# Patient Record
Sex: Female | Born: 1985 | Race: White | Hispanic: No | Marital: Married | Smoking: Never smoker
Health system: Southern US, Community
[De-identification: ages and names within clinical notes are randomized; demographics above are authoritative.]

## PROBLEM LIST (undated history)

## (undated) DIAGNOSIS — N200 Calculus of kidney: Secondary | ICD-10-CM

## (undated) DIAGNOSIS — R102 Pelvic and perineal pain: Secondary | ICD-10-CM

## (undated) DIAGNOSIS — Z87448 Personal history of other diseases of urinary system: Secondary | ICD-10-CM

## (undated) DIAGNOSIS — F419 Anxiety disorder, unspecified: Secondary | ICD-10-CM

## (undated) DIAGNOSIS — Z87440 Personal history of urinary (tract) infections: Secondary | ICD-10-CM

## (undated) DIAGNOSIS — D649 Anemia, unspecified: Secondary | ICD-10-CM

## (undated) DIAGNOSIS — G8929 Other chronic pain: Secondary | ICD-10-CM

## (undated) DIAGNOSIS — F32A Depression, unspecified: Secondary | ICD-10-CM

---
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
## 2007-01-05 ENCOUNTER — Other Ambulatory Visit: Admission: RE | Admit: 2007-01-05 | Discharge: 2007-01-05 | Payer: Self-pay | Admitting: Gynecology

## 2007-12-17 HISTORY — PX: AUGMENTATION MAMMAPLASTY: SUR837

## 2008-01-06 ENCOUNTER — Other Ambulatory Visit: Admission: RE | Admit: 2008-01-06 | Discharge: 2008-01-06 | Payer: Self-pay | Admitting: Gynecology

## 2010-03-15 ENCOUNTER — Emergency Department (HOSPITAL_COMMUNITY): Admission: EM | Admit: 2010-03-15 | Discharge: 2010-03-16 | Payer: Self-pay | Admitting: Emergency Medicine

## 2011-01-05 ENCOUNTER — Encounter: Payer: Self-pay | Admitting: Family Medicine

## 2011-05-03 NOTE — Op Note (Signed)
NAME:  Stacey Houston, Stacey Houston            ACCOUNT NO.:  [id number]   MEDICAL RECORD NO.:  [id number]          PATIENT TYPE:  AMB   LOCATION:  DAY                           FACILITY:  [facility]   PHYSICIAN:  Ky Barban, M.D.DATE OF BIRTH:  [birth year]   DATE OF PROCEDURE:  DATE OF DISCHARGE:                                 OPERATIVE REPORT   PREOPERATIVE DIAGNOSIS:  Frequent cystitis.   POSTOPERATIVE DIAGNOSIS:  Urethritis, urethral stenosis.   OPERATION PERFORMED:  Cystodilation.   SURGEON:  Ky Barban, M.D.   ANESTHESIA:   DESCRIPTION OF PROCEDURE:  The patient was placed in high lithotomy position  under general endotracheal anesthesia after the usual prep and drape. A #17  cystoscope introduced into the bladder to thoroughly inspect it.  No  evidence of any tumor or stone, foreign body or inflammation was seen.  Both  orifices located at normal site.  They were normal in location and shape.  Ureter showed some chronic inflammatory changes and calibrated to 17 [place].  Cystoscope was removed.  Urethra was dilated to 28 [place].  Patient left the  procedure room in satisfactory condition.     Moha   MIJ/MEDQ  D:  11/09/2004  T:  11/09/2004  Job:  [id number]

## 2011-05-03 NOTE — H&P (Signed)
Stacey Houston, Stacey Houston            ACCOUNT NO.:  [id number]   MEDICAL RECORD NO.:  [id number]          PATIENT TYPE:  AMB   LOCATION:  DAY                           FACILITY:  [facility]   PHYSICIAN:  Ky Barban, M.D.DATE OF BIRTH:  [birth year]   DATE OF ADMISSION:  11/09/2004  DATE OF DISCHARGE:  LH                                HISTORY & PHYSICAL   CHIEF COMPLAINT:  Recurrent cystitis.   HISTORY:  A 25 year old female was seen in the office on November 3,  complains of terminal dysuria, frequency.  No nocturia, hematuria, fever or  chills.  She had three episodes in the last three months, treated with  antibiotics.  As soon as she stops antibiotics, she has a problem again.  So  I have advised her to undergo cystoscopy, possible dilation under anesthesia  as an outpatient.   PAST MEDICAL HISTORY:  Essentially negative.   SOCIAL HISTORY:  She does not smoke or drink.   REVIEW OF SYSTEMS:  Unremarkable.   PHYSICAL EXAMINATION:  VITAL SIGNS: Blood pressure 112/78, temperature 98.  CENTRAL NERVOUS SYSTEM:  Negative.  HEAD AND NECK:  Negative.  CHEST:   PHYSICAL EXAMINATION:  VITAL SIGNS:  Blood pressure 130/90, temperature  98.4.  CENTRAL NERVOUS SYSTEM:  Negative.  HEAD AND NECK:  Negative.  CHEST:  Symmetrical.  HEART:  Regular sinus rhythm.  ABDOMEN:  Soft, flat.  Liver, spleen, and kidneys not palpable.  No CVA  tenderness.  PELVIC:  On inspection looks normal.   IMPRESSION:  Recurrent cystitis.   PLAN:  Cystoscopy, dilation under anesthesia as an outpatient.     Moha   MIJ/MEDQ  D:  11/06/2004  T:  11/06/2004  Job:  [id number]   cc:   [facility]  Fax: [phone]

## 2011-05-06 ENCOUNTER — Ambulatory Visit (HOSPITAL_COMMUNITY)
Admission: RE | Admit: 2011-05-06 | Discharge: 2011-05-06 | Disposition: A | Discharge status: Home / Self Care | Payer: PRIVATE HEALTH INSURANCE | Source: Ambulatory Visit | Attending: Internal Medicine | Admitting: Internal Medicine

## 2011-05-06 ENCOUNTER — Other Ambulatory Visit (HOSPITAL_COMMUNITY): Payer: Self-pay | Admitting: Internal Medicine

## 2011-05-06 DIAGNOSIS — R1031 Right lower quadrant pain: Secondary | ICD-10-CM

## 2011-05-06 DIAGNOSIS — N200 Calculus of kidney: Secondary | ICD-10-CM | POA: Insufficient documentation

## 2014-08-28 ENCOUNTER — Encounter (HOSPITAL_COMMUNITY): Payer: Self-pay | Admitting: Emergency Medicine

## 2014-08-28 ENCOUNTER — Emergency Department (HOSPITAL_COMMUNITY)
Admission: EM | Admit: 2014-08-28 | Discharge: 2014-08-28 | Disposition: A | Discharge status: Home / Self Care | Payer: PRIVATE HEALTH INSURANCE | Attending: Emergency Medicine | Admitting: Emergency Medicine

## 2014-08-28 DIAGNOSIS — Z880 Allergy status to penicillin: Secondary | ICD-10-CM | POA: Diagnosis not present

## 2014-08-28 DIAGNOSIS — N390 Urinary tract infection, site not specified: Secondary | ICD-10-CM | POA: Diagnosis not present

## 2014-08-28 DIAGNOSIS — R109 Unspecified abdominal pain: Secondary | ICD-10-CM | POA: Diagnosis present

## 2014-08-28 DIAGNOSIS — Z79899 Other long term (current) drug therapy: Secondary | ICD-10-CM | POA: Insufficient documentation

## 2014-08-28 LAB — URINE MICROSCOPIC-ADD ON

## 2014-08-28 LAB — URINALYSIS, ROUTINE W REFLEX MICROSCOPIC
Bilirubin Urine: NEGATIVE
Glucose, UA: NEGATIVE mg/dL
Ketones, ur: 15 mg/dL — AB
NITRITE: NEGATIVE
PROTEIN: NEGATIVE mg/dL
Specific Gravity, Urine: 1.023 (ref 1.005–1.030)
Urobilinogen, UA: 0.2 mg/dL (ref 0.0–1.0)
pH: 6 (ref 5.0–8.0)

## 2014-08-28 MED ORDER — FLUCONAZOLE 200 MG PO TABS: 200.0000 mg | ORAL_TABLET | Freq: Every day | ORAL | Status: AC

## 2014-08-28 MED ORDER — CEPHALEXIN 500 MG PO CAPS: 500.0000 mg | ORAL_CAPSULE | Freq: Two times a day (BID) | ORAL | Status: DC

## 2014-08-28 NOTE — ED Provider Notes (Signed)
CSN: [id number]     Arrival date & time 08/28/14  7425 History   First MD Initiated Contact with Patient 08/28/14 [phone]     Chief Complaint  Patient presents with  . Abdominal Pain      HPI Stacey Houston is a 28 y.o. female with PMH of persistent UTI in past presenting with severe suprapubic pain with burning after urination this morning. Patient denies hematuria or frequency. Patient has urinated since without the severe pain. Patient still has pain but it is mild. No back pain or flank pain or fevers. Patient has seen urology before and chronically has hematuria. Last time she was seen by them was 3 years ago and hasn't had UTI since. No pelvic pain or discharge. Mild headache that developed gradually. No lightheadedness or dizziness and patient ambulates normally.    History reviewed. No pertinent past medical history. History reviewed. No pertinent past surgical history. No family history on file. History  Substance Use Topics  . Smoking status: Never Smoker   . Smokeless tobacco: Not on file  . Alcohol Use: No   OB History   Grav Para Term Preterm Abortions TAB SAB Ect Mult Living                 Review of Systems  Constitutional: Negative for fever and chills.  HENT: Negative for congestion and rhinorrhea.   Eyes: Negative for visual disturbance.  Respiratory: Negative for cough and shortness of breath.   Cardiovascular: Negative for chest pain and palpitations.  Gastrointestinal: Negative for nausea, vomiting and diarrhea.  Genitourinary: Positive for dysuria. Negative for hematuria and flank pain.  Musculoskeletal: Negative for back pain and gait problem.  Skin: Negative for rash.  Neurological: Negative for weakness and headaches.      Allergies  Amoxicillin; Macrobid; and Sulfur  Home Medications   Prior to Admission medications   Medication Sig Start Date End Date Taking? Authorizing Provider  levonorgestrel-ethinyl estradiol  (SEASONALE,INTROVALE,JOLESSA) 0.15-0.03 MG tablet Take 1 tablet by mouth daily.   Yes Historical Provider, MD  cephALEXin (KEFLEX) 500 MG capsule Take 1 capsule (500 mg total) by mouth 2 (two) times daily. 08/28/14   Louann Sjogren, PA-C  fluconazole (DIFLUCAN) 200 MG tablet Take 1 tablet (200 mg total) by mouth daily. 08/28/14 09/04/14  Louann Sjogren, PA-C   BP 108/65  Pulse 89  Temp(Src) 97.9 F (36.6 C)  Resp 16  Ht 5' 4.5" (1.638 m)  Wt 128 lb (58.06 kg)  BMI 21.64 kg/m2  SpO2 98% Physical Exam  Nursing note and vitals reviewed. Constitutional: She appears well-developed and well-nourished. No distress.  HENT:  Head: Normocephalic and atraumatic.  Eyes: Conjunctivae and EOM are normal. Right eye exhibits no discharge. Left eye exhibits no discharge. No scleral icterus.  Cardiovascular: Normal rate, regular rhythm and normal heart sounds.   Pulmonary/Chest: Effort normal and breath sounds normal. No respiratory distress. She has no wheezes.  Abdominal: Soft. Bowel sounds are normal. She exhibits no distension.  Suprapubic tenderness to palpation without rebound or guarding.   Musculoskeletal: Normal range of motion. She exhibits no tenderness.  No midline, L or R sided back pain. No CVA tenderness.  Neurological: She is alert. She exhibits normal muscle tone. Coordination normal.  Patient ambulates normally.  Skin: Skin is warm and dry. She is not diaphoretic.  Psychiatric: She has a normal mood and affect. Her behavior is normal.    ED Course  Procedures (including critical care time) Labs Review  Labs Reviewed  URINALYSIS, ROUTINE W REFLEX MICROSCOPIC - Abnormal; Notable for the following:    Color, Urine AMBER (*)    APPearance CLOUDY (*)    Hgb urine dipstick MODERATE (*)    Ketones, ur 15 (*)    Leukocytes, UA MODERATE (*)    All other components within normal limits  URINE MICROSCOPIC-ADD ON - Abnormal; Notable for the following:    Squamous Epithelial / LPF FEW  (*)    Bacteria, UA MANY (*)    All other components within normal limits  POC URINE PREG, ED    Imaging Review No results found.   EKG Interpretation None     Meds given in ED:  Medications - No data to display  New Prescriptions   CEPHALEXIN (KEFLEX) 500 MG CAPSULE    Take 1 capsule (500 mg total) by mouth 2 (two) times daily.   FLUCONAZOLE (DIFLUCAN) 200 MG TABLET    Take 1 tablet (200 mg total) by mouth daily.      MDM   Final diagnoses:  UTI (lower urinary tract infection)   Pt has been diagnosed with a UTI. VSS. Pt is afebrile, no CVA tenderness, normotensive, and denies N/V. Pt to be dc home with antibiotics and fluconazole and instructions to follow up with PCP if symptoms persist.  Discussed return precautions with patient. Discussed all results and patient verbalizes understanding and agrees with plan.  Case has been discussed with Dr. Ethelda Chick who agrees with the above plan to discharge.      Louann Sjogren, PA-C 08/28/14 1058

## 2014-08-28 NOTE — Discharge Instructions (Signed)
Return to the emergency room with worsening of symptoms, new symptoms or with symptoms that are concerning, especially fevers or severe abdominal or back pain.  Please call your doctor for a followup appointment within 24-48 hours. When you talk to your doctor please let them know that you were seen in the emergency department and have them acquire all of your records so that they can discuss the findings with you and formulate a treatment plan to fully care for your new and ongoing problems. Please take all of your antibiotics until finished!   You may develop abdominal discomfort or diarrhea from the antibiotic.  You may help offset this with probiotics which you can buy or get in yogurt. Do not eat  or take the probiotics until 2 hours after your antibiotic.

## 2014-08-28 NOTE — ED Notes (Signed)
Pt. Stated, I went to pee this morning and my bladder started hurting and now I don't want to pee

## 2014-08-28 NOTE — ED Provider Notes (Signed)
Medical screening examination/treatment/procedure(s) were performed by non-physician practitioner and as supervising physician I was immediately available for consultation/collaboration.   EKG Interpretation None       Doug Sou, MD 08/28/14 1652

## 2014-11-05 ENCOUNTER — Emergency Department (HOSPITAL_COMMUNITY): Payer: No Typology Code available for payment source

## 2014-11-05 ENCOUNTER — Emergency Department (HOSPITAL_COMMUNITY)
Admission: EM | Admit: 2014-11-05 | Discharge: 2014-11-05 | Disposition: A | Discharge status: Home / Self Care | Payer: No Typology Code available for payment source | Attending: Emergency Medicine | Admitting: Emergency Medicine

## 2014-11-05 ENCOUNTER — Encounter (HOSPITAL_COMMUNITY): Payer: Self-pay | Admitting: Emergency Medicine

## 2014-11-05 DIAGNOSIS — Y998 Other external cause status: Secondary | ICD-10-CM | POA: Diagnosis not present

## 2014-11-05 DIAGNOSIS — Y92410 Unspecified street and highway as the place of occurrence of the external cause: Secondary | ICD-10-CM | POA: Insufficient documentation

## 2014-11-05 DIAGNOSIS — Z880 Allergy status to penicillin: Secondary | ICD-10-CM | POA: Insufficient documentation

## 2014-11-05 DIAGNOSIS — S3992XA Unspecified injury of lower back, initial encounter: Secondary | ICD-10-CM | POA: Diagnosis present

## 2014-11-05 DIAGNOSIS — Y9389 Activity, other specified: Secondary | ICD-10-CM | POA: Diagnosis not present

## 2014-11-05 DIAGNOSIS — S39012A Strain of muscle, fascia and tendon of lower back, initial encounter: Secondary | ICD-10-CM | POA: Diagnosis not present

## 2014-11-05 DIAGNOSIS — Z3202 Encounter for pregnancy test, result negative: Secondary | ICD-10-CM | POA: Diagnosis not present

## 2014-11-05 DIAGNOSIS — Z79899 Other long term (current) drug therapy: Secondary | ICD-10-CM | POA: Diagnosis not present

## 2014-11-05 DIAGNOSIS — Z792 Long term (current) use of antibiotics: Secondary | ICD-10-CM | POA: Diagnosis not present

## 2014-11-05 LAB — POC URINE PREG, ED: Preg Test, Ur: NEGATIVE

## 2014-11-05 MED ORDER — CYCLOBENZAPRINE HCL 5 MG PO TABS: 5.0000 mg | ORAL_TABLET | Freq: Three times a day (TID) | ORAL | Status: DC | PRN

## 2014-11-05 MED ORDER — HYDROCODONE-ACETAMINOPHEN 5-325 MG PO TABS: 1.0000 | ORAL_TABLET | ORAL | Status: DC | PRN
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
## 2015-01-10 ENCOUNTER — Encounter: Payer: Self-pay | Admitting: Family Medicine

## 2015-01-10 ENCOUNTER — Ambulatory Visit (INDEPENDENT_AMBULATORY_CARE_PROVIDER_SITE_OTHER): Payer: PRIVATE HEALTH INSURANCE | Admitting: Family Medicine

## 2015-01-10 VITALS — BP 100/60 | HR 86 | Temp 98.5°F | Ht 65.0 in | Wt 140.5 lb

## 2015-01-10 DIAGNOSIS — Z13220 Encounter for screening for lipoid disorders: Secondary | ICD-10-CM

## 2015-01-10 DIAGNOSIS — K5900 Constipation, unspecified: Secondary | ICD-10-CM

## 2015-01-10 HISTORY — DX: Constipation, unspecified: K59.00

## 2015-01-10 LAB — CBC WITH DIFFERENTIAL/PLATELET
BASOS PCT: 0.4 % (ref 0.0–3.0)
Basophils Absolute: 0 10*3/uL (ref 0.0–0.1)
EOS ABS: 0.2 10*3/uL (ref 0.0–0.7)
Eosinophils Relative: 1.9 % (ref 0.0–5.0)
HEMATOCRIT: 42.1 % (ref 36.0–46.0)
Hemoglobin: 14.2 g/dL (ref 12.0–15.0)
LYMPHS ABS: 2.6 10*3/uL (ref 0.7–4.0)
LYMPHS PCT: 23.4 % (ref 12.0–46.0)
MCHC: 33.8 g/dL (ref 30.0–36.0)
MCV: 89.8 fl (ref 78.0–100.0)
MONO ABS: 0.5 10*3/uL (ref 0.1–1.0)
Monocytes Relative: 4.9 % (ref 3.0–12.0)
Neutro Abs: 7.6 10*3/uL (ref 1.4–7.7)
Neutrophils Relative %: 69.4 % (ref 43.0–77.0)
PLATELETS: 384 10*3/uL (ref 150.0–400.0)
RBC: 4.69 Mil/uL (ref 3.87–5.11)
RDW: 13.3 % (ref 11.5–15.5)
WBC: 11 10*3/uL — AB (ref 4.0–10.5)

## 2015-01-10 LAB — COMPREHENSIVE METABOLIC PANEL
ALT: 11 U/L (ref 0–35)
AST: 13 U/L (ref 0–37)
Albumin: 4 g/dL (ref 3.5–5.2)
Alkaline Phosphatase: 44 U/L (ref 39–117)
BUN: 10 mg/dL (ref 6–23)
CHLORIDE: 105 meq/L (ref 96–112)
CO2: 25 meq/L (ref 19–32)
CREATININE: 0.7 mg/dL (ref 0.40–1.20)
Calcium: 9.1 mg/dL (ref 8.4–10.5)
GFR: 106.99 mL/min (ref >60.00)
GLUCOSE: 89 mg/dL (ref 70–99)
Potassium: 3.9 mEq/L (ref 3.5–5.1)
Sodium: 138 mEq/L (ref 135–145)
Total Bilirubin: 0.6 mg/dL (ref 0.2–1.2)
Total Protein: 6.9 g/dL (ref 6.0–8.3)

## 2015-01-10 LAB — TSH: TSH: 1.23 u[IU]/mL (ref 0.35–4.50)

## 2015-01-10 LAB — LIPID PANEL
CHOLESTEROL: 174 mg/dL (ref 0–200)
HDL: 55.6 mg/dL (ref >39.00)
LDL CALC: 98 mg/dL (ref 0–99)
NONHDL: 118.4
Total CHOL/HDL Ratio: 3
Triglycerides: 102 mg/dL (ref 0.0–149.0)
VLDL: 20.4 mg/dL (ref 0.0–40.0)

## 2015-01-10 LAB — T4, FREE: Free T4: 0.86 ng/dL (ref 0.60–1.60)

## 2015-01-10 LAB — T3, FREE: T3, Free: 3.8 pg/mL (ref 2.3–4.2)

## 2015-01-10 NOTE — Progress Notes (Signed)
Subjective:   Patient ID: Stacey Houston, female    DOB: Sep 28, 1986, 29 y.o.   MRN: [id number]  Stacey Houston is a pleasant 29 y.o. year old female who presents to clinic today with Establish Care  on 01/10/2015  HPI: Constipation- ongoing issue for several years.  Has tried probiotics and fiber without much improvement.  Used to have at least one BM per day, now can go days without having a BM and usually has to strain. No blood or mucous in stool. No abdominal pain. Not very physically active but does try to drink water all day at work.  No new rx- on OCPs (sees GYN) but has been since she was 29 yo.  She is thinking about coming off OCPS soon as she and her husband want to start a family.  Does have FH of IBS.  No known FH of thyroid dysfunction or IBD.  Current Outpatient Prescriptions on File Prior to Visit  Medication Sig Dispense Refill  . levonorgestrel-ethinyl estradiol (SEASONALE,INTROVALE,JOLESSA) 0.15-0.03 MG tablet Take 1 tablet by mouth daily.    . cyclobenzaprine (FLEXERIL) 5 MG tablet Take 1 tablet (5 mg total) by mouth 3 (three) times daily as needed for muscle spasms. (Patient not taking: Reported on 01/10/2015) 15 tablet 0  . HYDROcodone-acetaminophen (NORCO/VICODIN) 5-325 MG per tablet Take 1 tablet by mouth every 4 (four) hours as needed. (Patient not taking: Reported on 01/10/2015) 15 tablet 0   No current facility-administered medications on file prior to visit.    Allergies  Allergen Reactions  . Amoxicillin Hives  . Macrobid [employer][Nitrofurantoin Monohyd Macro] Other (See Comments)    Chest pain  . Penicillins     Hives   . Sulfur     childhood    History reviewed. No pertinent past medical history.  Past Surgical History  Procedure Laterality Date  . Augmentation mammaplasty      History reviewed. No pertinent family history.  History   Social History  . Marital Status: Married    Spouse Name: N/A    Number of Children: N/A  .  Years of Education: N/A   Occupational History  . Not on file.   Social History Main Topics  . Smoking status: Never Smoker   . Smokeless tobacco: Never Used  . Alcohol Use: No  . Drug Use: No  . Sexual Activity: Yes    Birth Control/ Protection: Pill   Other Topics Concern  . Not on file   Social History Narrative   The PMH, PSH, Social History, Family History, Medications, and allergies [facility], and have been updated if relevant.     Review of Systems  Constitutional: Positive for unexpected weight change. Negative for fever, appetite change and fatigue.  Respiratory: Negative.   Cardiovascular: Negative.   Gastrointestinal: Positive for constipation. Negative for nausea, vomiting, abdominal pain, diarrhea, blood in stool, abdominal distention, anal bleeding and rectal pain.  Endocrine: Negative for cold intolerance, heat intolerance, polydipsia, polyphagia and polyuria.  Genitourinary: Negative.   Musculoskeletal: Negative.   Skin: Negative.   Neurological: Negative.   Hematological: Negative.   Psychiatric/Behavioral: Negative.   All other systems reviewed and are negative.      Objective:    BP 100/60 mmHg  Pulse 86  Temp(Src) 98.5 F (36.9 C) (Oral)  Ht 5\' 5"  (1.651 m)  Wt 140 lb 8 oz (63.73 kg)  BMI 23.38 kg/m2  LMP 12/20/2014   Physical Exam  Constitutional: She is oriented  to person, place, and time. She appears well-developed and well-nourished. No distress.  HENT:  Head: Normocephalic and atraumatic.  Eyes: Pupils are equal, round, and reactive to light.  Neck: Normal range of motion. Neck supple. No thyromegaly present.  Cardiovascular: Normal rate and regular rhythm.   Pulmonary/Chest: Effort normal.  Abdominal: Soft. Bowel sounds are normal. She exhibits no distension and no mass. There is no tenderness. There is no rebound and no guarding.  Musculoskeletal: Normal range of motion.  Neurological: She is alert and oriented to  person, place, and time. No cranial nerve deficit.  Skin: Skin is warm and dry.  Psychiatric: She has a normal mood and affect. Her behavior is normal. Judgment and thought content normal.  Nursing note and vitals reviewed.         Assessment & Plan:   Constipation, unspecified constipation type - Plan: T4, Free, TSH, T3, Free, Comprehensive metabolic panel, CBC with Differential/Platelet  Screening, lipid - Plan: Lipid panel No Follow-up on file.

## 2015-01-10 NOTE — Progress Notes (Signed)
Pre visit review using our clinic review tool, if applicable. No additional management support is needed unless otherwise documented below in the visit note. 

## 2015-01-10 NOTE — Patient Instructions (Signed)
Great to meet you. Please start taking Miralax over the counter- 17 grams daily or every other day.  Ok to go slow. We will call you with your lab results and you can view them online.

## 2015-01-10 NOTE — Assessment & Plan Note (Signed)
New- persistent. ?IBS with constipation.  No red flag symptoms. >25 minutes spent in face to face time with patient, >50% spent in counselling or coordination of care Discussed different types of constipation with pt.  Advised that she STOP fiber supplement as it can worsen constipation in certain circumstances, particularly without adequate hydration. Check labs today. Start miralax- 17 g daily or every other day- start slow. Call or return to clinic prn if these symptoms worsen or fail to improve as anticipated. The patient indicates understanding of these issues and agrees with the plan.  Orders Placed This Encounter  Procedures  . T4, Free  . TSH  . T3, Free  . Comprehensive metabolic panel  . CBC with Differential/Platelet  . Lipid panel

## 2015-01-11 ENCOUNTER — Encounter: Payer: Self-pay | Admitting: *Deleted
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
## 2016-02-27 ENCOUNTER — Telehealth: Payer: Self-pay

## 2016-02-27 NOTE — Telephone Encounter (Signed)
Pt left v/m requesting rx for sea sick patch; pt is going on cruise. Left v/m requesting cb.

## 2016-03-16 HISTORY — PX: WISDOM TOOTH EXTRACTION: SHX21

## 2016-04-04 NOTE — Telephone Encounter (Signed)
I spoke with pt and she did not need the patches; pt has already been on cruise and had no problem. Advised pt had requested cb to see how many days would be at sea to determine quantity sent to pharmacy. Pt voiced understanding but said that was OK.

## 2017-08-01 ENCOUNTER — Encounter (HOSPITAL_BASED_OUTPATIENT_CLINIC_OR_DEPARTMENT_OTHER): Payer: Self-pay | Admitting: *Deleted

## 2017-08-01 NOTE — Progress Notes (Signed)
NPO AFTER MN.  ARRIVE AT 0600.  NEEDS URINE PREG.  GETTING LAB WORK DONE PRIOR TO DOS.  ( CBC, CMET)

## 2017-08-07 DIAGNOSIS — R102 Pelvic and perineal pain: Secondary | ICD-10-CM | POA: Diagnosis not present

## 2017-08-07 DIAGNOSIS — N941 Unspecified dyspareunia: Secondary | ICD-10-CM | POA: Diagnosis not present

## 2017-08-07 DIAGNOSIS — N946 Dysmenorrhea, unspecified: Secondary | ICD-10-CM | POA: Diagnosis present

## 2017-08-07 DIAGNOSIS — N803 Endometriosis of pelvic peritoneum: Secondary | ICD-10-CM | POA: Diagnosis not present

## 2017-08-07 DIAGNOSIS — G8929 Other chronic pain: Secondary | ICD-10-CM | POA: Diagnosis not present

## 2017-08-07 LAB — COMPREHENSIVE METABOLIC PANEL
ALK PHOS: 52 U/L (ref 38–126)
ALT: 16 U/L (ref 14–54)
AST: 17 U/L (ref 15–41)
Albumin: 4.7 g/dL (ref 3.5–5.0)
Anion gap: 7 (ref 5–15)
BUN: 9 mg/dL (ref 6–20)
CALCIUM: 9.2 mg/dL (ref 8.9–10.3)
CO2: 25 mmol/L (ref 22–32)
CREATININE: 0.63 mg/dL (ref 0.44–1.00)
Chloride: 106 mmol/L (ref 101–111)
Glucose, Bld: 86 mg/dL (ref 65–99)
Potassium: 3.3 mmol/L — ABNORMAL LOW (ref 3.5–5.1)
Sodium: 138 mmol/L (ref 135–145)
TOTAL PROTEIN: 7.3 g/dL (ref 6.5–8.1)
Total Bilirubin: 0.9 mg/dL (ref 0.3–1.2)

## 2017-08-07 LAB — CBC
HCT: 38.4 % (ref 36.0–46.0)
Hemoglobin: 13.3 g/dL (ref 12.0–15.0)
MCH: 30.1 pg (ref 26.0–34.0)
MCHC: 34.6 g/dL (ref 30.0–36.0)
MCV: 86.9 fL (ref 78.0–100.0)
PLATELETS: 318 10*3/uL (ref 150–400)
RBC: 4.42 MIL/uL (ref 3.87–5.11)
RDW: 12.1 % (ref 11.5–15.5)
WBC: 9.6 10*3/uL (ref 4.0–10.5)

## 2017-08-10 NOTE — H&P (Signed)
Stacey Houston is an 31 y.o. female with severe dysmenorrhea and dyspareunia which has worsened since coming off OCPs; attempting to conceive since October.  The patient was recently diagnosed with IC by urology and has noticed improvement with diet and medication.  She desires definitive diagnosis.  Pertinent Gynecological History: Menses: flow is moderate Bleeding: regular Contraception: none DES exposure: unknown Blood transfusions: none Sexually transmitted diseases: no past history Previous GYN Procedures: n/a  Last mammogram: none Date: n/a Last pap: normal Date: 02/2017 OB History: G0   Menstrual History: Menarche age: n/a Patient's last menstrual period was 07/07/2017 (approximate).    Past Medical History:  Diagnosis Date  . Chronic pelvic pain in female   . H/O chronic cystitis   . History of recurrent UTIs   . History of urethral stricture   . Nephrolithiasis    bilateral non-obstructive per urologist note 06-16-2017  -- dr Marlou Porch    Past Surgical History:  Procedure Laterality Date  . AUGMENTATION MAMMAPLASTY Bilateral 2009  . CYSTO/  URETHRAL DILATION  11/09/2004  . WISDOM TOOTH EXTRACTION  03/2016    History reviewed. No pertinent family history.  Social History:  reports that she has never smoked. She has never used smokeless tobacco. She reports that she does not drink alcohol or use drugs.  Allergies:  Allergies  Allergen Reactions  . Amoxicillin Hives  . Penicillins Hives  . Sulfa Antibiotics Hives    No prescriptions prior to admission.    ROS  Height 5' 4.5" (1.638 m), weight 128 lb (58.1 kg), last menstrual period 07/07/2017. Physical Exam  Constitutional: She is oriented to person, place, and time. She appears well-developed and well-nourished.  GI: Soft. There is no rebound and no guarding.  Neurological: She is alert and oriented to person, place, and time.  Skin: Skin is warm and dry.  Psychiatric: She has a normal mood and  affect. Her behavior is normal.    No results found for this or any previous visit (from the past 24 hour(s)).  No results found.  Assessment/Plan: Chronic pelvic pain Dx L/S, possible removal of endometriosis, chromopertubation Patient has been counseled re: risk of bleeding, infection, scarring, and damage to surrounding structures.  All questions were answered and the patient wishes to proceed.  Stacey Houston 08/10/2017, 9:12 PM

## 2017-08-12 ENCOUNTER — Ambulatory Visit (HOSPITAL_BASED_OUTPATIENT_CLINIC_OR_DEPARTMENT_OTHER): Payer: 59 | Admitting: Certified Registered"

## 2017-08-12 ENCOUNTER — Encounter (HOSPITAL_BASED_OUTPATIENT_CLINIC_OR_DEPARTMENT_OTHER)
Admission: RE | Disposition: A | Discharge status: Home / Self Care | Payer: Self-pay | Source: Ambulatory Visit | Attending: Obstetrics & Gynecology

## 2017-08-12 ENCOUNTER — Ambulatory Visit (HOSPITAL_BASED_OUTPATIENT_CLINIC_OR_DEPARTMENT_OTHER)
Admission: RE | Admit: 2017-08-12 | Discharge: 2017-08-12 | Disposition: A | Discharge status: Home / Self Care | Payer: 59 | Source: Ambulatory Visit | Attending: Obstetrics & Gynecology | Admitting: Obstetrics & Gynecology

## 2017-08-12 ENCOUNTER — Encounter (HOSPITAL_BASED_OUTPATIENT_CLINIC_OR_DEPARTMENT_OTHER): Payer: Self-pay | Admitting: *Deleted

## 2017-08-12 DIAGNOSIS — G8929 Other chronic pain: Secondary | ICD-10-CM | POA: Insufficient documentation

## 2017-08-12 DIAGNOSIS — R102 Pelvic and perineal pain: Secondary | ICD-10-CM | POA: Insufficient documentation

## 2017-08-12 DIAGNOSIS — N941 Unspecified dyspareunia: Secondary | ICD-10-CM | POA: Insufficient documentation

## 2017-08-12 DIAGNOSIS — N946 Dysmenorrhea, unspecified: Secondary | ICD-10-CM | POA: Insufficient documentation

## 2017-08-12 DIAGNOSIS — N803 Endometriosis of pelvic peritoneum: Secondary | ICD-10-CM | POA: Insufficient documentation

## 2017-08-12 HISTORY — DX: Personal history of urinary (tract) infections: Z87.440

## 2017-08-12 HISTORY — DX: Personal history of other diseases of urinary system: Z87.448

## 2017-08-12 HISTORY — DX: Calculus of kidney: N20.0

## 2017-08-12 HISTORY — PX: LAPAROSCOPY: SHX197

## 2017-08-12 HISTORY — DX: Other chronic pain: G89.29

## 2017-08-12 HISTORY — DX: Pelvic and perineal pain: R10.2

## 2017-08-12 HISTORY — PX: CHROMOPERTUBATION: SHX6288

## 2017-08-12 LAB — POCT PREGNANCY, URINE: Preg Test, Ur: NEGATIVE

## 2017-08-12 SURGERY — LAPAROSCOPY, DIAGNOSTIC
Anesthesia: General

## 2017-08-12 MED ORDER — IBUPROFEN 600 MG PO TABS: 600.0000 mg | ORAL_TABLET | Freq: Four times a day (QID) | ORAL | 0 refills | Status: DC | PRN

## 2017-08-12 MED ORDER — BUPIVACAINE HCL (PF) 0.25 % IJ SOLN
INTRAMUSCULAR | Status: DC | PRN
  Administered 2017-08-12: 10 mL

## 2017-08-12 MED ORDER — DEXAMETHASONE SODIUM PHOSPHATE 4 MG/ML IJ SOLN
INTRAMUSCULAR | Status: DC | PRN
  Administered 2017-08-12: 10 mg via INTRAVENOUS

## 2017-08-12 MED ORDER — SUGAMMADEX SODIUM 200 MG/2ML IV SOLN
INTRAVENOUS | Status: DC | PRN
  Administered 2017-08-12: 200 mg via INTRAVENOUS

## 2017-08-12 MED ORDER — ROCURONIUM BROMIDE 50 MG/5ML IV SOSY
PREFILLED_SYRINGE | INTRAVENOUS | Status: AC
  Filled 2017-08-12: qty 5

## 2017-08-12 MED ORDER — PROPOFOL 10 MG/ML IV BOLUS
INTRAVENOUS | Status: AC
  Filled 2017-08-12: qty 20

## 2017-08-12 MED ORDER — SUGAMMADEX SODIUM 200 MG/2ML IV SOLN
INTRAVENOUS | Status: AC
  Filled 2017-08-12: qty 2

## 2017-08-12 MED ORDER — OXYCODONE-ACETAMINOPHEN 5-325 MG PO TABS: 1.0000 | ORAL_TABLET | ORAL | 0 refills | Status: DC | PRN

## 2017-08-12 MED ORDER — LACTATED RINGERS IV SOLN
INTRAVENOUS | Status: DC
Start: 2017-08-12 — End: 2017-08-12
  Filled 2017-08-12: qty 1000

## 2017-08-12 MED ORDER — ONDANSETRON HCL 4 MG/2ML IJ SOLN
INTRAMUSCULAR | Status: AC
  Filled 2017-08-12: qty 2

## 2017-08-12 MED ORDER — KETOROLAC TROMETHAMINE 30 MG/ML IJ SOLN
INTRAMUSCULAR | Status: DC | PRN
  Administered 2017-08-12: 30 mg via INTRAVENOUS

## 2017-08-12 MED ORDER — ROCURONIUM BROMIDE 50 MG/5ML IV SOSY
PREFILLED_SYRINGE | INTRAVENOUS | Status: DC | PRN
  Administered 2017-08-12: 40 mg via INTRAVENOUS

## 2017-08-12 MED ORDER — LIDOCAINE 2% (20 MG/ML) 5 ML SYRINGE
INTRAMUSCULAR | Status: DC | PRN
  Administered 2017-08-12: 60 mg via INTRAVENOUS

## 2017-08-12 MED ORDER — OXYCODONE HCL 5 MG PO TABS
5.0000 mg | ORAL_TABLET | Freq: Once | ORAL | Status: DC | PRN
  Filled 2017-08-12: qty 1

## 2017-08-12 MED ORDER — PROPOFOL 10 MG/ML IV BOLUS
INTRAVENOUS | Status: DC | PRN
  Administered 2017-08-12: 160 mg via INTRAVENOUS

## 2017-08-12 MED ORDER — FENTANYL CITRATE (PF) 100 MCG/2ML IJ SOLN
25.0000 ug | INTRAMUSCULAR | Status: DC | PRN
  Administered 2017-08-12: 25 ug via INTRAVENOUS
  Filled 2017-08-12: qty 1

## 2017-08-12 MED ORDER — MIDAZOLAM HCL 2 MG/2ML IJ SOLN
INTRAMUSCULAR | Status: AC
  Filled 2017-08-12: qty 2

## 2017-08-12 MED ORDER — KETOROLAC TROMETHAMINE 30 MG/ML IJ SOLN
INTRAMUSCULAR | Status: AC
  Filled 2017-08-12: qty 1

## 2017-08-12 MED ORDER — LIDOCAINE 2% (20 MG/ML) 5 ML SYRINGE
INTRAMUSCULAR | Status: AC
  Filled 2017-08-12: qty 5

## 2017-08-12 MED ORDER — PROMETHAZINE HCL 25 MG/ML IJ SOLN
INTRAMUSCULAR | Status: AC
Start: 2017-08-12 — End: 2017-08-12
  Filled 2017-08-12: qty 1

## 2017-08-12 MED ORDER — KETOROLAC TROMETHAMINE 30 MG/ML IJ SOLN
30.0000 mg | Freq: Once | INTRAMUSCULAR | Status: DC | PRN
  Filled 2017-08-12: qty 1

## 2017-08-12 MED ORDER — FENTANYL CITRATE (PF) 100 MCG/2ML IJ SOLN
INTRAMUSCULAR | Status: AC
  Filled 2017-08-12: qty 2

## 2017-08-12 MED ORDER — PROMETHAZINE HCL 25 MG/ML IJ SOLN
6.2500 mg | INTRAMUSCULAR | Status: AC | PRN
  Administered 2017-08-12: 6.25 mg via INTRAVENOUS
  Administered 2017-08-12: 12.5 mg via INTRAVENOUS
  Filled 2017-08-12: qty 1

## 2017-08-12 MED ORDER — FENTANYL CITRATE (PF) 100 MCG/2ML IJ SOLN
INTRAMUSCULAR | Status: DC | PRN
  Administered 2017-08-12 (×2): 25 ug via INTRAVENOUS
  Administered 2017-08-12: 50 ug via INTRAVENOUS

## 2017-08-12 MED ORDER — DEXAMETHASONE SODIUM PHOSPHATE 10 MG/ML IJ SOLN
INTRAMUSCULAR | Status: AC
  Filled 2017-08-12: qty 1

## 2017-08-12 MED ORDER — FENTANYL CITRATE (PF) 100 MCG/2ML IJ SOLN
INTRAMUSCULAR | Status: AC
Start: 2017-08-12 — End: 2017-08-12
  Filled 2017-08-12: qty 2

## 2017-08-12 MED ORDER — LACTATED RINGERS IV SOLN
INTRAVENOUS | Status: DC
  Administered 2017-08-12: 500 mL via INTRAVENOUS
  Administered 2017-08-12 (×2): via INTRAVENOUS
  Filled 2017-08-12 (×2): qty 1000

## 2017-08-12 MED ORDER — MIDAZOLAM HCL 5 MG/5ML IJ SOLN
INTRAMUSCULAR | Status: DC | PRN
  Administered 2017-08-12: 2 mg via INTRAVENOUS

## 2017-08-12 MED ORDER — OXYCODONE HCL 5 MG/5ML PO SOLN
5.0000 mg | Freq: Once | ORAL | Status: DC | PRN
  Filled 2017-08-12: qty 5

## 2017-08-12 MED ORDER — ONDANSETRON HCL 4 MG/2ML IJ SOLN
INTRAMUSCULAR | Status: DC | PRN
  Administered 2017-08-12: 4 mg via INTRAVENOUS

## 2017-08-12 SURGICAL SUPPLY — 54 items
APPLICATOR COTTON TIP 6IN STRL (MISCELLANEOUS) IMPLANT
BARRIER ADHS 3X4 INTERCEED (GAUZE/BANDAGES/DRESSINGS) IMPLANT
BENZOIN TINCTURE PRP APPL 2/3 (GAUZE/BANDAGES/DRESSINGS) IMPLANT
CANISTER SUCT 3000ML PPV (MISCELLANEOUS) IMPLANT
CANISTER SUCTION 1200CC (MISCELLANEOUS) IMPLANT
CATH ROBINSON RED A/P 14FR (CATHETERS) ×3 IMPLANT
CLOSURE WOUND 1/2 X4 (GAUZE/BANDAGES/DRESSINGS)
CLOSURE WOUND 1/4X4 (GAUZE/BANDAGES/DRESSINGS)
COVER MAYO STAND STRL (DRAPES) ×3 IMPLANT
DERMABOND ADVANCED (GAUZE/BANDAGES/DRESSINGS)
DERMABOND ADVANCED .7 DNX12 (GAUZE/BANDAGES/DRESSINGS) IMPLANT
DRSG OPSITE POSTOP 3X4 (GAUZE/BANDAGES/DRESSINGS) ×6 IMPLANT
DURAPREP 26ML APPLICATOR (WOUND CARE) ×3 IMPLANT
ELECT REM PT RETURN 9FT ADLT (ELECTROSURGICAL) ×3
ELECTRODE REM PT RTRN 9FT ADLT (ELECTROSURGICAL) ×1 IMPLANT
GLOVE BIO SURGEON STRL SZ 6 (GLOVE) ×3 IMPLANT
GLOVE BIOGEL PI IND STRL 6 (GLOVE) ×2 IMPLANT
GLOVE BIOGEL PI INDICATOR 6 (GLOVE) ×4
GOWN STRL REUS W/ TWL LRG LVL3 (GOWN DISPOSABLE) ×3 IMPLANT
GOWN STRL REUS W/TWL LRG LVL3 (GOWN DISPOSABLE) ×6
IV LACTATED RINGER IRRG 3000ML (IV SOLUTION) ×2
IV LR IRRIG 3000ML ARTHROMATIC (IV SOLUTION) ×1 IMPLANT
KIT RM TURNOVER CYSTO AR (KITS) ×3 IMPLANT
LIGASURE LAP L-HOOKWIRE 5 44CM (INSTRUMENTS) IMPLANT
MANIFOLD NEPTUNE II (INSTRUMENTS) IMPLANT
NEEDLE INSUFFLATION 120MM (ENDOMECHANICALS) ×3 IMPLANT
NS IRRIG 1000ML POUR BTL (IV SOLUTION) ×3 IMPLANT
NS IRRIG 500ML POUR BTL (IV SOLUTION) ×3 IMPLANT
PACK LAPAROSCOPY BASIN (CUSTOM PROCEDURE TRAY) ×3 IMPLANT
PACK TRENDGUARD 450 HYBRID PRO (MISCELLANEOUS) ×1 IMPLANT
PACK TRENDGUARD 600 HYBRD PROC (MISCELLANEOUS) IMPLANT
PAD OB MATERNITY 4.3X12.25 (PERSONAL CARE ITEMS) ×3 IMPLANT
PENCIL BUTTON HOLSTER BLD 10FT (ELECTRODE) IMPLANT
POUCH SPECIMEN RETRIEVAL 10MM (ENDOMECHANICALS) IMPLANT
SCISSORS LAP 5X35 DISP (ENDOMECHANICALS) ×3 IMPLANT
SEALER TISSUE G2 CVD JAW 45CM (ENDOMECHANICALS) IMPLANT
SET IRRIG TUBING LAPAROSCOPIC (IRRIGATION / IRRIGATOR) IMPLANT
STRIP CLOSURE SKIN 1/2X4 (GAUZE/BANDAGES/DRESSINGS) IMPLANT
STRIP CLOSURE SKIN 1/4X4 (GAUZE/BANDAGES/DRESSINGS) IMPLANT
SUT MNCRL AB 3-0 PS2 18 (SUTURE) ×3 IMPLANT
SUT VICRYL 0 UR6 27IN ABS (SUTURE) ×3 IMPLANT
SYR 20CC LL (SYRINGE) IMPLANT
SYRINGE 10CC LL (SYRINGE) ×3 IMPLANT
TOWEL OR 17X24 6PK STRL BLUE (TOWEL DISPOSABLE) ×6 IMPLANT
TRENDGUARD 450 HYBRID PRO PACK (MISCELLANEOUS) ×3
TRENDGUARD 600 HYBRID PROC PK (MISCELLANEOUS)
TROCAR XCEL NON-BLD 11X100MML (ENDOMECHANICALS) ×3 IMPLANT
TROCAR XCEL NON-BLD 5MMX100MML (ENDOMECHANICALS) ×3 IMPLANT
TUBE CONNECTING 12'X1/4 (SUCTIONS) ×1
TUBE CONNECTING 12X1/4 (SUCTIONS) ×2 IMPLANT
TUBING EXTENTION W/L.L. (IV SETS) ×3 IMPLANT
TUBING INSUF HEATED (TUBING) ×3 IMPLANT
WARMER LAPAROSCOPE (MISCELLANEOUS) ×3 IMPLANT
WATER STERILE IRR 500ML POUR (IV SOLUTION) IMPLANT

## 2017-08-12 NOTE — Discharge Instructions (Signed)
Post Anesthesia Home Care Instructions  Activity: Get plenty of rest for the remainder of the day. A responsible individual must stay with you for 24 hours following the procedure.  For the next 24 hours, DO NOT: -Drive a car -Advertising copywriter -Drink alcoholic beverages -Take any medication unless instructed by your physician -Make any legal decisions or sign important papers.  Meals: Start with liquid foods such as gelatin or soup. Progress to regular foods as tolerated. Avoid greasy, spicy, heavy foods. If nausea and/or vomiting occur, drink only clear liquids until the nausea and/or vomiting subsides. Call your physician if vomiting continues.  Special Instructions/Symptoms: Your throat may feel dry or sore from the anesthesia or the breathing tube placed in your throat during surgery. If this causes discomfort, gargle with warm salt water. The discomfort should disappear within 24 hours.  If you had a scopolamine patch placed behind your ear for the management of post- operative nausea and/or vomiting:  1. The medication in the patch is effective for 72 hours, after which it should be removed.  Wrap patch in a tissue and discard in the trash. Wash hands thoroughly with soap and water. 2. You may remove the patch earlier than 72 hours if you experience unpleasant side effects which may include dry mouth, dizziness or visual disturbances. 3. Avoid touching the patch. Wash your hands with soap and water after contact with the patch.      Call MD for T>100.4, severe abdominal pain, intractable nausea and/or vomiting, or respiratory distress. Call office to schedule postop appointment in 2 weeks.  No driving while taking narcotics.  Pelvic rest x 4 weeks.     DISCHARGE INSTRUCTIONS: Laparoscopy  The following instructions have been prepared to help you care for yourself upon your return home today.  Wound care:  Do not get the incision wet for the first 24 hours. The incision  should be kept clean and dry.  The Band-Aids or dressings may be removed the day after surgery.  Should the incision become sore, red, and swollen after the first week, check with your doctor.  Personal hygiene:  Shower the day after your procedure.  Activity and limitations:  Do NOT drive or operate any equipment today.  Do NOT lift anything more than 15 pounds for 2-3 weeks after surgery.  Do NOT rest in bed all day.  Walking is encouraged. Walk each day, starting slowly with 5-minute walks 3 or 4 times a day. Slowly increase the length of your walks.  Walk up and down stairs slowly.  Do NOT do strenuous activities, such as golfing, playing tennis, bowling, running, biking, weight lifting, gardening, mowing, or vacuuming for 2-4 weeks. Ask your doctor when it is okay to start.  Diet: Eat a light meal as desired this evening. You may resume your usual diet tomorrow.  Return to work: This is dependent on the type of work you do. For the most part you can return to a desk job within a week of surgery. If you are more active at work, please discuss this with your doctor.  What to expect after your surgery: You may have a slight burning sensation when you urinate on the first day. You may have a very small amount of blood in the urine. Expect to have a small amount of vaginal discharge/light bleeding for 1-2 weeks. It is not unusual to have abdominal soreness and bruising for up to 2 weeks. You may be tired and need more rest for about  1 week. You may experience shoulder pain for 24-72 hours. Lying flat in bed may relieve it.  Call your doctor for any of the following:  Develop a fever of 100.4 or greater  Inability to urinate 6 hours after discharge from hospital  Severe pain not relieved by pain medications  Persistent of heavy bleeding at incision site  Redness or swelling around incision site after a week  Increasing nausea or vomiting  Patient  Signature________________________________________ Nurse Signature_________________________________________
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
## 2017-08-12 NOTE — Anesthesia Procedure Notes (Signed)
Procedure Name: Intubation Date/Time: 08/12/2017 7:33 AM Performed by: Denna Haggard D Pre-anesthesia Checklist: Patient identified, Emergency Drugs available, Suction available and Patient being monitored Patient Re-evaluated:Patient Re-evaluated prior to induction Oxygen Delivery Method: Circle system utilized Preoxygenation: Pre-oxygenation with 100% oxygen Induction Type: IV induction Ventilation: Mask ventilation without difficulty Laryngoscope Size: Mac and 3 Grade View: Grade I Tube type: Oral Tube size: 7.0 mm Number of attempts: 1 Airway Equipment and Method: Stylet and Oral airway Placement Confirmation: ETT inserted through vocal cords under direct vision,  positive ETCO2 and breath sounds checked- equal and bilateral Secured at: 21 cm Tube secured with: Tape Dental Injury: Teeth and Oropharynx as per pre-operative assessment

## 2017-08-12 NOTE — Transfer of Care (Signed)
Immediate Anesthesia Transfer of Care Note  Patient: Stacey Houston  Procedure(s) Performed: Procedure(s) (LRB): LAPAROSCOPY DIAGNOSTIC REMOVAL OF ENDOMETRIOSI (N/A) CHROMOPERTUBATION (N/A)  Patient Location: PACU  Anesthesia Type: General  Level of Consciousness: awake, oriented, sedated and patient cooperative  Airway & Oxygen Therapy: Patient Spontanous Breathing and Patient connected to face mask oxygen  Post-op Assessment: Report given to PACU RN and Post -op Vital signs reviewed and stable  Post vital signs: Reviewed and stable  Complications: No apparent anesthesia complications  Last Vitals:  Vitals:   08/12/17 0604 08/12/17 0839  BP: 115/68 97/61  Pulse: 73 81  Resp: 14 20  Temp: 36.9 C 36.8 C  SpO2: 100% 100%    Last Pain:  Vitals:   08/12/17 0604  TempSrc: Oral      Patients Stated Pain Goal: 7 (08/12/17 7939)

## 2017-08-12 NOTE — Anesthesia Postprocedure Evaluation (Signed)
Anesthesia Post Note  Patient: Stacey Houston  Procedure(s) Performed: Procedure(s) (LRB): LAPAROSCOPY DIAGNOSTIC Fulgeration OF ENDOMETRIOSI (N/A) CHROMOPERTUBATION (N/A)     Patient location during evaluation: PACU Anesthesia Type: General Level of consciousness: awake and alert Pain management: pain level controlled Vital Signs Assessment: post-procedure vital signs reviewed and stable Respiratory status: spontaneous breathing, nonlabored ventilation, respiratory function stable and patient connected to nasal cannula oxygen Cardiovascular status: blood pressure returned to baseline and stable Postop Assessment: no signs of nausea or vomiting Anesthetic complications: no    Last Vitals:  Vitals:   08/12/17 0900 08/12/17 0915  BP: (!) 100/46 99/61  Pulse: (!) 59 62  Resp: 13 13  Temp:    SpO2: 100% 100%    Last Pain:  Vitals:   08/12/17 0915  TempSrc:   PainSc: 4                  Yoseph Haile S

## 2017-08-12 NOTE — Progress Notes (Signed)
No change to H&P. Chromopertubation was added to consent per discussion.  Patient, witness and myself initialed.   Mitchel Honour, DO

## 2017-08-12 NOTE — Anesthesia Preprocedure Evaluation (Signed)
Anesthesia Evaluation  Patient identified by MRN, date of birth, ID band Patient awake    Reviewed: Allergy & Precautions, NPO status , Patient's Chart, lab work & pertinent test results  Airway Mallampati: II  TM Distance: >3 FB Neck ROM: Full    Dental no notable dental hx.    Pulmonary neg pulmonary ROS,    Pulmonary exam normal breath sounds clear to auscultation       Cardiovascular negative cardio ROS Normal cardiovascular exam Rhythm:Regular Rate:Normal     Neuro/Psych negative neurological ROS  negative psych ROS   GI/Hepatic negative GI ROS, Neg liver ROS,   Endo/Other  negative endocrine ROS  Renal/GU negative Renal ROS  negative genitourinary   Musculoskeletal negative musculoskeletal ROS (+)   Abdominal   Peds negative pediatric ROS (+)  Hematology negative hematology ROS (+)   Anesthesia Other Findings   Reproductive/Obstetrics negative OB ROS                             Anesthesia Physical Anesthesia Plan  ASA: I  Anesthesia Plan: General   Post-op Pain Management:    Induction: Intravenous  PONV Risk Score and Plan: 2 and Ondansetron and Dexamethasone  Airway Management Planned: Oral ETT  Additional Equipment:   Intra-op Plan:   Post-operative Plan: Extubation in OR  Informed Consent: I have reviewed the patients History and Physical, chart, labs and discussed the procedure including the risks, benefits and alternatives for the proposed anesthesia with the patient or authorized representative who has indicated his/her understanding and acceptance.     Dental advisory given  Plan Discussed with: CRNA and Surgeon  Anesthesia Plan Comments:         Anesthesia Quick Evaluation  

## 2017-08-13 ENCOUNTER — Encounter (HOSPITAL_BASED_OUTPATIENT_CLINIC_OR_DEPARTMENT_OTHER): Payer: Self-pay | Admitting: Obstetrics & Gynecology

## 2018-04-07 LAB — OB RESULTS CONSOLE GC/CHLAMYDIA
CHLAMYDIA, DNA PROBE: NEGATIVE
GC PROBE AMP, GENITAL: NEGATIVE

## 2018-04-07 LAB — OB RESULTS CONSOLE RPR: RPR: NONREACTIVE

## 2018-04-07 LAB — OB RESULTS CONSOLE HEPATITIS B SURFACE ANTIGEN: Hepatitis B Surface Ag: NEGATIVE

## 2018-04-07 LAB — OB RESULTS CONSOLE RUBELLA ANTIBODY, IGM: RUBELLA: NON-IMMUNE/NOT IMMUNE

## 2018-04-07 LAB — OB RESULTS CONSOLE HIV ANTIBODY (ROUTINE TESTING): HIV: NONREACTIVE

## 2018-10-13 LAB — OB RESULTS CONSOLE GBS: GBS: NEGATIVE

## 2018-11-07 ENCOUNTER — Inpatient Hospital Stay (EMERGENCY_DEPARTMENT_HOSPITAL)
Admission: AD | Admit: 2018-11-07 | Discharge: 2018-11-07 | Disposition: A | Discharge status: Home / Self Care | Payer: 59 | Source: Ambulatory Visit | Attending: Obstetrics and Gynecology | Admitting: Obstetrics and Gynecology

## 2018-11-07 ENCOUNTER — Encounter (HOSPITAL_COMMUNITY): Payer: Self-pay

## 2018-11-07 DIAGNOSIS — R102 Pelvic and perineal pain: Secondary | ICD-10-CM

## 2018-11-07 DIAGNOSIS — Z882 Allergy status to sulfonamides status: Secondary | ICD-10-CM | POA: Insufficient documentation

## 2018-11-07 DIAGNOSIS — O26893 Other specified pregnancy related conditions, third trimester: Secondary | ICD-10-CM | POA: Insufficient documentation

## 2018-11-07 DIAGNOSIS — N898 Other specified noninflammatory disorders of vagina: Secondary | ICD-10-CM | POA: Diagnosis not present

## 2018-11-07 DIAGNOSIS — Z880 Allergy status to penicillin: Secondary | ICD-10-CM

## 2018-11-07 DIAGNOSIS — G8929 Other chronic pain: Secondary | ICD-10-CM

## 2018-11-07 DIAGNOSIS — Z0371 Encounter for suspected problem with amniotic cavity and membrane ruled out: Secondary | ICD-10-CM | POA: Insufficient documentation

## 2018-11-07 DIAGNOSIS — Z3A39 39 weeks gestation of pregnancy: Secondary | ICD-10-CM | POA: Insufficient documentation

## 2018-11-07 DIAGNOSIS — O26899 Other specified pregnancy related conditions, unspecified trimester: Principal | ICD-10-CM

## 2018-11-07 LAB — AMNISURE RUPTURE OF MEMBRANE (ROM) NOT AT ARMC: Amnisure ROM: NEGATIVE

## 2018-11-07 LAB — POCT FERN TEST: POCT FERN TEST: NEGATIVE

## 2018-11-07 NOTE — Discharge Instructions (Signed)
·   Your Amnisure test was negative, and your baby looked great on the monitor.  If you notice more fluid leaking, please return to the MAU or clinic for evaluation.

## 2018-11-07 NOTE — MAU Note (Signed)
Nilda SimmerKristin P App is a 32 y.o. at 3555w1d here in MAU reporting: ?LOF. Clear.  Onset of complaint: during the night on Thursday. Intermittent occurances Pain score: denies FHT:+FM

## 2018-11-07 NOTE — MAU Provider Note (Signed)
History    CSN: [id number] Arrival date and time: 11/07/18 1336  Chief Complaint  Patient presents with  . Rupture of Membranes   HPI 32yo G1P0 at [redacted]w[redacted]d who presents with concerns for leaking fluid for past 3 days. States worse when gets up, notices a trickle of fluid. Never felt large gush of fluids. No itching, burning, irritation. No urinary symptoms, though gets up 6 times a night to go to bathroom. States fluid is clear and different from mucous-like discharge she had earlier in pregnancy.   Reports good fetal movement. Denies VB, denies contractions. States last SVE in clinic was dilated about 1 cm.   OB History    Gravida  1   Para      Term      Preterm      AB      Living        SAB      TAB      Ectopic      Multiple      Live Births              Past Medical History:  Diagnosis Date  . Chronic pelvic pain in female   . H/O chronic cystitis   . History of recurrent UTIs   . History of urethral stricture   . Nephrolithiasis    bilateral non-obstructive per urologist note 06-16-2017  -- dr Marlou Porch    Past Surgical History:  Procedure Laterality Date  . AUGMENTATION MAMMAPLASTY Bilateral 2009  . CHROMOPERTUBATION N/A 08/12/2017   Procedure: CHROMOPERTUBATION;  Surgeon: Mitchel Honour, DO;  Location: [facility];  Service: Gynecology;  Laterality: N/A;  . CYSTO/  URETHRAL DILATION  11/09/2004  . LAPAROSCOPY N/A 08/12/2017   Procedure: LAPAROSCOPY DIAGNOSTIC Fulgeration OF ENDOMETRIOSI;  Surgeon: Mitchel Honour, DO;  Location: [facility];  Service: Gynecology;  Laterality: N/A;  . WISDOM TOOTH EXTRACTION  03/2016    History reviewed. No pertinent family history.  Social History   Tobacco Use  . Smoking status: Never Smoker  . Smokeless tobacco: Never Used  Substance Use Topics  . Alcohol use: No    Alcohol/week: 0.0 standard drinks  . Drug use: No    Allergies:  Allergies  Allergen Reactions  .  Amoxicillin Hives  . Penicillins Hives  . Sulfa Antibiotics Hives    No medications prior to admission.    Review of Systems  Constitutional: Negative for activity change.  Genitourinary: Positive for vaginal discharge. Negative for dysuria, vaginal bleeding and vaginal pain.  Psychiatric/Behavioral: Positive for sleep disturbance.   Physical Exam   Blood pressure 113/63, pulse 92, temperature 98.3 F (36.8 C), temperature source Oral, resp. rate 18, weight 82.8 kg, SpO2 100 %.  Physical Exam  Nursing note and vitals reviewed. Constitutional: She is oriented to person, place, and time. She appears well-developed and well-nourished. No distress.  HENT:  Head: Normocephalic and atraumatic.  Eyes: Conjunctivae and EOM are normal. No scleral icterus.  Cardiovascular: Normal rate.  Respiratory: No respiratory distress.  GI:  Gravid, non-tender  Genitourinary:  Genitourinary Comments: Normal appearing vaginal mucous, moderate amount of clear/white, thin discharge without any increase with valsalva, no pooling  cervix appears slightly dilated  Neurological: She is alert and oriented to person, place, and time.  Psychiatric: She has a normal mood and affect. Her behavior is normal.    MAU Course  Procedures  MDM -- Reactive NST: 135/mod/+a/-d; uterine irritability  -- amnisure negative, stable for discharge  home   Assessment and Plan  32yo G1P0 at 8568w1d who presented with concern for leakage of fluids for last 3 days. Fern negative x 2, no pooling in vagina and no fluid noted with valsalva. Amnisure negative. Reassuring fetal status with reactive fetal tracing and + fetal movement. Discussed likely related to normal pregnancy discharge and/or leakage of urine. Counseled on return precautions, encouraged to follow-up with clinic in 2 days as scheduled.   Tamera StandsLaurel S Holley Wirt, DO  11/07/2018, 9:07 PM

## 2018-11-10 ENCOUNTER — Other Ambulatory Visit: Payer: Self-pay

## 2018-11-10 ENCOUNTER — Inpatient Hospital Stay (HOSPITAL_COMMUNITY)
Admission: AD | Admit: 2018-11-10 | Discharge: 2018-11-12 | DRG: 807 | Disposition: A | Discharge status: Home / Self Care | Payer: 59 | Attending: Obstetrics and Gynecology | Admitting: Obstetrics and Gynecology

## 2018-11-10 ENCOUNTER — Encounter (HOSPITAL_COMMUNITY): Payer: Self-pay | Admitting: *Deleted

## 2018-11-10 DIAGNOSIS — Z3A39 39 weeks gestation of pregnancy: Secondary | ICD-10-CM

## 2018-11-10 DIAGNOSIS — Z3483 Encounter for supervision of other normal pregnancy, third trimester: Secondary | ICD-10-CM | POA: Diagnosis present

## 2018-11-10 HISTORY — DX: Anemia, unspecified: D64.9

## 2018-11-10 LAB — CBC
HCT: 39.8 % (ref 36.0–46.0)
HEMOGLOBIN: 12.9 g/dL (ref 12.0–15.0)
MCH: 30.1 pg (ref 26.0–34.0)
MCHC: 32.4 g/dL (ref 30.0–36.0)
MCV: 92.8 fL (ref 80.0–100.0)
Platelets: 301 10*3/uL (ref 150–400)
RBC: 4.29 MIL/uL (ref 3.87–5.11)
RDW: 13.8 % (ref 11.5–15.5)
WBC: 21.6 10*3/uL — ABNORMAL HIGH (ref 4.0–10.5)
nRBC: 0 % (ref 0.0–0.2)

## 2018-11-10 LAB — TYPE AND SCREEN
ABO/RH(D): O POS
Antibody Screen: NEGATIVE

## 2018-11-10 LAB — ABO/RH: ABO/RH(D): O POS

## 2018-11-10 MED ORDER — ACETAMINOPHEN 325 MG PO TABS: 650.0000 mg | ORAL_TABLET | ORAL | Status: DC | PRN

## 2018-11-10 MED ORDER — LIDOCAINE HCL (PF) 1 % IJ SOLN
30.0000 mL | INTRAMUSCULAR | Status: DC | PRN
  Administered 2018-11-11: 30 mL via SUBCUTANEOUS
  Filled 2018-11-10: qty 30

## 2018-11-10 MED ORDER — ONDANSETRON HCL 4 MG/2ML IJ SOLN: 4.0000 mg | Freq: Four times a day (QID) | INTRAMUSCULAR | Status: DC | PRN

## 2018-11-10 MED ORDER — BUTORPHANOL TARTRATE 1 MG/ML IJ SOLN
INTRAMUSCULAR | Status: AC
  Administered 2018-11-10: 1 mg via INTRAVENOUS
  Filled 2018-11-10: qty 1

## 2018-11-10 MED ORDER — BUTORPHANOL TARTRATE 1 MG/ML IJ SOLN
1.0000 mg | INTRAMUSCULAR | Status: DC | PRN
  Administered 2018-11-10 – 2018-11-11 (×3): 1 mg via INTRAVENOUS
  Filled 2018-11-10 (×2): qty 1

## 2018-11-10 MED ORDER — LACTATED RINGERS IV SOLN
500.0000 mL | INTRAVENOUS | Status: DC | PRN
  Administered 2018-11-11: 500 mL via INTRAVENOUS

## 2018-11-10 MED ORDER — OXYTOCIN 40 UNITS IN LACTATED RINGERS INFUSION - SIMPLE MED
2.5000 [IU]/h | INTRAVENOUS | Status: DC
  Filled 2018-11-10: qty 1000

## 2018-11-10 MED ORDER — OXYCODONE-ACETAMINOPHEN 5-325 MG PO TABS: 2.0000 | ORAL_TABLET | ORAL | Status: DC | PRN

## 2018-11-10 MED ORDER — SOD CITRATE-CITRIC ACID 500-334 MG/5ML PO SOLN
30.0000 mL | ORAL | Status: DC | PRN
  Administered 2018-11-11: 30 mL via ORAL
  Filled 2018-11-10: qty 15

## 2018-11-10 MED ORDER — OXYCODONE-ACETAMINOPHEN 5-325 MG PO TABS: 1.0000 | ORAL_TABLET | ORAL | Status: DC | PRN

## 2018-11-10 MED ORDER — OXYTOCIN BOLUS FROM INFUSION
500.0000 mL | Freq: Once | INTRAVENOUS | Status: AC
  Administered 2018-11-11: 500 mL via INTRAVENOUS

## 2018-11-10 MED ORDER — FLEET ENEMA 7-19 GM/118ML RE ENEM: 1.0000 | ENEMA | RECTAL | Status: DC | PRN

## 2018-11-10 MED ORDER — LACTATED RINGERS IV SOLN
INTRAVENOUS | Status: DC
  Administered 2018-11-10 – 2018-11-11 (×3): via INTRAVENOUS

## 2018-11-10 NOTE — MAU Note (Signed)
Pt c/o u/c's throughout the day but became strong about 1820 with bloody show. Feeling fetal movement today . Denies any vag leaking.

## 2018-11-10 NOTE — H&P (Signed)
Stacey Houston is a 32 y.o. female presenting for SOL.  Pregnancy uncomplicated.  OB History    Gravida  1   Para      Term      Preterm      AB      Living        SAB      TAB      Ectopic      Multiple      Live Births             Past Medical History:  Diagnosis Date  . Anemia   . Chronic pelvic pain in female   . H/O chronic cystitis   . History of recurrent UTIs   . History of urethral stricture   . Nephrolithiasis    bilateral non-obstructive per urologist note 06-16-2017  -- dr Marlou Porchherrick   Past Surgical History:  Procedure Laterality Date  . AUGMENTATION MAMMAPLASTY Bilateral 2009  . CHROMOPERTUBATION N/A 08/12/2017   Procedure: CHROMOPERTUBATION;  Surgeon: Mitchel HonourMorris, Megan, DO;  [facility];  Service: Gynecology;  Laterality: N/A;  . CYSTO/  URETHRAL DILATION  11/09/2004  . LAPAROSCOPY N/A 08/12/2017   Procedure: LAPAROSCOPY DIAGNOSTIC Fulgeration OF ENDOMETRIOSI;  Surgeon: Mitchel HonourMorris, Megan, DO;  Location: [facility];  Service: Gynecology;  Laterality: N/A;  . WISDOM TOOTH EXTRACTION  03/2016   Family History: family history includes Cancer in her paternal grandfather and paternal grandmother; Diabetes in her maternal grandfather, maternal grandmother, paternal grandfather, and paternal grandmother. Social History:  reports that she has never smoked. She has never used smokeless tobacco. She reports that she does not drink alcohol or use drugs.     Maternal Diabetes: No Genetic Screening: Normal Maternal Ultrasounds/Referrals: Normal Fetal Ultrasounds or other Referrals:  None Maternal Substance Abuse:  No Significant Maternal Medications:  None Significant Maternal Lab Results:  None Other Comments:  None  ROS History Dilation: 8 Effacement (%): 100 Station: 0 Exam by:: Renaldo FiddlerAdkins, MD Blood pressure 129/73, pulse 98, temperature 97.8 F (36.6 C), temperature source Oral, resp. rate 20, height 5\' 4"  (1.626  m), weight 82.6 kg, SpO2 98 %. Exam Physical Exam  + FHT  Toco Q3 Gen - NAD Abd - gravid, NT  EFW 7.5# Ext - NT Cvx 8/100/0 AROM - blood tinged fluid Prenatal labs: ABO, Rh: --/--/O POS, O POS Performed at [facility], [address] [place], [place] [address]  [phone](11/26 2020) Antibody: NEG (11/26 2020) Rubella: Nonimmune (04/23 0000) RPR: Nonreactive (04/23 0000)  HBsAg: Negative (04/23 0000)  HIV: Non-reactive (04/23 0000)  GBS: Negative (10/29 0000)   Assessment/Plan: Labor Pain currently controlled with IV meds and nitrous Epidural prn Exp mngt   Stacey Houston 11/10/2018, 10:44 PM

## 2018-11-11 ENCOUNTER — Inpatient Hospital Stay (HOSPITAL_COMMUNITY): Payer: 59 | Admitting: Anesthesiology

## 2018-11-11 ENCOUNTER — Encounter (HOSPITAL_COMMUNITY): Payer: Self-pay

## 2018-11-11 ENCOUNTER — Encounter (HOSPITAL_COMMUNITY)
Admission: AD | Disposition: A | Discharge status: Home / Self Care | Payer: Self-pay | Source: Home / Self Care | Attending: Obstetrics and Gynecology

## 2018-11-11 HISTORY — PX: DILATION AND EVACUATION: SHX1459

## 2018-11-11 LAB — CBC
HCT: 40.6 % (ref 36.0–46.0)
Hemoglobin: 13.7 g/dL (ref 12.0–15.0)
MCH: 30.9 pg (ref 26.0–34.0)
MCHC: 33.7 g/dL (ref 30.0–36.0)
MCV: 91.6 fL (ref 80.0–100.0)
NRBC: 0 % (ref 0.0–0.2)
PLATELETS: 294 10*3/uL (ref 150–400)
RBC: 4.43 MIL/uL (ref 3.87–5.11)
RDW: 13.8 % (ref 11.5–15.5)
WBC: 31 10*3/uL — ABNORMAL HIGH (ref 4.0–10.5)

## 2018-11-11 LAB — RPR: RPR: NONREACTIVE

## 2018-11-11 SURGERY — DILATION AND EVACUATION, UTERUS
Anesthesia: Spinal | Wound class: Clean Contaminated

## 2018-11-11 MED ORDER — DIBUCAINE 1 % RE OINT: 1.0000 "application " | TOPICAL_OINTMENT | RECTAL | Status: DC | PRN

## 2018-11-11 MED ORDER — BENZOCAINE-MENTHOL 20-0.5 % EX AERO
1.0000 "application " | INHALATION_SPRAY | CUTANEOUS | Status: DC | PRN
  Administered 2018-11-11: 1 via TOPICAL
  Filled 2018-11-11: qty 56

## 2018-11-11 MED ORDER — METHYLERGONOVINE MALEATE 0.2 MG PO TABS
0.2000 mg | ORAL_TABLET | Freq: Three times a day (TID) | ORAL | Status: DC
  Administered 2018-11-11 – 2018-11-12 (×4): 0.2 mg via ORAL
  Filled 2018-11-11 (×4): qty 1

## 2018-11-11 MED ORDER — MEASLES, MUMPS & RUBELLA VAC IJ SOLR
0.5000 mL | Freq: Once | INTRAMUSCULAR | Status: AC
  Administered 2018-11-12: 0.5 mL via SUBCUTANEOUS
  Filled 2018-11-11 (×2): qty 0.5

## 2018-11-11 MED ORDER — TETANUS-DIPHTH-ACELL PERTUSSIS 5-2.5-18.5 LF-MCG/0.5 IM SUSP: 0.5000 mL | Freq: Once | INTRAMUSCULAR | Status: DC

## 2018-11-11 MED ORDER — LACTATED RINGERS IV SOLN: INTRAVENOUS | Status: DC

## 2018-11-11 MED ORDER — WITCH HAZEL-GLYCERIN EX PADS: 1.0000 "application " | MEDICATED_PAD | CUTANEOUS | Status: DC | PRN

## 2018-11-11 MED ORDER — BUPIVACAINE IN DEXTROSE 0.75-8.25 % IT SOLN
INTRATHECAL | Status: DC | PRN
  Administered 2018-11-11: 1.1 mL via INTRATHECAL

## 2018-11-11 MED ORDER — ONDANSETRON HCL 4 MG PO TABS: 4.0000 mg | ORAL_TABLET | ORAL | Status: DC | PRN

## 2018-11-11 MED ORDER — SIMETHICONE 80 MG PO CHEW: 80.0000 mg | CHEWABLE_TABLET | ORAL | Status: DC | PRN

## 2018-11-11 MED ORDER — ONDANSETRON HCL 4 MG/2ML IJ SOLN
INTRAMUSCULAR | Status: AC
  Filled 2018-11-11: qty 2

## 2018-11-11 MED ORDER — ACETAMINOPHEN 325 MG PO TABS
650.0000 mg | ORAL_TABLET | ORAL | Status: DC | PRN
  Administered 2018-11-11: 650 mg via ORAL
  Filled 2018-11-11: qty 2

## 2018-11-11 MED ORDER — OXYCODONE-ACETAMINOPHEN 5-325 MG PO TABS: 1.0000 | ORAL_TABLET | ORAL | Status: DC | PRN

## 2018-11-11 MED ORDER — MEPERIDINE HCL 25 MG/ML IJ SOLN: 6.2500 mg | INTRAMUSCULAR | Status: DC | PRN

## 2018-11-11 MED ORDER — OXYTOCIN 40 UNITS IN LACTATED RINGERS INFUSION - SIMPLE MED
2.5000 [IU]/h | INTRAVENOUS | Status: AC
  Administered 2018-11-11: 2.5 [IU]/h via INTRAVENOUS
  Filled 2018-11-11: qty 1000

## 2018-11-11 MED ORDER — DIPHENHYDRAMINE HCL 25 MG PO CAPS: 25.0000 mg | ORAL_CAPSULE | Freq: Four times a day (QID) | ORAL | Status: DC | PRN

## 2018-11-11 MED ORDER — PRENATAL MULTIVITAMIN CH
1.0000 | ORAL_TABLET | Freq: Every day | ORAL | Status: DC
  Administered 2018-11-11 – 2018-11-12 (×2): 1 via ORAL
  Filled 2018-11-11 (×2): qty 1

## 2018-11-11 MED ORDER — MEDROXYPROGESTERONE ACETATE 150 MG/ML IM SUSP: 150.0000 mg | INTRAMUSCULAR | Status: DC | PRN

## 2018-11-11 MED ORDER — MIDAZOLAM HCL 2 MG/2ML IJ SOLN
INTRAMUSCULAR | Status: DC | PRN
  Administered 2018-11-11: 2 mg via INTRAVENOUS

## 2018-11-11 MED ORDER — SENNOSIDES-DOCUSATE SODIUM 8.6-50 MG PO TABS
2.0000 | ORAL_TABLET | ORAL | Status: DC
  Administered 2018-11-11: 2 via ORAL
  Filled 2018-11-11: qty 2

## 2018-11-11 MED ORDER — MEPERIDINE HCL 25 MG/ML IJ SOLN
INTRAMUSCULAR | Status: DC | PRN
  Administered 2018-11-11: 25 mg via INTRAVENOUS

## 2018-11-11 MED ORDER — IBUPROFEN 600 MG PO TABS
600.0000 mg | ORAL_TABLET | Freq: Four times a day (QID) | ORAL | Status: DC
  Administered 2018-11-11 – 2018-11-12 (×6): 600 mg via ORAL
  Filled 2018-11-11 (×6): qty 1

## 2018-11-11 MED ORDER — METOCLOPRAMIDE HCL 5 MG/ML IJ SOLN: 10.0000 mg | Freq: Once | INTRAMUSCULAR | Status: DC | PRN

## 2018-11-11 MED ORDER — MEPERIDINE HCL 25 MG/ML IJ SOLN
INTRAMUSCULAR | Status: AC
  Filled 2018-11-11: qty 1

## 2018-11-11 MED ORDER — FENTANYL CITRATE (PF) 100 MCG/2ML IJ SOLN: 25.0000 ug | INTRAMUSCULAR | Status: DC | PRN

## 2018-11-11 MED ORDER — CLINDAMYCIN PHOSPHATE 900 MG/50ML IV SOLN
900.0000 mg | Freq: Three times a day (TID) | INTRAVENOUS | Status: DC
  Administered 2018-11-11: 900 mg via INTRAVENOUS
  Filled 2018-11-11 (×3): qty 50

## 2018-11-11 MED ORDER — COCONUT OIL OIL: 1.0000 "application " | TOPICAL_OIL | Status: DC | PRN

## 2018-11-11 MED ORDER — MIDAZOLAM HCL 2 MG/2ML IJ SOLN
INTRAMUSCULAR | Status: AC
  Filled 2018-11-11: qty 2

## 2018-11-11 MED ORDER — CLINDAMYCIN PHOSPHATE 900 MG/50ML IV SOLN
900.0000 mg | Freq: Once | INTRAVENOUS | Status: AC
  Administered 2018-11-11: 900 mg via INTRAVENOUS
  Filled 2018-11-11: qty 50

## 2018-11-11 MED ORDER — METHYLERGONOVINE MALEATE 0.2 MG/ML IJ SOLN
INTRAMUSCULAR | Status: DC | PRN
  Administered 2018-11-11: 0.2 mg via INTRAMUSCULAR

## 2018-11-11 MED ORDER — PROPOFOL 10 MG/ML IV BOLUS
INTRAVENOUS | Status: AC
  Filled 2018-11-11: qty 20

## 2018-11-11 MED ORDER — ONDANSETRON HCL 4 MG/2ML IJ SOLN: 4.0000 mg | INTRAMUSCULAR | Status: DC | PRN

## 2018-11-11 MED ORDER — OXYCODONE-ACETAMINOPHEN 5-325 MG PO TABS: 2.0000 | ORAL_TABLET | ORAL | Status: DC | PRN

## 2018-11-11 MED ORDER — LIDOCAINE HCL (CARDIAC) PF 100 MG/5ML IV SOSY
PREFILLED_SYRINGE | INTRAVENOUS | Status: AC
  Filled 2018-11-11: qty 5

## 2018-11-11 MED ORDER — ONDANSETRON HCL 4 MG/2ML IJ SOLN
INTRAMUSCULAR | Status: DC | PRN
  Administered 2018-11-11: 4 mg via INTRAVENOUS

## 2018-11-11 SURGICAL SUPPLY — 19 items
CATH ROBINSON RED A/P 16FR (CATHETERS) ×3 IMPLANT
CLOTH BEACON ORANGE TIMEOUT ST (SAFETY) ×3 IMPLANT
DECANTER SPIKE VIAL GLASS SM (MISCELLANEOUS) ×3 IMPLANT
GLOVE BIO SURGEON STRL SZ 6.5 (GLOVE) ×2 IMPLANT
GLOVE BIO SURGEONS STRL SZ 6.5 (GLOVE) ×1
GLOVE BIOGEL PI IND STRL 7.0 (GLOVE) ×2 IMPLANT
GLOVE BIOGEL PI INDICATOR 7.0 (GLOVE) ×4
GOWN STRL REUS W/TWL LRG LVL3 (GOWN DISPOSABLE) ×6 IMPLANT
KIT BERKELEY 1ST TRIMESTER 3/8 (MISCELLANEOUS) ×3 IMPLANT
NS IRRIG 1000ML POUR BTL (IV SOLUTION) ×3 IMPLANT
PACK VAGINAL MINOR WOMEN LF (CUSTOM PROCEDURE TRAY) ×3 IMPLANT
PAD OB MATERNITY 4.3X12.25 (PERSONAL CARE ITEMS) ×3 IMPLANT
PAD PREP 24X48 CUFFED NSTRL (MISCELLANEOUS) ×3 IMPLANT
SET BERKELEY SUCTION TUBING (SUCTIONS) ×3 IMPLANT
TOWEL OR 17X24 6PK STRL BLUE (TOWEL DISPOSABLE) ×6 IMPLANT
VACURETTE 10 RIGID CVD (CANNULA) IMPLANT
VACURETTE 7MM CVD STRL WRAP (CANNULA) IMPLANT
VACURETTE 8 RIGID CVD (CANNULA) IMPLANT
VACURETTE 9 RIGID CVD (CANNULA) IMPLANT

## 2018-11-11 NOTE — Anesthesia Procedure Notes (Signed)
Spinal  Patient location during procedure: OR Staffing Anesthesiologist: Tyjon Bowen, MD Performed: anesthesiologist  Preanesthetic Checklist Completed: patient identified, site marked, surgical consent, pre-op evaluation, timeout performed, IV checked, risks and benefits discussed and monitors and equipment checked Spinal Block Patient position: sitting Prep: DuraPrep Patient monitoring: heart rate, continuous pulse ox and blood pressure Approach: midline Location: L3-4 Injection technique: single-shot Needle Needle type: Sprotte  Needle gauge: 24 G Needle length: 9 cm Additional Notes Expiration date of kit checked and confirmed. Patient tolerated procedure well, without complications.       

## 2018-11-11 NOTE — Transfer of Care (Signed)
Immediate Anesthesia Transfer of Care Note  Patient: Stacey Houston  Procedure(s) Performed: DILATATION AND EVACUATION (N/A )  Patient Location: PACU  Anesthesia Type:Spinal  Level of Consciousness: awake  Airway & Oxygen Therapy: Patient Spontanous Breathing  Post-op Assessment: Report given to RN and Post -op Vital signs reviewed and stable  Post vital signs: Reviewed and stable  Last Vitals:  Vitals Value Taken Time  BP 111/61 11/11/2018  2:20 AM  Temp    Pulse 86 11/11/2018  2:23 AM  Resp 18 11/11/2018  2:23 AM  SpO2 99 % 11/11/2018  2:23 AM  Vitals shown include unvalidated device data.  Last Pain:  Vitals:   11/11/18 0220  TempSrc:   PainSc: (P) 0-No pain         Complications: No apparent anesthesia complications

## 2018-11-11 NOTE — Progress Notes (Signed)
SVD of viable female infant with apgars 9,9 Placenta would not deliver spontaneously Manual extraction attempted but pt without epidural and unable to tolerate Rec to OR for anesthesia and extraction of placenta with possible D&C Clindamycin ordered.

## 2018-11-11 NOTE — Progress Notes (Signed)
Post Partum Day 0-1 Subjective: no complaints  Objective: Blood pressure 117/68, pulse 79, temperature 98.1 F (36.7 C), temperature source Oral, resp. rate 16, height 5\' 4"  (1.626 m), weight 82.6 kg, SpO2 98 %, unknown if currently breastfeeding.  Physical Exam:  General: alert Lochia: appropriate Uterine Fundus: firm Incision: healing well DVT Evaluation: No evidence of DVT seen on physical exam.  Recent Labs    11/10/18 2020 11/11/18 0620  HGB 12.9 13.7  HCT 39.8 40.6    Assessment/Plan: Plan for discharge tomorrow   LOS: 1 day   Meriel PicaRichard M Nilo Fallin 11/11/2018, 8:00 AM

## 2018-11-11 NOTE — Anesthesia Preprocedure Evaluation (Signed)
Anesthesia Evaluation  Patient identified by MRN, date of birth, ID band Patient awake    Reviewed: Allergy & Precautions, NPO status , Patient's Chart, lab work & pertinent test results  Airway Mallampati: II  TM Distance: >3 FB Neck ROM: Full    Dental no notable dental hx.    Pulmonary neg pulmonary ROS,    Pulmonary exam normal breath sounds clear to auscultation       Cardiovascular negative cardio ROS Normal cardiovascular exam Rhythm:Regular Rate:Normal     Neuro/Psych negative neurological ROS  negative psych ROS   GI/Hepatic negative GI ROS, Neg liver ROS,   Endo/Other  negative endocrine ROS  Renal/GU negative Renal ROS  negative genitourinary   Musculoskeletal negative musculoskeletal ROS (+)   Abdominal   Peds negative pediatric ROS (+)  Hematology negative hematology ROS (+)   Anesthesia Other Findings   Reproductive/Obstetrics (+) Pregnancy                             Anesthesia Physical Anesthesia Plan  ASA: II and emergent  Anesthesia Plan: Spinal   Post-op Pain Management:    Induction:   PONV Risk Score and Plan: 2 and Treatment may vary due to age or medical condition  Airway Management Planned: Natural Airway  Additional Equipment:   Intra-op Plan:   Post-operative Plan:   Informed Consent: I have reviewed the patients History and Physical, chart, labs and discussed the procedure including the risks, benefits and alternatives for the proposed anesthesia with the patient or authorized representative who has indicated his/her understanding and acceptance.   Dental advisory given  Plan Discussed with: CRNA  Anesthesia Plan Comments:         Anesthesia Quick Evaluation

## 2018-11-11 NOTE — Op Note (Signed)
11/10/2018 - 11/11/2018  2:12 AM  PATIENT:  Stacey Houston  32 y.o. female  PRE-OPERATIVE DIAGNOSIS:  Retained Products of Conception; dilatation and evacuation  POST-OPERATIVE DIAGNOSIS:  Retained Products of Conception;   PROCEDURE:  Manual extraction of placenta, repair of 2nd degree perineal laceration  SURGEON:  Surgeon(s) and Role:    Zelphia Cairo* Brok Stocking, MD - Primary  ANESTHESIA:   spinal  EBL: 350cc  DRAINS: none   LOCAL MEDICATIONS USED:  NONE  SPECIMEN:  Source of Specimen:  placenta  DISPOSITION OF SPECIMEN:  PATHOLOGY  COUNTS:  YES  PLAN OF CARE: Admit to inpatient   PATIENT DISPOSITION:  PACU - hemodynamically stable.   Procedure:  The patient was taken to OR after informed consent was obtained.  She was given spinal anesthesia and prepped and draped and an in and out catheter was performed.  Manual extraction was done to remove remaining placenta & membranes from uterine fundus. Another sweep was done with my hand and no further tissue or membranes could be palpated.  Fundus was firm.  2nd degree and right vaginal sidewall laceration were then repaired with 3-0 vicryl rapide.

## 2018-11-12 LAB — CBC WITH DIFFERENTIAL/PLATELET
BASOS PCT: 0 %
Basophils Absolute: 0 10*3/uL (ref 0.0–0.1)
EOS ABS: 0.4 10*3/uL (ref 0.0–0.5)
EOS PCT: 3 %
HCT: 38.6 % (ref 36.0–46.0)
Hemoglobin: 12.7 g/dL (ref 12.0–15.0)
Lymphocytes Relative: 18 %
Lymphs Abs: 2.8 10*3/uL (ref 0.7–4.0)
MCH: 30.6 pg (ref 26.0–34.0)
MCHC: 32.9 g/dL (ref 30.0–36.0)
MCV: 93 fL (ref 80.0–100.0)
MONO ABS: 0.7 10*3/uL (ref 0.1–1.0)
MONOS PCT: 5 %
NEUTROS PCT: 74 %
Neutro Abs: 12.2 10*3/uL — ABNORMAL HIGH (ref 1.7–7.7)
Platelets: 263 10*3/uL (ref 150–400)
RBC: 4.15 MIL/uL (ref 3.87–5.11)
RDW: 14.1 % (ref 11.5–15.5)
WBC: 16.3 10*3/uL — ABNORMAL HIGH (ref 4.0–10.5)
nRBC: 0 % (ref 0.0–0.2)

## 2018-11-12 NOTE — Lactation Note (Addendum)
This note was copied from a baby's chart. Lactation Consultation Note  Patient Name: Stacey Miachel RouxKristin Rieth XBJYN'WToday's Date: 11/12/2018 Reason for consult: Initial assessment;1st time breastfeeding;Term P1, 23 hour female infant Per mom, she did not attend any BF classes in her pregnancy. Per parents,  infant had 1 void and 6 stools. Mom has medela DEBP at home. Mom was doing STS as LC entered the room.  Mom latched infant on left breast using the football hold, infant mouth wide and swallows observed.  Infant BF for 15 minutes. Mom hand express and infant was given 5 ml of colostrum by spoon LC discussed I & O. Reviewed Baby & Me book's Breastfeeding Basics.  Mom will breastfeed according to hunger cues, 8 to 12 times within 24 hours including nights. Mom made aware of O/P services, breastfeeding support groups, community resources, and our phone # for post-discharge questions.  Maternal Data Formula Feeding for Exclusion: No Has patient been taught Hand Expression?: Yes(Mom hand express and gave infant back 5 ml of colostrum.) Does the patient have breastfeeding experience prior to this delivery?: No  Feeding Feeding Type: Breast Fed  LATCH Score Latch: Grasps breast easily, tongue down, lips flanged, rhythmical sucking.  Audible Swallowing: Spontaneous and intermittent  Type of Nipple: Everted at rest and after stimulation  Comfort (Breast/Nipple): Soft / non-tender  Hold (Positioning): Assistance needed to correctly position infant at breast and maintain latch.  LATCH Score: 9  Interventions Interventions: Breast feeding basics reviewed;Assisted with latch;Skin to skin;Adjust position;Breast compression;Support pillows;Hand express  Lactation Tools Discussed/Used WIC Program: No   Consult Status Consult Status: Follow-up Date: 11/13/18 Follow-up type: In-patient    Danelle EarthlyRobin Roselin Wiemann 11/12/2018, 12:12 AM

## 2018-11-12 NOTE — Discharge Summary (Signed)
Obstetric Discharge Summary Reason for Admission: onset of labor Prenatal Procedures: none Intrapartum Procedures: spontaneous vaginal delivery, uterine exploration and curettage Postpartum Procedures: curettage and antibiotics Complications-Operative and Postpartum: 2 degree perineal laceration, retained placenta Hemoglobin  Date Value Ref Range Status  11/12/2018 12.7 12.0 - 15.0 g/dL Final   HCT  Date Value Ref Range Status  11/12/2018 38.6 36.0 - 46.0 % Final    Physical Exam:  General: alert, cooperative, appears stated age and no distress Lochia: appropriate Uterine Fundus: firm Incision: healing well DVT Evaluation: No evidence of DVT seen on physical exam.  Discharge Diagnoses: Term Pregnancy-delivered and retained placenta  Discharge Information: Date: 11/12/2018 Activity: pelvic rest Diet: routine Medications: None Condition: stable Instructions: refer to practice specific booklet Discharge to: home   Newborn Data: Live born female  Birth Weight: 6 lb 12.3 oz (3070 g) APGAR: 9, 9  Newborn Delivery   Birth date/time:  11/11/2018 00:53:00 Delivery type:  Vaginal, Spontaneous     Home with mother.  Stacey Houston 11/12/2018, 8:59 AM

## 2018-11-13 NOTE — Anesthesia Postprocedure Evaluation (Signed)
Anesthesia Post Note  Patient: Stacey Houston  Procedure(s) Performed: DILATATION AND EVACUATION (N/A )     Patient location during evaluation: PACU Anesthesia Type: Spinal Level of consciousness: awake and alert Pain management: pain level controlled Vital Signs Assessment: post-procedure vital signs reviewed and stable Respiratory status: spontaneous breathing and respiratory function stable Cardiovascular status: blood pressure returned to baseline and stable Postop Assessment: no headache, no backache, spinal receding and no apparent nausea or vomiting Anesthetic complications: no    Last Vitals:  Vitals:   11/11/18 2240 11/12/18 0534  BP: (!) 107/54 (!) 105/55  Pulse: 86 73  Resp: 18 18  Temp: 36.8 C 37.1 C  SpO2:      Last Pain:  Vitals:   11/12/18 1000  TempSrc:   PainSc: 0-No pain   Pain Goal:                 [name], [name]

## 2020-12-16 NOTE — L&D Delivery Note (Signed)
Delivery Note At 9:29 AM a viable female was delivered via Vaginal, Spontaneous (Presentation: Left Occiput Anterior).  APGAR: pend, ; weight  .   Placenta status: Spontaneous, Intact.  Cord: 3 vessels with the following complications: None.   Placenta delivered spontaneously without issue, uterine cavity clear of clot and membrane.   Anesthesia: Epidural Episiotomy: None Lacerations: 2st degree;Perineal Suture Repair: 2.0 vicryl Est. Blood Loss (mL):  150 cc  Mom to postpartum.  Baby to Couplet care / Skin to Skin.  Stacey Houston 12/11/2021, 9:50 AM

## 2021-05-07 LAB — OB RESULTS CONSOLE RPR: RPR: NONREACTIVE

## 2021-05-07 LAB — OB RESULTS CONSOLE RUBELLA ANTIBODY, IGM: Rubella: IMMUNE

## 2021-05-07 LAB — OB RESULTS CONSOLE ABO/RH: RH Type: POSITIVE

## 2021-05-07 LAB — OB RESULTS CONSOLE HEPATITIS B SURFACE ANTIGEN: Hepatitis B Surface Ag: NEGATIVE

## 2021-05-07 LAB — OB RESULTS CONSOLE HIV ANTIBODY (ROUTINE TESTING): HIV: NONREACTIVE

## 2021-05-07 LAB — HEPATITIS C ANTIBODY: HCV Ab: NEGATIVE

## 2021-05-07 LAB — OB RESULTS CONSOLE ANTIBODY SCREEN: Antibody Screen: NEGATIVE

## 2021-05-16 ENCOUNTER — Inpatient Hospital Stay (HOSPITAL_COMMUNITY)
Admission: AD | Admit: 2021-05-16 | Discharge: 2021-05-16 | Disposition: A | Discharge status: Home / Self Care | Payer: No Typology Code available for payment source | Attending: Obstetrics & Gynecology | Admitting: Obstetrics & Gynecology

## 2021-05-16 ENCOUNTER — Encounter (HOSPITAL_COMMUNITY): Payer: Self-pay | Admitting: Obstetrics & Gynecology

## 2021-05-16 ENCOUNTER — Other Ambulatory Visit: Payer: Self-pay

## 2021-05-16 DIAGNOSIS — O212 Late vomiting of pregnancy: Secondary | ICD-10-CM | POA: Diagnosis not present

## 2021-05-16 DIAGNOSIS — E876 Hypokalemia: Secondary | ICD-10-CM | POA: Insufficient documentation

## 2021-05-16 DIAGNOSIS — O98513 Other viral diseases complicating pregnancy, third trimester: Secondary | ICD-10-CM | POA: Insufficient documentation

## 2021-05-16 DIAGNOSIS — Z880 Allergy status to penicillin: Secondary | ICD-10-CM | POA: Insufficient documentation

## 2021-05-16 DIAGNOSIS — O99283 Endocrine, nutritional and metabolic diseases complicating pregnancy, third trimester: Secondary | ICD-10-CM | POA: Diagnosis not present

## 2021-05-16 DIAGNOSIS — O98511 Other viral diseases complicating pregnancy, first trimester: Secondary | ICD-10-CM | POA: Diagnosis not present

## 2021-05-16 DIAGNOSIS — O99281 Endocrine, nutritional and metabolic diseases complicating pregnancy, first trimester: Secondary | ICD-10-CM | POA: Diagnosis not present

## 2021-05-16 DIAGNOSIS — R509 Fever, unspecified: Secondary | ICD-10-CM | POA: Insufficient documentation

## 2021-05-16 DIAGNOSIS — Z882 Allergy status to sulfonamides status: Secondary | ICD-10-CM | POA: Insufficient documentation

## 2021-05-16 DIAGNOSIS — O219 Vomiting of pregnancy, unspecified: Secondary | ICD-10-CM | POA: Diagnosis not present

## 2021-05-16 DIAGNOSIS — Z3A31 31 weeks gestation of pregnancy: Secondary | ICD-10-CM | POA: Insufficient documentation

## 2021-05-16 DIAGNOSIS — Z3A09 9 weeks gestation of pregnancy: Secondary | ICD-10-CM

## 2021-05-16 DIAGNOSIS — U071 COVID-19: Secondary | ICD-10-CM | POA: Insufficient documentation

## 2021-05-16 LAB — COMPREHENSIVE METABOLIC PANEL
ALT: 19 U/L (ref 0–44)
AST: 17 U/L (ref 15–41)
Albumin: 3.6 g/dL (ref 3.5–5.0)
Alkaline Phosphatase: 58 U/L (ref 38–126)
Anion gap: 10 (ref 5–15)
BUN: 7 mg/dL (ref 6–20)
CO2: 26 mmol/L (ref 22–32)
Calcium: 9.1 mg/dL (ref 8.9–10.3)
Chloride: 99 mmol/L (ref 98–111)
Creatinine, Ser: 0.56 mg/dL (ref 0.44–1.00)
GFR, Estimated: >60 mL/min (ref >60)
Glucose, Bld: 88 mg/dL (ref 70–99)
Potassium: 3.4 mmol/L — ABNORMAL LOW (ref 3.5–5.1)
Sodium: 135 mmol/L (ref 135–145)
Total Bilirubin: 0.7 mg/dL (ref 0.3–1.2)
Total Protein: 6.8 g/dL (ref 6.5–8.1)

## 2021-05-16 LAB — CBC WITH DIFFERENTIAL/PLATELET
Abs Immature Granulocytes: 0.08 10*3/uL — ABNORMAL HIGH (ref 0.00–0.07)
Basophils Absolute: 0 10*3/uL (ref 0.0–0.1)
Basophils Relative: 0 %
Eosinophils Absolute: 0 10*3/uL (ref 0.0–0.5)
Eosinophils Relative: 0 %
HCT: 38.4 % (ref 36.0–46.0)
Hemoglobin: 12.8 g/dL (ref 12.0–15.0)
Immature Granulocytes: 1 %
Lymphocytes Relative: 6 %
Lymphs Abs: 0.6 10*3/uL — ABNORMAL LOW (ref 0.7–4.0)
MCH: 30.3 pg (ref 26.0–34.0)
MCHC: 33.3 g/dL (ref 30.0–36.0)
MCV: 91 fL (ref 80.0–100.0)
Monocytes Absolute: 0.7 10*3/uL (ref 0.1–1.0)
Monocytes Relative: 7 %
Neutro Abs: 7.9 10*3/uL — ABNORMAL HIGH (ref 1.7–7.7)
Neutrophils Relative %: 86 %
Platelets: 320 10*3/uL (ref 150–400)
RBC: 4.22 MIL/uL (ref 3.87–5.11)
RDW: 12.5 % (ref 11.5–15.5)
WBC: 9.2 10*3/uL (ref 4.0–10.5)
nRBC: 0 % (ref 0.0–0.2)

## 2021-05-16 LAB — POCT PREGNANCY, URINE: Preg Test, Ur: POSITIVE — AB

## 2021-05-16 LAB — URINALYSIS, ROUTINE W REFLEX MICROSCOPIC
Bilirubin Urine: NEGATIVE
Glucose, UA: NEGATIVE mg/dL
Hgb urine dipstick: NEGATIVE
Ketones, ur: 80 mg/dL — AB
Leukocytes,Ua: NEGATIVE
Nitrite: NEGATIVE
Protein, ur: NEGATIVE mg/dL
Specific Gravity, Urine: 1.021 (ref 1.005–1.030)
pH: 5 (ref 5.0–8.0)

## 2021-05-16 LAB — RESP PANEL BY RT-PCR (FLU A&B, COVID) ARPGX2
Influenza A by PCR: NEGATIVE
Influenza B by PCR: NEGATIVE
SARS Coronavirus 2 by RT PCR: POSITIVE — AB

## 2021-05-16 MED ORDER — M.V.I. ADULT IV INJ
Freq: Once | INTRAVENOUS | Status: AC
  Filled 2021-05-16: qty 10

## 2021-05-16 MED ORDER — SODIUM CHLORIDE 0.9 % IV SOLN
12.5000 mg | Freq: Once | INTRAVENOUS | Status: AC
  Administered 2021-05-16: 12.5 mg via INTRAVENOUS
  Filled 2021-05-16: qty 0.5

## 2021-05-16 MED ORDER — LACTATED RINGERS IV BOLUS
500.0000 mL | Freq: Once | INTRAVENOUS | Status: AC
  Administered 2021-05-16: 500 mL via INTRAVENOUS

## 2021-05-16 MED ORDER — FAMOTIDINE 10 MG PO TABS: 10.0000 mg | ORAL_TABLET | Freq: Two times a day (BID) | ORAL | 0 refills | Status: DC

## 2021-05-16 MED ORDER — ACETAMINOPHEN 325 MG PO TABS
650.0000 mg | ORAL_TABLET | Freq: Once | ORAL | Status: AC
  Administered 2021-05-16: 650 mg via ORAL
  Filled 2021-05-16: qty 2

## 2021-05-16 MED ORDER — PROMETHAZINE HCL 12.5 MG PO TABS: 12.5000 mg | ORAL_TABLET | Freq: Four times a day (QID) | ORAL | 0 refills | Status: DC | PRN

## 2021-05-16 NOTE — MAU Note (Signed)
Yesterday was feeling run down and nauseous. ( has been more nauseated with this preg)  Went to work today, threw up several times. Came home, started feeling really achy (better now). Took a nap, when woke up- she felt hot, checked her temp 100.3.  Kept a bagel down this morning, only thing she has kept down besides 2 Tylenol at 1345.  Has not been able to keep fluids down. Called office, was told to come here.  Woke up last night during the night, center of chest was hurting, felt like she just needed to burp, and couldn't....hasn't hurt today.

## 2021-05-16 NOTE — MAU Provider Note (Signed)
History     CSN: [id number]  Arrival date and time: 05/16/21 1454   None     Chief Complaint  Patient presents with  . Fever  . Emesis   HPI Stacey Houston is a 35 y.o. G2P1001 at [redacted]w[redacted]d who presents to MAU with chief complaint of fever and emesis. These are new problems, onset yesterday 05/31. Patient reports feeling very run down and fatigued all day. Tmax on home thermometer was 100.3 (oral). She has attempted management with PO Tylenol with limited success. She does not have access to antiemetics and is vomiting with all PO attempts.   Patient is s/p Moderna COVID vaccine. She did not receive her booster. She receives prenatal care with [facility].  OB History    Gravida  2   Para  1   Term  1   Preterm      AB      Living  1     SAB      IAB      Ectopic      Multiple  0   Live Births  1           Past Medical History:  Diagnosis Date  . Anemia   . Chronic pelvic pain in female   . H/O chronic cystitis   . History of recurrent UTIs   . History of urethral stricture   . Nephrolithiasis    bilateral non-obstructive per urologist note 06-16-2017  -- dr Marlou Porch    Past Surgical History:  Procedure Laterality Date  . AUGMENTATION MAMMAPLASTY Bilateral 2009  . CHROMOPERTUBATION N/A 08/12/2017   Procedure: CHROMOPERTUBATION;  Surgeon: Mitchel Honour, DO;  Location: [facility];  Service: Gynecology;  Laterality: N/A;  . CYSTO/  URETHRAL DILATION  11/09/2004  . DILATION AND EVACUATION N/A 11/11/2018   Procedure: DILATATION AND EVACUATION;  Surgeon: Zelphia Cairo, MD;  Location: [facility] BIRTHING SU[facility];  Service: Gynecology;  Laterality: N/A;  . LAPAROSCOPY N/A 08/12/2017   Procedure: LAPAROSCOPY DIAGNOSTIC [place] OF ENDOMETRIOSI;  Surgeon: Mitchel Honour, DO;  Location: [facility];  Service: Gynecology;  Laterality: N/A;  . WISDOM TOOTH EXTRACTION  03/2016    Family History  Problem Relation Age of Onset   . Diabetes Maternal Grandmother   . Diabetes Maternal Grandfather   . Cancer Paternal Grandmother   . Diabetes Paternal Grandmother   . Cancer Paternal Grandfather   . Diabetes Paternal Grandfather     Social History   Tobacco Use  . Smoking status: Never Smoker  . Smokeless tobacco: Never Used  Vaping Use  . Vaping Use: Never used  Substance Use Topics  . Alcohol use: No    Alcohol/week: 0.0 standard drinks  . Drug use: No    Allergies:  Allergies  Allergen Reactions  . Amoxicillin Hives    Has patient had a PCN reaction causing immediate rash, facial/tongue/throat swelling, SOB or lightheadedness with hypotension: Yes Has patient had a PCN reaction causing severe rash involving mucus membranes or skin necrosis: Yes Has patient had a PCN reaction that required hospitalization: No Has patient had a PCN reaction occurring within the last 10 years: No If all of the above answers are "NO", then may proceed with Cephalosporin use.  Marland Kitchen Penicillins Hives    Has patient had a PCN reaction causing immediate rash, facial/tongue/throat swelling, SOB or lightheadedness with hypotension: Yes Has patient had a PCN reaction causing severe rash involving mucus membranes or skin necrosis: Yes Has  patient had a PCN reaction that required hospitalization: No Has patient had a PCN reaction occurring within the last 10 years: No If all of the above answers are "NO", then may proceed with Cephalosporin use.   . Sulfa Antibiotics Hives    Medications Prior to Admission  Medication Sig Dispense Refill Last Dose  . Prenatal Vit-Fe Fumarate-FA (PRENATAL MULTIVITAMIN) TABS tablet Take 1 tablet by mouth daily at 12 noon.   05/16/2021 at Unknown time  . Iron Combinations (HEMATOGEN) CAPS Take by mouth daily.  1     Review of Systems  Constitutional: Positive for fatigue and fever.  Gastrointestinal: Positive for nausea and vomiting. Negative for abdominal pain.  All other systems reviewed and are  negative.  Physical Exam   Blood pressure 120/67, pulse (!) 103, temperature 98.8 F (37.1 C), temperature source Oral, resp. rate 16, height 5\' 5"  (1.651 m), weight 68.5 kg, SpO2 100 %, unknown if currently breastfeeding.  Physical Exam Vitals and nursing note reviewed. Exam conducted with a chaperone present.  Constitutional:      Appearance: Normal appearance. She is not ill-appearing.  Cardiovascular:     Rate and Rhythm: Normal rate.     Pulses: Normal pulses.  Pulmonary:     Effort: Pulmonary effort is normal.  Skin:    Capillary Refill: Capillary refill takes less than 2 seconds.  Neurological:     General: No focal deficit present.     Mental Status: She is alert.  Psychiatric:        Mood and Affect: Mood normal.        Behavior: Behavior normal.        Thought Content: Thought content normal.        Judgment: Judgment normal.     MAU Course  Procedures  --+ Cardiac flicker via BSUS  Orders Placed This Encounter  Procedures  . Resp Panel by RT-PCR (Flu A&B, Covid) Nasopharyngeal Swab  . Urinalysis, Routine w reflex microscopic Urine, Clean Catch  . CBC with Differential/Platelet  . Comprehensive metabolic panel  . Airborne and Contact precautions  . Pregnancy, urine POC  . Blood draw with IV start   Results for orders placed or performed during the hospital encounter of 05/16/21 (from the past 24 hour(s))  Pregnancy, urine POC     Status: Abnormal   Collection Time: 05/16/21  3:22 PM  Result Value Ref Range   Preg Test, Ur POSITIVE (A) NEGATIVE  Urinalysis, Routine w reflex microscopic Urine, Clean Catch     Status: Abnormal   Collection Time: 05/16/21  3:23 PM  Result Value Ref Range   Color, Urine YELLOW YELLOW   APPearance HAZY (A) CLEAR   Specific Gravity, Urine 1.021 1.005 - 1.030   pH 5.0 5.0 - 8.0   Glucose, UA NEGATIVE NEGATIVE mg/dL   Hgb urine dipstick NEGATIVE NEGATIVE   Bilirubin Urine NEGATIVE NEGATIVE   Ketones, ur 80 (A) NEGATIVE  mg/dL   Protein, ur NEGATIVE NEGATIVE mg/dL   Nitrite NEGATIVE NEGATIVE   Leukocytes,Ua NEGATIVE NEGATIVE  CBC with Differential/Platelet     Status: Abnormal   Collection Time: 05/16/21  6:29 PM  Result Value Ref Range   WBC 9.2 4.0 - 10.5 K/uL   RBC 4.22 3.87 - 5.11 MIL/uL   Hemoglobin 12.8 12.0 - 15.0 g/dL   HCT 07/16/21 54.2 - 70.6 %   MCV 91.0 80.0 - 100.0 fL   MCH 30.3 26.0 - 34.0 pg   MCHC 33.3 30.0 - 36.0  g/dL   RDW 20.2 54.2 - 70.6 %   Platelets 320 150 - 400 K/uL   nRBC 0.0 0.0 - 0.2 %   Neutrophils Relative % 86 %   Neutro Abs 7.9 (H) 1.7 - 7.7 K/uL   Lymphocytes Relative 6 %   Lymphs Abs 0.6 (L) 0.7 - 4.0 K/uL   Monocytes Relative 7 %   Monocytes Absolute 0.7 0.1 - 1.0 K/uL   Eosinophils Relative 0 %   Eosinophils Absolute 0.0 0.0 - 0.5 K/uL   Basophils Relative 0 %   Basophils Absolute 0.0 0.0 - 0.1 K/uL   Immature Granulocytes 1 %   Abs Immature Granulocytes 0.08 (H) 0.00 - 0.07 K/uL  Comprehensive metabolic panel     Status: Abnormal   Collection Time: 05/16/21  6:29 PM  Result Value Ref Range   Sodium 135 135 - 145 mmol/L   Potassium 3.4 (L) 3.5 - 5.1 mmol/L   Chloride 99 98 - 111 mmol/L   CO2 26 22 - 32 mmol/L   Glucose, Bld 88 70 - 99 mg/dL   BUN 7 6 - 20 mg/dL   Creatinine, Ser 2.37 0.44 - 1.00 mg/dL   Calcium 9.1 8.9 - 62.8 mg/dL   Total Protein 6.8 6.5 - 8.1 g/dL   Albumin 3.6 3.5 - 5.0 g/dL   AST 17 15 - 41 U/L   ALT 19 0 - 44 U/L   Alkaline Phosphatase 58 38 - 126 U/L   Total Bilirubin 0.7 0.3 - 1.2 mg/dL   GFR, Estimated >31 >51 mL/min   Anion gap 10 5 - 15   Meds ordered this encounter  Medications  . multivitamins adult (INFUVITE ADULT) 10 mL in lactated ringers 1,000 mL infusion  . promethazine (PHENERGAN) 12.5 mg in sodium chloride 0.9 % 50 mL IVPB  . lactated ringers bolus 500 mL    For Phenergan IVPB  . acetaminophen (TYLENOL) tablet 650 mg    Assessment and Plan  --35 y.o. G2P1001 at 103w6d  --COVID +, no acute symptoms --+ Cardiac  flicker on bedside US --Ketonuria in setting of recurrent vomiting --S/p multivitamin bag and Phenergan IVPB in MAU --Very mild Hypokalemia, will correct with management of emesis --Tolerating PO prior to discharge --Discharge home in stable condition  Clayton Bibles, MSN, CNM Certified Nurse Midwife, Biochemist, clinical for [employer], [facility]

## 2021-05-16 NOTE — Discharge Instructions (Signed)
Follow these instructions at home: To help relieve your symptoms, listen to your body. Everyone is different and has different preferences. Find what works best for you. Here are some things you can try to help relieve your symptoms: Meals and snacks  Eat 5-6 small meals daily instead of 3 large meals. Eating small meals and snacks can help you avoid an empty stomach.  Before getting out of bed, eat a couple of crackers to avoid moving around on an empty stomach.  Eat a protein-rich snack before bed. Examples include cheese and crackers, or a peanut butter sandwich made with 1 slice of whole-wheat bread and 1 tsp (5 g) of peanut butter.  Eat and drink slowly.  Try eating starchy foods as these are usually tolerated well. Examples include cereal, toast, bread, potatoes, pasta, rice, and pretzels.  Eat at least one serving of protein with your meals and snacks. Protein options include lean meats, poultry, seafood, beans, nuts, nut butters, eggs, cheese, and yogurt.  Eat or suck on things that have ginger in them. It may help to relieve nausea. Add  tsp (0.44 g) ground ginger to hot tea, or choose ginger tea.   Fluids It is important to stay hydrated. Try to:  Drink small amounts of fluids often.  Drink fluids 30 minutes before or after a meal to help lessen the feeling of a full stomach.  Drink 100% fruit juice or an electrolyte drink. An electrolyte drink contains sodium, potassium, and chloride.  Drink fluids that are cold, clear, and carbonated or sour. These include lemonade, ginger ale, lemon-lime soda, ice water, and sparkling water. Things to avoid Avoid the following:  Eating foods that trigger your symptoms. These may include spicy foods, coffee, high-fat foods, very sweet foods, and acidic foods.  Drinking more than 1 cup of fluid at a time.  Skipping meals. Nausea can be more intense on an empty stomach. If you cannot tolerate food, do not force it. Try sucking on ice  chips or other frozen items and make up for missed calories later.  Lying down within 2 hours after eating.  Being exposed to environmental triggers. These may include food smells, smoky rooms, closed spaces, rooms with strong smells, warm or humid places, overly loud and noisy rooms, and rooms with motion or flickering lights. Try eating meals in a well-ventilated area that is free of strong smells.  Making quick and sudden changes in your movement.  Taking iron pills and multivitamins that contain iron. If you take prescription iron pills, do not stop taking them unless your health care provider approves.  Preparing food. The smell of food can spoil your appetite or trigger nausea. General instructions  Brush your teeth or use a mouth rinse after meals.  Take over-the-counter and prescription medicines only as told by your health care provider.  Follow instructions from your health care provider about eating or drinking restrictions.  Talk with your health care provider about starting a supplement of vitamin B6.  Continue to take your prenatal vitamins as told by your health care provider. If you are having trouble taking your prenatal vitamins, talk with your health care provider about other options.  Keep all follow-up visits. This is important. Follow-up visits include prenatal visits. Contact a health care provider if:  You have pain in your abdomen.  You have a severe headache.  You have vision problems.  You are losing weight.  You feel weak or dizzy.  You cannot eat or drink without  vomiting, especially if this goes on for a full day. Get help right away if:  You cannot drink fluids without vomiting.  You vomit blood.  You have constant nausea and vomiting.  You are very weak.  You faint.  You have a fever and your symptoms suddenly get worse. Summary  Making some changes to your eating habits may help relieve nausea and vomiting.  This condition may be  managed with lifestyle changes and medicines as prescribed by your health care provider.  If medicines do not help relieve nausea and vomiting, you may need to receive fluids through an IV at the hospital. This information is not intended to replace advice given to you by your health care provider. Make sure you discuss any questions you have with your health care provider. Document Revised: 06/26/2020 Document Reviewed: 06/26/2020 Elsevier Patient Education  2021 Elsevier Inc.   Viral Illness, Adult Viruses are tiny germs that can get into a person's body and cause illness. There are many different types of viruses, and they cause many types of illness. Viral illnesses can range from mild to severe. They can affect various parts of the body. Short-term conditions that are caused by a virus include colds and the flu (influenza). Long-term conditions that are caused by a virus include herpes, shingles, and HIV (human immunodeficiency virus) infection. A few viruses have been linked to certain cancers. What are the causes? Many types of viruses can cause illness. Viruses invade cells in your body, multiply, and cause the infected cells to work abnormally or die. When these cells die, they release more of the virus. When this happens, you develop symptoms of the illness, and the virus continues to spread to other cells. If the virus takes over the function of the cell, it can cause the cell to divide and grow out of control. This happens when a virus causes cancer. Different viruses get into the body in different ways. You can get a virus by:  Swallowing food or water that has come in contact with the virus (is contaminated).  Breathing in droplets that have been coughed or sneezed into the air by an infected person.  Touching a surface that has been contaminated with the virus and then touching your eyes, nose, or mouth.  Being bitten by an insect or animal that carries the virus.  Having  sexual contact with a person who is infected with the virus.  Being exposed to blood or fluids that contain the virus, either through an open cut or during a transfusion. If a virus enters your body, your body's defense system (immune system) will try to fight the virus. You may be at higher risk for a viral illness if your immune system is weak. What are the signs or symptoms? You may have these symptoms, depending on the type of virus and the location of the cells that it invades:  Cold and flu viruses: ? Fever. ? Headache. ? Sore throat. ? Muscle aches. ? Stuffy nose (nasal congestion). ? Cough.  Digestive system (gastrointestinal) viruses: ? Fever. ? Pain in the abdomen. ? Nausea. ? Diarrhea.  Liver viruses (hepatitis): ? Loss of appetite. ? Tiredness. ? Skin or the white parts of your eyes turning yellow (jaundice).  Brain and spinal cord viruses: ? Fever. ? Headache. ? Stiff neck. ? Nausea and vomiting. ? Confusion or sleepiness.  Skin viruses: ? Warts. ? Itching. ? Rash.  Sexually transmitted viruses: ? Discharge. ? Swelling. ? Redness. ? Rash. How is  this diagnosed? This condition may be diagnosed based on one or more of the following:  Symptoms.  Medical history.  Physical exam.  Blood test, sample of mucus from your lungs (sputum sample), stool sample, or a swab of body fluids or a skin sore (lesion). How is this treated? Viruses can be hard to treat because they live within cells. Antibiotic medicines do not treat viruses because these medicines do not get inside cells. Treatment for a viral illness may include:  Resting and drinking plenty of fluids.  Medicines to relieve symptoms. These can include over-the-counter medicine for pain and fever, medicines for cough or congestion, and medicines to relieve diarrhea.  Antiviral medicines. These medicines are available only for certain types of viruses. Some viral illnesses can be prevented with  vaccinations. A common example is the flu shot. Follow these instructions at home: Medicines  Take over-the-counter and prescription medicines only as told by your health care provider.  If you were prescribed an antiviral medicine, take it as told by your health care provider. Do not stop taking the antiviral even if you start to feel better.  Be aware of when antibiotics are needed and when they are not needed. Antibiotics do not treat viruses. You may get an antibiotic if your health care provider thinks that you may have, or are at risk for, a bacterial infection and you have a viral infection. ? Do not ask for an antibiotic prescription if you have been diagnosed with a viral illness. Antibiotics will not make your illness go away faster. ? Frequently taking antibiotics when they are not needed can lead to antibiotic resistance. When this develops, the medicine no longer works against the bacteria that it normally fights. General instructions  Drink enough fluids to keep your urine pale yellow.  Rest as much as possible.  Return to your normal activities as told by your health care provider. Ask your health care provider what activities are safe for you.  Keep all follow-up visits as told by your health care provider. This is important.   How is this prevented? To reduce your risk of viral illness:  Wash your hands often with soap and water for at least 20 seconds. If soap and water are not available, use hand sanitizer.  Avoid touching your nose, eyes, and mouth, especially if you have not washed your hands recently.  If anyone in your household has a viral infection, clean all household surfaces that may have been in contact with the virus. Use soap and hot water. You may also use bleach that you have added water to (diluted).  Stay away from people who are sick with symptoms of a viral infection.  Do not share items such as toothbrushes and water bottles with other  people.  Keep your vaccinations up to date. This includes getting a yearly flu shot.  Eat a healthy diet and get plenty of rest.   Contact a health care provider if:  You have symptoms of a viral illness that do not go away.  Your symptoms come back after going away.  Your symptoms get worse. Get help right away if you have:  Trouble breathing.  A severe headache or a stiff neck.  Severe vomiting or pain in your abdomen. These symptoms may represent a serious problem that is an emergency. Do not wait to see if the symptoms will go away. Get medical help right away. Call your local emergency services (911 in the [place] Do not drive yourself  to the hospital. Summary  Viruses are types of germs that can get into a person's body and cause illness. Viral illnesses can range from mild to severe. They can affect various parts of the body.  Viruses can be hard to treat. There are medicines to relieve symptoms, and there are some antiviral medicines.  If you were prescribed an antiviral medicine, take it as told by your health care provider. Do not stop taking the antiviral even if you start to feel better.  Contact a health care provider if you have symptoms of a viral illness that do not go away. This information is not intended to replace advice given to you by your health care provider. Make sure you discuss any questions you have with your health care provider. Document Revised: 04/17/2020 Document Reviewed: 10/12/2019 Elsevier Patient Education  2021 Elsevier Inc.  Safe Medications in Pregnancy   Acne: Benzoyl Peroxide Salicylic Acid  Backache/Headache: Tylenol: 2 regular strength every 4 hours OR              2 Extra strength every 6 hours  Colds/Coughs/Allergies: Benadryl (alcohol free) 25 mg every 6 hours as needed Breath right strips Claritin Cepacol throat lozenges Chloraseptic throat spray Cold-Eeze- up to three times per day Cough drops, alcohol free Flonase  (by prescription only) Guaifenesin Mucinex Robitussin DM (plain only, alcohol free) Saline nasal spray/drops Sudafed (pseudoephedrine) & Actifed ** use only after [redacted] weeks gestation and if you do not have high blood pressure Tylenol Vicks Vaporub Zinc lozenges Zyrtec   Constipation: Colace Ducolax suppositories Fleet enema Glycerin suppositories Metamucil Milk of magnesia Miralax Senokot Smooth move tea  Diarrhea: Kaopectate Imodium A-D  *NO pepto Bismol  Hemorrhoids: Anusol Anusol HC Preparation H Tucks  Indigestion: Tums Maalox Mylanta Zantac  Pepcid  Insomnia: Benadryl (alcohol free) 25mg  every 6 hours as needed Tylenol PM Unisom, no Gelcaps  Leg Cramps: Tums MagGel  Nausea/Vomiting:  Bonine Dramamine Emetrol Ginger extract Sea bands Meclizine  Nausea medication to take during pregnancy:  Unisom (doxylamine succinate 25 mg tablets) Take one tablet daily at bedtime. If symptoms are not adequately controlled, the dose can be increased to a maximum recommended dose of two tablets daily (1/2 tablet in the morning, 1/2 tablet mid-afternoon and one at bedtime). Vitamin B6 100mg  tablets. Take one tablet twice a day (up to 200 mg per day).  Skin Rashes: Aveeno products Benadryl cream or 25mg  every 6 hours as needed Calamine Lotion 1% cortisone cream  Yeast infection: Gyne-lotrimin 7 Monistat 7   **If taking multiple medications, please check labels to avoid duplicating the same active ingredients **take medication as directed on the label ** Do not exceed 4000 mg of tylenol in 24 hours **Do not take medications that contain aspirin or ibuprofen

## 2021-05-17 ENCOUNTER — Telehealth: Payer: 59 | Admitting: Physician Assistant

## 2021-05-17 DIAGNOSIS — U071 COVID-19: Secondary | ICD-10-CM

## 2021-05-17 NOTE — Progress Notes (Signed)
For the safety of you and your child, I recommend a face to face office visit with a health care provider.  Many mothers need to take medicines during their pregnancy and while nursing.  Almost all medicines pass into the breast milk in small quantities.  Most are generally considered safe for a mother to take but some medicines must be avoided.  After reviewing your E-Visit request, I recommend that you consult your OB/GYN or pediatrician for medical advice in relation to your condition and prescription medications while pregnant or breastfeeding. NOTE: If you entered your credit card information for this eVisit, you will not be charged. You may see a "hold" on your card for the $35 but that hold will drop off and you will not have a charge processed.  If you are having a true medical emergency please call 911.    For an urgent face to face visit, [facility] has six urgent care centers for your convenience:     [facility] [place] Get Driving Directions [phone] [address] [place], [place] [address] . 8 am - 4 pm Monday - Friday    [facility] ([place]) Get Driving Directions [phone] [address] [place], [place] [address] . 8 am to 8 pm Monday-Friday . 10 am to 6 pm Saturday-Sunday  [facility] ([place] [facility] [place]) Get Driving Directions [phone]  [address] [place],  [place]  [address] . 8 am to 8 pm Monday-Friday . 8 am to 4 pm Saturday-Sunday  [facility] Get Driving Directions [phone] [address], [address] [place], [place] [address] . 8 am to 8 pm Monday-Friday . 8 am to 4 pm Saturday-Sunday   [facility] [place] Get Driving Directions  [phone] [address] [place], [place] [address] . 8 am to 8 pm Monday-Friday . 8 am to 4 pm Saturday-Sunday     [facility] [place] Get Driving Directions [phone] [address] [place], [place] [address] . 8 am to 8 pm Monday-Friday . 8 am to 4 pm Saturday-Sunday     Your MyChart E-visit questionnaire answers were reviewed by a board certified advanced clinical practitioner to complete your personal care plan based on your specific symptoms.  Thank you for using e-Visits.   I provided 5 minutes of non face-to-face time during this encounter for chart review and documentation.  

## 2021-05-30 LAB — OB RESULTS CONSOLE GC/CHLAMYDIA
Chlamydia: NEGATIVE
Gonorrhea: NEGATIVE

## 2021-11-21 LAB — OB RESULTS CONSOLE GBS: GBS: NEGATIVE

## 2021-12-10 ENCOUNTER — Other Ambulatory Visit: Payer: Self-pay

## 2021-12-11 ENCOUNTER — Encounter (HOSPITAL_COMMUNITY): Payer: Self-pay | Admitting: Obstetrics and Gynecology

## 2021-12-11 ENCOUNTER — Inpatient Hospital Stay (HOSPITAL_COMMUNITY): Payer: No Typology Code available for payment source

## 2021-12-11 ENCOUNTER — Inpatient Hospital Stay (HOSPITAL_COMMUNITY)
Admission: AD | Admit: 2021-12-11 | Discharge: 2021-12-12 | DRG: 807 | Disposition: A | Discharge status: Home / Self Care | Payer: No Typology Code available for payment source | Attending: Obstetrics and Gynecology | Admitting: Obstetrics and Gynecology

## 2021-12-11 ENCOUNTER — Inpatient Hospital Stay (HOSPITAL_COMMUNITY): Payer: No Typology Code available for payment source | Admitting: Anesthesiology

## 2021-12-11 DIAGNOSIS — Z880 Allergy status to penicillin: Secondary | ICD-10-CM

## 2021-12-11 DIAGNOSIS — Z3A39 39 weeks gestation of pregnancy: Secondary | ICD-10-CM | POA: Diagnosis not present

## 2021-12-11 DIAGNOSIS — Z20822 Contact with and (suspected) exposure to covid-19: Secondary | ICD-10-CM | POA: Diagnosis present

## 2021-12-11 DIAGNOSIS — O26893 Other specified pregnancy related conditions, third trimester: Principal | ICD-10-CM | POA: Diagnosis present

## 2021-12-11 DIAGNOSIS — Z3490 Encounter for supervision of normal pregnancy, unspecified, unspecified trimester: Secondary | ICD-10-CM

## 2021-12-11 HISTORY — DX: Anxiety disorder, unspecified: F41.9

## 2021-12-11 HISTORY — DX: Encounter for supervision of normal pregnancy, unspecified, unspecified trimester: Z34.90

## 2021-12-11 HISTORY — DX: Depression, unspecified: F32.A

## 2021-12-11 LAB — CBC
HCT: 37.8 % (ref 36.0–46.0)
Hemoglobin: 12.7 g/dL (ref 12.0–15.0)
MCH: 29.6 pg (ref 26.0–34.0)
MCHC: 33.6 g/dL (ref 30.0–36.0)
MCV: 88.1 fL (ref 80.0–100.0)
Platelets: 381 10*3/uL (ref 150–400)
RBC: 4.29 MIL/uL (ref 3.87–5.11)
RDW: 16 % — ABNORMAL HIGH (ref 11.5–15.5)
WBC: 15.7 10*3/uL — ABNORMAL HIGH (ref 4.0–10.5)
nRBC: 0 % (ref 0.0–0.2)

## 2021-12-11 LAB — TYPE AND SCREEN
ABO/RH(D): O POS
Antibody Screen: NEGATIVE

## 2021-12-11 LAB — RESP PANEL BY RT-PCR (FLU A&B, COVID) ARPGX2
Influenza A by PCR: NEGATIVE
Influenza B by PCR: NEGATIVE
SARS Coronavirus 2 by RT PCR: NEGATIVE

## 2021-12-11 LAB — RPR: RPR Ser Ql: NONREACTIVE

## 2021-12-11 MED ORDER — TERBUTALINE SULFATE 1 MG/ML IJ SOLN: 0.2500 mg | Freq: Once | INTRAMUSCULAR | Status: DC | PRN

## 2021-12-11 MED ORDER — OXYTOCIN-SODIUM CHLORIDE 30-0.9 UT/500ML-% IV SOLN
1.0000 m[IU]/min | INTRAVENOUS | Status: DC
Start: 2021-12-11 — End: 2021-12-11
  Administered 2021-12-11: 06:00:00 2 m[IU]/min via INTRAVENOUS
  Filled 2021-12-11: qty 500

## 2021-12-11 MED ORDER — WITCH HAZEL-GLYCERIN EX PADS: 1.0000 "application " | MEDICATED_PAD | CUTANEOUS | Status: DC | PRN

## 2021-12-11 MED ORDER — BUTORPHANOL TARTRATE 1 MG/ML IJ SOLN: 1.0000 mg | INTRAMUSCULAR | Status: DC | PRN

## 2021-12-11 MED ORDER — EPHEDRINE 5 MG/ML INJ: 10.0000 mg | INTRAVENOUS | Status: DC | PRN

## 2021-12-11 MED ORDER — BENZOCAINE-MENTHOL 20-0.5 % EX AERO
1.0000 "application " | INHALATION_SPRAY | CUTANEOUS | Status: DC | PRN
  Administered 2021-12-11: 1 via TOPICAL
  Filled 2021-12-11: qty 56

## 2021-12-11 MED ORDER — LIDOCAINE HCL (PF) 1 % IJ SOLN
INTRAMUSCULAR | Status: DC | PRN
  Administered 2021-12-11: 10 mL via EPIDURAL

## 2021-12-11 MED ORDER — IBUPROFEN 600 MG PO TABS
600.0000 mg | ORAL_TABLET | Freq: Four times a day (QID) | ORAL | Status: DC
  Administered 2021-12-11 – 2021-12-12 (×4): 600 mg via ORAL
  Filled 2021-12-11 (×4): qty 1

## 2021-12-11 MED ORDER — PRENATAL MULTIVITAMIN CH
1.0000 | ORAL_TABLET | Freq: Every day | ORAL | Status: DC
  Administered 2021-12-11: 18:00:00 1 via ORAL
  Filled 2021-12-11: qty 1

## 2021-12-11 MED ORDER — DIPHENHYDRAMINE HCL 50 MG/ML IJ SOLN: 12.5000 mg | INTRAMUSCULAR | Status: DC | PRN

## 2021-12-11 MED ORDER — HYDROXYZINE HCL 50 MG PO TABS: 50.0000 mg | ORAL_TABLET | Freq: Four times a day (QID) | ORAL | Status: DC | PRN

## 2021-12-11 MED ORDER — LACTATED RINGERS IV SOLN: INTRAVENOUS | Status: DC

## 2021-12-11 MED ORDER — DIBUCAINE (PERIANAL) 1 % EX OINT: 1.0000 "application " | TOPICAL_OINTMENT | CUTANEOUS | Status: DC | PRN

## 2021-12-11 MED ORDER — DIPHENHYDRAMINE HCL 25 MG PO CAPS: 25.0000 mg | ORAL_CAPSULE | Freq: Four times a day (QID) | ORAL | Status: DC | PRN

## 2021-12-11 MED ORDER — SENNOSIDES-DOCUSATE SODIUM 8.6-50 MG PO TABS
2.0000 | ORAL_TABLET | ORAL | Status: DC
  Administered 2021-12-11: 18:00:00 2 via ORAL
  Filled 2021-12-11: qty 2

## 2021-12-11 MED ORDER — LACTATED RINGERS IV SOLN: 500.0000 mL | Freq: Once | INTRAVENOUS | Status: DC

## 2021-12-11 MED ORDER — OXYCODONE-ACETAMINOPHEN 5-325 MG PO TABS: 2.0000 | ORAL_TABLET | ORAL | Status: DC | PRN

## 2021-12-11 MED ORDER — PHENYLEPHRINE 40 MCG/ML (10ML) SYRINGE FOR IV PUSH (FOR BLOOD PRESSURE SUPPORT): 80.0000 ug | PREFILLED_SYRINGE | INTRAVENOUS | Status: DC | PRN

## 2021-12-11 MED ORDER — TETANUS-DIPHTH-ACELL PERTUSSIS 5-2.5-18.5 LF-MCG/0.5 IM SUSY: 0.5000 mL | PREFILLED_SYRINGE | Freq: Once | INTRAMUSCULAR | Status: DC

## 2021-12-11 MED ORDER — ONDANSETRON HCL 4 MG PO TABS: 4.0000 mg | ORAL_TABLET | ORAL | Status: DC | PRN

## 2021-12-11 MED ORDER — PHENYLEPHRINE 40 MCG/ML (10ML) SYRINGE FOR IV PUSH (FOR BLOOD PRESSURE SUPPORT)
PREFILLED_SYRINGE | INTRAVENOUS | Status: AC
  Filled 2021-12-11: qty 10

## 2021-12-11 MED ORDER — ACETAMINOPHEN 325 MG PO TABS: 650.0000 mg | ORAL_TABLET | ORAL | Status: DC | PRN

## 2021-12-11 MED ORDER — FENTANYL-BUPIVACAINE-NACL 0.5-0.125-0.9 MG/250ML-% EP SOLN
12.0000 mL/h | EPIDURAL | Status: DC | PRN
  Administered 2021-12-11: 08:00:00 12 mL/h via EPIDURAL

## 2021-12-11 MED ORDER — SOD CITRATE-CITRIC ACID 500-334 MG/5ML PO SOLN: 30.0000 mL | ORAL | Status: DC | PRN

## 2021-12-11 MED ORDER — OXYCODONE-ACETAMINOPHEN 5-325 MG PO TABS: 1.0000 | ORAL_TABLET | ORAL | Status: DC | PRN

## 2021-12-11 MED ORDER — ONDANSETRON HCL 4 MG/2ML IJ SOLN: 4.0000 mg | INTRAMUSCULAR | Status: DC | PRN

## 2021-12-11 MED ORDER — ONDANSETRON HCL 4 MG/2ML IJ SOLN: 4.0000 mg | Freq: Four times a day (QID) | INTRAMUSCULAR | Status: DC | PRN

## 2021-12-11 MED ORDER — MISOPROSTOL 25 MCG QUARTER TABLET
25.0000 ug | ORAL_TABLET | ORAL | Status: DC | PRN
  Filled 2021-12-11: qty 1

## 2021-12-11 MED ORDER — COCONUT OIL OIL: 1.0000 "application " | TOPICAL_OIL | Status: DC | PRN

## 2021-12-11 MED ORDER — OXYTOCIN BOLUS FROM INFUSION
333.0000 mL | Freq: Once | INTRAVENOUS | Status: AC
  Administered 2021-12-11: 10:00:00 333 mL via INTRAVENOUS

## 2021-12-11 MED ORDER — SIMETHICONE 80 MG PO CHEW: 80.0000 mg | CHEWABLE_TABLET | ORAL | Status: DC | PRN

## 2021-12-11 MED ORDER — ACETAMINOPHEN 325 MG PO TABS
650.0000 mg | ORAL_TABLET | ORAL | Status: DC | PRN
  Administered 2021-12-11 (×2): 650 mg via ORAL
  Filled 2021-12-11 (×2): qty 2

## 2021-12-11 MED ORDER — LACTATED RINGERS IV SOLN: 500.0000 mL | INTRAVENOUS | Status: DC | PRN

## 2021-12-11 MED ORDER — LIDOCAINE HCL (PF) 1 % IJ SOLN: 30.0000 mL | INTRAMUSCULAR | Status: DC | PRN

## 2021-12-11 MED ORDER — OXYTOCIN-SODIUM CHLORIDE 30-0.9 UT/500ML-% IV SOLN: 2.5000 [IU]/h | INTRAVENOUS | Status: DC

## 2021-12-11 MED ORDER — ZOLPIDEM TARTRATE 5 MG PO TABS: 5.0000 mg | ORAL_TABLET | Freq: Every evening | ORAL | Status: DC | PRN

## 2021-12-11 MED ORDER — FENTANYL-BUPIVACAINE-NACL 0.5-0.125-0.9 MG/250ML-% EP SOLN
EPIDURAL | Status: AC
  Filled 2021-12-11: qty 250

## 2021-12-11 NOTE — Anesthesia Preprocedure Evaluation (Signed)
Anesthesia Evaluation  Patient identified by MRN, date of birth, ID band Patient awake    Reviewed: Allergy & Precautions, H&P , NPO status , Patient's Chart, lab work & pertinent test results  History of Anesthesia Complications Negative for: history of anesthetic complications  Airway Mallampati: II  TM Distance: >3 FB     Dental   Pulmonary neg pulmonary ROS,    Pulmonary exam normal        Cardiovascular negative cardio ROS   Rhythm:regular Rate:Normal     Neuro/Psych negative neurological ROS  negative psych ROS   GI/Hepatic negative GI ROS, Neg liver ROS,   Endo/Other  negative endocrine ROS  Renal/GU negative Renal ROS  negative genitourinary   Musculoskeletal   Abdominal   Peds  Hematology negative hematology ROS (+)   Anesthesia Other Findings   Reproductive/Obstetrics (+) Pregnancy                             Anesthesia Physical Anesthesia Plan  ASA: 2  Anesthesia Plan: Epidural   Post-op Pain Management:    Induction:   PONV Risk Score and Plan:   Airway Management Planned:   Additional Equipment:   Intra-op Plan:   Post-operative Plan:   Informed Consent: I have reviewed the patients History and Physical, chart, labs and discussed the procedure including the risks, benefits and alternatives for the proposed anesthesia with the patient or authorized representative who has indicated his/her understanding and acceptance.       Plan Discussed with:   Anesthesia Plan Comments:         Anesthesia Quick Evaluation  

## 2021-12-11 NOTE — Anesthesia Procedure Notes (Signed)
Epidural Patient location during procedure: OB Start time: 12/11/2021 8:03 AM End time: 12/11/2021 8:12 AM  Staffing Anesthesiologist: Lucretia Kern, MD Performed: anesthesiologist   Preanesthetic Checklist Completed: patient identified, IV checked, risks and benefits discussed, monitors and equipment checked, pre-op evaluation and timeout performed  Epidural Patient position: sitting Prep: DuraPrep Patient monitoring: heart rate, continuous pulse ox and blood pressure Approach: midline Location: L3-L4 Injection technique: LOR air  Needle:  Needle type: Tuohy  Needle gauge: 17 G Needle length: 9 cm Needle insertion depth: 5 cm Catheter type: closed end flexible Catheter size: 19 Gauge Catheter at skin depth: 10 cm Test dose: negative  Assessment Events: blood not aspirated, injection not painful, no injection resistance, no paresthesia and negative IV test  Additional Notes Reason for block:procedure for pain

## 2021-12-11 NOTE — Lactation Note (Signed)
This note was copied from a baby's chart. Lactation Consultation Note  Patient Name: Stacey Houston [id number] Date: 12/11/2021 Reason for consult: L&D Initial assessment Age:33 hours   L&D Consult:  Arrived > 1 hour after birth Baby quiet and awake on mother's chest.  Attempted to latch multiple times, however, baby was not interested.  Burped and attempted again with the same results.  Placed him STS on mother's chest.  Reassurance given.  Mother may call for lactation assistance on the M/B unit.  Allowed time for family bonding.   Maternal Data    Feeding Mother's Current Feeding Choice: Breast Milk  LATCH Score Latch: Too sleepy or reluctant, no latch achieved, no sucking elicited.  Audible Swallowing: None  Type of Nipple: Everted at rest and after stimulation  Comfort (Breast/Nipple): Soft / non-tender  Hold (Positioning): Assistance needed to correctly position infant at breast and maintain latch.  LATCH Score: 5   Lactation Tools Discussed/Used    Interventions Interventions: Assisted with latch;Skin to skin  Discharge    Consult Status Consult Status: Follow-up from L&D    Taviana Westergren R Otha Rickles 12/11/2021, 10:44 AM

## 2021-12-11 NOTE — H&P (Signed)
OB History and Physical   Stacey Houston is a 35 y.o. female G2P1001 presenting for elective IOL at [redacted]w[redacted]d.  Cervix previously 2 cm in office, upon presentation noted to be 4-5/70/-3 with bulging bag.  Patient reports contractions, denies VB or LOF. She reports good FM.  Pregnancy course has been uncomplicated.  EFW 70%ile at [redacted]w[redacted]d.  Rh positive, GBS negative.  Her previous delivery required postpartum D&C for retained POC.    OB History     Gravida  2   Para  1   Term  1   Preterm      AB      Living  1      SAB      IAB      Ectopic      Multiple  0   Live Births  1          Past Medical History:  Diagnosis Date   Anemia    Chronic pelvic pain in female    H/O chronic cystitis    History of recurrent UTIs    History of urethral stricture    Nephrolithiasis    bilateral non-obstructive per urologist note 06-16-2017  -- dr Marlou Porch   Past Surgical History:  Procedure Laterality Date   AUGMENTATION MAMMAPLASTY Bilateral 2009   CHROMOPERTUBATION N/A 08/12/2017   Procedure: CHROMOPERTUBATION;  Surgeon: Mitchel Honour, DO;  Location: [facility];  Service: Gynecology;  Laterality: N/A;   CYSTO/  URETHRAL DILATION  11/09/2004   DILATION AND EVACUATION N/A 11/11/2018   Procedure: DILATATION AND EVACUATION;  Surgeon: Zelphia Cairo, MD;  Location: [facility];  Service: Gynecology;  Laterality: N/A;   LAPAROSCOPY N/A 08/12/2017   Procedure: LAPAROSCOPY DIAGNOSTIC [place] OF ENDOMETRIOSI;  Surgeon: Mitchel Honour, DO;  Location:  S[facility];  Service: Gynecology;  Laterality: N/A;   WISDOM TOOTH EXTRACTION  03/2016   Family History: family history includes Cancer in her paternal grandfather and paternal grandmother; Diabetes in her maternal grandfather, maternal grandmother, paternal grandfather, and paternal grandmother. Social History:  reports that she has never smoked. She has never used smokeless tobacco. She reports  that she does not drink alcohol and does not use drugs.     Maternal Diabetes: No Genetic Screening: Normal Maternal Ultrasounds/Referrals: Normal Fetal Ultrasounds or other Referrals:  None Maternal Substance Abuse:  No Significant Maternal Medications:  None Significant Maternal Lab Results:  Group B Strep negative Other Comments:  None  Review of Systems Denies fever, chills, SOB, CP, N/V/D.  History Dilation: 5 Effacement (%): 70, 80 Station: -3 Exam by:: Mellissa Kohut, RN Blood pressure 111/75, pulse 83, temperature 98.4 F (36.9 C), temperature source Oral, resp. rate 16, height 5' 4.5" (1.638 m), weight 89.9 kg, unknown if currently breastfeeding. Exam Physical Exam  Gen: alert, well appearing, no distress Chest: nonlabored breathing CV: no peripheral edema Abdomen: soft, nontender, ctx present, gravid.  Ext: no evidence of DVT   Prenatal labs: ABO, Rh: --/--/O POS (12/27 0028) Antibody: NEG (12/27 0028) Rubella: Immune (05/23 0000) RPR: Nonreactive (05/23 0000)  HBsAg: Negative (05/23 0000)  HIV: Non-reactive (05/23 0000)  GBS: Negative/-- (12/07 0000)   Assessment/Plan: Admit to Labor and Delivery AROM performed in typical fashion. Head well applied, fluid flear. Epidural when desired, patient does not plan to get epidural at this time.  Anticipate vaginal delivery   Lyn Henri 12/11/2021, 6:18 AM

## 2021-12-11 NOTE — Lactation Note (Signed)
This note was copied from a baby's chart. Lactation Consultation Note  Patient Name: Stacey Houston [id number] Date: 12/11/2021 Reason for consult: Initial assessment;Term;Primapara;1st time breastfeeding Age:33 hours   Lactation Initial Consult:  Baby "Stacey Houston" was finishing a 5 minute feeding when I arrived; noticed a shallow latch with dimpling.  Offered to assist with a deeper latch and mother receptive.  Burped him and attempted to latch again, however, he was no longer interested.  Placed him STS and he fell asleep.  Breast feeding basics reviewed.  Offered to return as needed for latch assistance.  Father present.   Maternal Data  Has patient been taught Hand Expression?: Yes Does the patient have breastfeeding experience prior to this delivery?: No  Feeding Mother's Current Feeding Choice: Breast Milk  LATCH Score Latch: Too sleepy or reluctant, no latch achieved, no sucking elicited.  Audible Swallowing: None  Type of Nipple: Everted at rest and after stimulation  Comfort (Breast/Nipple): Soft / non-tender  Hold (Positioning): Assistance needed to correctly position infant at breast and maintain latch.  LATCH Score: 5   Lactation Tools Discussed/Used Tools: Pump Breast pump type: Manual Reason for Pumping: Mother's request Pumping frequency: PRN  Interventions Interventions: Breast feeding basics reviewed;Assisted with latch;Skin to skin;Breast massage;Hand express;Breast compression;Position options;Support pillows;Adjust position;Education  Discharge Pump: Manual;Personal WIC Program: No  Consult Status Consult Status: Follow-up Date: 12/12/21 Follow-up type: In-patient    Stacey Houston 12/11/2021, 2:25 PM

## 2021-12-11 NOTE — Anesthesia Postprocedure Evaluation (Signed)
Anesthesia Post Note  Patient: Stacey Houston  Procedure(s) Performed: AN AD HOC LABOR EPIDURAL     Patient location during evaluation: Mother Baby Anesthesia Type: Epidural Level of consciousness: awake Pain management: satisfactory to patient Vital Signs Assessment: post-procedure vital signs reviewed and stable Respiratory status: spontaneous breathing Cardiovascular status: stable Anesthetic complications: no   No notable events documented.  Last Vitals:  Vitals:   12/11/21 1400 12/11/21 1800  BP: (!) 109/57 114/65  Pulse: 87 89  Resp: 18 18  Temp: 36.8 C 36.8 C  SpO2: 100% 100%    Last Pain:  Vitals:   12/11/21 1801  TempSrc:   PainSc: 3    Pain Goal:                Epidural/Spinal Function Cutaneous sensation: Normal sensation (12/11/21 1801)  Cephus Shelling

## 2021-12-12 LAB — CBC
HCT: 38.6 % (ref 36.0–46.0)
Hemoglobin: 12.4 g/dL (ref 12.0–15.0)
MCH: 28.9 pg (ref 26.0–34.0)
MCHC: 32.1 g/dL (ref 30.0–36.0)
MCV: 90 fL (ref 80.0–100.0)
Platelets: 335 10*3/uL (ref 150–400)
RBC: 4.29 MIL/uL (ref 3.87–5.11)
RDW: 16.6 % — ABNORMAL HIGH (ref 11.5–15.5)
WBC: 17.3 10*3/uL — ABNORMAL HIGH (ref 4.0–10.5)
nRBC: 0 % (ref 0.0–0.2)

## 2021-12-12 MED ORDER — IBUPROFEN 600 MG PO TABS: 600.0000 mg | ORAL_TABLET | Freq: Four times a day (QID) | ORAL | 0 refills | Status: DC | PRN

## 2021-12-12 MED ORDER — ACETAMINOPHEN 325 MG PO TABS
650.0000 mg | ORAL_TABLET | Freq: Four times a day (QID) | ORAL | 0 refills | Status: DC | PRN
Start: 2021-12-12 — End: 2023-06-03

## 2021-12-12 MED ORDER — PRENATAL MULTIVITAMIN CH: 1.0000 | ORAL_TABLET | Freq: Every day | ORAL | 1 refills | Status: DC

## 2021-12-12 NOTE — Lactation Note (Signed)
This note was copied from a baby's chart. Lactation Consultation Note  Patient Name: Stacey Houston [id number] Date: 12/12/2021 Reason for consult: Follow-up assessment;Term Age:33 hours   Lactation Follow Up Consult:  Baby "[name]" was asleep in mother's arms when I arrived.  He received a circumcision this morning.  Reviewed the possibility of sleepiness for most of the day; informed parents that he may awaken more during the night tonight for feedings.  Mother feels like he is latching/feeding well.  Last LATCH score was an 8; voiding/stooling well.  Mother had no further questions/concerns at this time.  She has our OP phone number if needed.   Maternal Data    Feeding Mother's Current Feeding Choice: Breast Milk  LATCH Score                    Lactation Tools Discussed/Used    Interventions Interventions: Breast feeding basics reviewed;Education  Discharge Discharge Education: Engorgement and breast care Pump: Personal  Consult Status Consult Status: Complete Date: 12/12/21 Follow-up type: Call as needed    Irene Pap Masiyah Engen 12/12/2021, 12:38 PM

## 2021-12-12 NOTE — Social Work (Signed)
CSW received consult for hx of PPD/anxiety-no medications. CSW met with MOB to offer support and complete assessment.    CSW met with MOB at bedside and introduced CSW role. CSW congratulated MOB and FOB. CSW observed MOB sitting up in bed, the infant asleep in the bassinet and FOB sitting in the recliner at bedside. MOB presented calm and welcomed CSW visit with FOB present. CSW inquired how MOB has felt since giving birth. MOB reported feeling good compared to her last birthing experience in 2019. MOB disclosed that she experienced PP anxiety immediately after giving birth and it continued for a long time. MOB shared she did not want anyone to be around the infant except for her husband, she worried a lot and isolated from others. MOB denied having thoughts of harming others or the infant.  FOB interjected to say that MOB would constantly clean and want him to stay home with her and the infant. FOB shared he was worried and concern about MOB wellbeing. MOB shared she did not seek treatment and recently disclosed in a [facility] visit that she experienced PP anxiety. MOB reported this time she will reach out to a medical professional for support. MOB reported she is open to seeing a therapist and starting medication, if needed. MOB identified her spouse, dad, friends and cousins as supports that she contact at anytime. MOB expressed that she will make it a conscious effort to do things that make her feel good like shopping at [facility] and getting a manicure. CSW praised MOB for her efforts. CSW provided education regarding the baby blues period vs. perinatal mood disorders, discussed treatment and gave resources for mental health follow up. CSW recommended MOB complete a self-evaluation during the postpartum time period using the New Mom Checklist from Postpartum Progress and encouraged MOB to contact a medical professional if symptoms are noted at any time.  FOB reported that he will call this time, if needed. MOB  denied SI/HI.   CSW provided review of Sudden Infant Death Syndrome (SIDS) precautions. MOB reported she has all essential items for the infant including a crib where the infant will sleep. MOB has chosen [employer] for the infants follow up care. CSW assessed MOB for additional needs. MOB reported no further needs.   CSW identifies no further need for intervention and no barriers to discharge at this time.   Kathrin Greathouse, MSW, LCSW [facility] [place] Worker  [phone] 12/12/2021  12:36 PM

## 2021-12-12 NOTE — Discharge Summary (Signed)
Postpartum Discharge Summary  Date of Service updated 12/12/21     Patient Name: Stacey Houston DOB: July 03, 1986 MRN: [id number]  Date of admission: 12/11/2021 Delivery date:12/11/2021  Delivering provider: Eyvonne Mechanic A  Date of discharge: 12/12/2021  Admitting diagnosis: Pregnancy [Z34.90] Intrauterine pregnancy: [redacted]w[redacted]d     Secondary diagnosis:  Principal Problem:   Pregnancy  Additional problems:     Discharge diagnosis: Term Pregnancy Delivered                                              Post partum procedures: Augmentation: AROM Complications: None  Hospital course: Induction of Labor With Vaginal Delivery   35 y.o. yo G2P2002 at [redacted]w[redacted]d was admitted to the hospital 12/11/2021 for induction of labor.  Indication for induction: Favorable cervix at term.  Patient had an uncomplicated labor course as follows: Membrane Rupture Time/Date: 6:24 AM ,12/11/2021   Delivery Method:Vaginal, Spontaneous  Episiotomy: None  Lacerations:  Perineal;2nd degree  Details of delivery can be found in separate delivery note.  Patient had a routine postpartum course. Patient is discharged home 12/12/21.  Newborn Data: Birth date:12/11/2021  Birth time:9:29 AM  Gender:Female  Living status:Living  Apgars:9 ,9  Weight:3671 g   Magnesium Sulfate received: No BMZ received: No Rhophylac:No MMR:No T-DaP:Given prenatally Flu: No Transfusion:No  Physical exam  Vitals:   12/11/21 1400 12/11/21 1800 12/11/21 2305 12/12/21 0310  BP: (!) 109/57 114/65 113/63 117/64  Pulse: 87 89 79 89  Resp: $Remo'18 18 18 19  'lPNfu$ Temp: 98.3 F (36.8 C) 98.2 F (36.8 C) 98.2 F (36.8 C) 98 F (36.7 C)  TempSrc:   Oral Oral  SpO2: 100% 100% 98% 98%  Weight:      Height:       General: alert, cooperative, and no distress Lochia: appropriate Uterine Fundus: firm Incision: Healing well with no significant drainage DVT Evaluation: No evidence of DVT seen on physical exam. Labs: Lab Results   Component Value Date   WBC 17.3 (H) 12/12/2021   HGB 12.4 12/12/2021   HCT 38.6 12/12/2021   MCV 90.0 12/12/2021   PLT 335 12/12/2021   CMP Latest Ref Rng & Units 05/16/2021  Glucose 70 - 99 mg/dL 88  BUN 6 - 20 mg/dL 7  Creatinine 0.44 - 1.00 mg/dL 0.56  Sodium 135 - 145 mmol/L 135  Potassium 3.5 - 5.1 mmol/L 3.4(L)  Chloride 98 - 111 mmol/L 99  CO2 22 - 32 mmol/L 26  Calcium 8.9 - 10.3 mg/dL 9.1  Total Protein 6.5 - 8.1 g/dL 6.8  Total Bilirubin 0.3 - 1.2 mg/dL 0.7  Alkaline Phos 38 - 126 U/L 58  AST 15 - 41 U/L 17  ALT 0 - 44 U/L 19   Edinburgh Score: Edinburgh Postnatal Depression Scale Screening Tool 12/11/2021  I have been able to laugh and see the funny side of things. 0  I have looked forward with enjoyment to things. 0  I have blamed myself unnecessarily when things went wrong. 1  I have been anxious or worried for no good reason. 1  I have felt scared or panicky for no good reason. 1  Things have been getting on top of me. 1  I have been so unhappy that I have had difficulty sleeping. 0  I have felt sad or miserable. 0  I have been so unhappy  that I have been crying. 0  The thought of harming myself has occurred to me. 0  Edinburgh Postnatal Depression Scale Total 4      After visit meds:  Allergies as of 12/12/2021       Reactions   Amoxicillin Hives   Has patient had a PCN reaction causing immediate rash, facial/tongue/throat swelling, SOB or lightheadedness with hypotension: Yes Has patient had a PCN reaction causing severe rash involving mucus membranes or skin necrosis: Yes Has patient had a PCN reaction that required hospitalization: No Has patient had a PCN reaction occurring within the last 10 years: No If all of the above answers are "NO", then may proceed with Cephalosporin use.   Penicillins Hives   Has patient had a PCN reaction causing immediate rash, facial/tongue/throat swelling, SOB or lightheadedness with hypotension: Yes Has patient had a  PCN reaction causing severe rash involving mucus membranes or skin necrosis: Yes Has patient had a PCN reaction that required hospitalization: No Has patient had a PCN reaction occurring within the last 10 years: No If all of the above answers are "NO", then may proceed with Cephalosporin use.   Sulfa Antibiotics Hives        Medication List     STOP taking these medications    famotidine 10 MG tablet Commonly known as: PEPCID   promethazine 12.5 MG tablet Commonly known as: PHENERGAN       TAKE these medications    acetaminophen 325 MG tablet Commonly known as: Tylenol Take 2 tablets (650 mg total) by mouth every 6 (six) hours as needed (for pain scale < 4).   ibuprofen 600 MG tablet Commonly known as: ADVIL Take 1 tablet (600 mg total) by mouth every 6 (six) hours as needed.   prenatal multivitamin Tabs tablet Take 1 tablet by mouth daily at 12 noon.         Discharge home in stable condition Infant Feeding: Breast Infant Disposition:home with mother Discharge instruction: per After Visit Summary and Postpartum booklet. Activity: Advance as tolerated. Pelvic rest for 6 weeks.  Diet: routine diet Anticipated Birth Control: Unsure Postpartum Appointment:6 weeks Additional Postpartum F/U:  Future Appointments:No future appointments. Follow up Visit:      12/12/2021 Allena Katz, MD

## 2021-12-25 ENCOUNTER — Telehealth (HOSPITAL_COMMUNITY): Payer: Self-pay | Admitting: *Deleted

## 2021-12-25 NOTE — Telephone Encounter (Signed)
Phone voicemail message left to return nurse call.  Odis Hollingshead, RN 12-25-2021 at 11:23am

## 2022-03-20 ENCOUNTER — Ambulatory Visit (INDEPENDENT_AMBULATORY_CARE_PROVIDER_SITE_OTHER): Payer: 59 | Admitting: Dermatology

## 2022-03-20 DIAGNOSIS — L219 Seborrheic dermatitis, unspecified: Secondary | ICD-10-CM

## 2022-03-20 DIAGNOSIS — L988 Other specified disorders of the skin and subcutaneous tissue: Secondary | ICD-10-CM

## 2022-03-20 DIAGNOSIS — I781 Nevus, non-neoplastic: Secondary | ICD-10-CM

## 2022-03-20 MED ORDER — HYDROCORTISONE 2.5 % EX CREA: TOPICAL_CREAM | CUTANEOUS | 11 refills | Status: DC

## 2022-03-20 MED ORDER — TRETINOIN 0.025 % EX CREA: TOPICAL_CREAM | Freq: Every day | CUTANEOUS | 6 refills | Status: AC

## 2022-03-20 MED ORDER — KETOCONAZOLE 2 % EX CREA: TOPICAL_CREAM | CUTANEOUS | 6 refills | Status: DC

## 2022-03-20 NOTE — Progress Notes (Signed)
? ?  Follow-Up Visit ?  ?Subjective  ?Stacey Houston is a 36 y.o. female who presents for the following: Skin Problem  ?Pt would like to try a topical retinol on her face.  ?The patient has spots, moles and lesions to be evaluated, some may be new or changing and the patient has concerns that these could be cancer. ? ?The following portions of the chart were reviewed this encounter and updated as appropriate:  ? Tobacco  Allergies  Meds  Problems  Med Hx  Surg Hx  Fam Hx   ?  ?Review of Systems:  No other skin or systemic complaints except as noted in HPI or Assessment and Plan. ? ?Objective  ?Well appearing patient in no apparent distress; mood and affect are within normal limits. ? ?A focused examination was performed including face,left arm. Relevant physical exam findings are noted in the Assessment and Plan. ? ?face ?Pink patches with greasy scale.  ? ?left upper arm ?Dilated blood vessel ? ?face ?Rhytides and volume loss.  ? ? ?Assessment & Plan  ?Seborrheic dermatitis ?Face; mostly Right Nasolabial area ?Seborrheic Dermatitis  ?-  is a chronic persistent rash characterized by pinkness and scaling most commonly of the mid face but also can occur on the scalp (dandruff), ears; mid chest, mid back and groin.  It tends to be exacerbated by stress and cooler weather.  People who have neurologic disease may experience new onset or exacerbation of existing seborrheic dermatitis.  The condition is not curable but treatable and can be controlled.  ? ?Start Hydrocortisone cream apply to face Mon/Wed/Fri ?Start Ketoconazole cream apply to face Tues/thurs/Sat  ? ?Related Medications ?ketoconazole (NIZORAL) 2 % cream ?Apply to face at bedtime Tue/Thurs/Sat ?hydrocortisone 2.5 % cream ?Apply to face at bedtime Mon/Wed/Fri ? ?Telangiectasia ?left upper arm x 2 ?Telangiectasia ?- Dilated blood vessel ?- Benign appearing on exam ?- Call for changes  ?Discussed the treatment option of BBL/laser.  Typically we  recommend 1-3 treatment sessions about 5-8 weeks apart for best results.  The patient's condition may require "maintenance treatments" in the future.  The fee for BBL / laser treatments is $350 per treatment session for the whole face.  A fee can be quoted for other parts of the body. ?Insurance typically does not pay for BBL/laser treatments and therefore the fee is an out-of-pocket cost.  ? ?Elastosis of skin ?face ?Start Tretinoin .025% cream apply to face at bedtime  ?Topical retinoid medications like tretinoin/Retin-A, adapalene/Differin, tazarotene/Fabior, and Epiduo/Epiduo Forte can cause dryness and irritation when first started. Only apply a pea-sized amount to the entire affected area. Avoid applying it around the eyes, edges of mouth and creases at the nose. If you experience irritation, use a good moisturizer first and/or apply the medicine less often. If you are doing well with the medicine, you can increase how often you use it until you are applying every night. Be careful with sun protection while using this medication as it can make you sensitive to the sun. This medicine should not be used by pregnant women.   ? ?Related Medications ?tretinoin (RETIN-A) 0.025 % cream ?Apply topically at bedtime. ? ?Return in about 1 year (around 03/21/2023) for seb derm . ? ?I, Angelique Holm, CMA, am acting as scribe for Armida Sans, MD .  ?Documentation: I have reviewed the above documentation for accuracy and completeness, and I agree with the above. ? ?Armida Sans, MD ? ?

## 2022-03-20 NOTE — Patient Instructions (Signed)

## 2022-03-21 ENCOUNTER — Encounter: Payer: Self-pay | Admitting: Dermatology

## 2022-04-29 ENCOUNTER — Ambulatory Visit (INDEPENDENT_AMBULATORY_CARE_PROVIDER_SITE_OTHER): Payer: 59 | Admitting: Podiatry

## 2022-04-29 DIAGNOSIS — M79672 Pain in left foot: Secondary | ICD-10-CM

## 2022-04-29 DIAGNOSIS — M79671 Pain in right foot: Secondary | ICD-10-CM | POA: Diagnosis not present

## 2022-04-29 DIAGNOSIS — L84 Corns and callosities: Secondary | ICD-10-CM

## 2022-04-29 NOTE — Patient Instructions (Signed)
Look for urea 40% cream or ointment and apply to the thickened dry skin / calluses. This can be bought over the counter, at a pharmacy or online such as [employer]. ? ? ?Use these on the pads under the 5th toe. Use salicylic acid 17-20% on the deeper corns ?

## 2022-04-29 NOTE — Progress Notes (Signed)
?  Subjective:  ?Patient ID: Stacey Houston, female    DOB: [birth year],  MRN: [id number] ? ?Chief Complaint  ?Patient presents with  ? Callouses  ?    (NP) BIL CALLUSES PAINFUL WHEN APPLYING PRESSURE W/O SHOES ON  ? ? ?36 y.o. female presents with the above complaint. History confirmed with patient.  She has multiple calluses and a few "seed corns".  These developed and got worse after pregnancy. ? ?Objective:  ?Physical Exam: ?warm, good capillary refill, no trophic changes or ulcerative lesions, normal DP and PT pulses, normal sensory exam, and bilateral submetatarsal 1 and plantar hallux diffuse callus, there is a porokeratosis that is acutely painful submetatarsal 2 bilateral. ? ?Assessment:  ? ?1. Callus of foot   ?2. Pain in both feet   ? ? ? ?Plan:  ?Patient was evaluated and treated and all questions answered. ? ?All symptomatic hyperkeratoses were safely debrided with a sterile #15 blade to patient's level of comfort without incident. We discussed preventative and palliative care of these lesions including supportive and accommodative shoegear, padding, prefabricated and custom molded accommodative orthoses, use of a pumice stone and lotions/creams daily. ? ?Recommend to treat at home with salicylic acid and urea cream and offloading pads which we discussed and I dispensed a few of these to her today. ? ?Return if symptoms worsen or fail to improve.  ? ?

## 2022-12-17 ENCOUNTER — Telehealth: Payer: Self-pay

## 2022-12-17 NOTE — Telephone Encounter (Signed)
Yes, of course! Please set her up with me at her convenience.

## 2022-12-17 NOTE — Telephone Encounter (Signed)
Patient schedule

## 2022-12-17 NOTE — Telephone Encounter (Signed)
Pt called asking if Stacey Houston would take her own as a new pt? Pt stated Stacey Houston currently see's her husband & father. Call back # [phone]

## 2022-12-26 ENCOUNTER — Ambulatory Visit (INDEPENDENT_AMBULATORY_CARE_PROVIDER_SITE_OTHER)
Admission: RE | Admit: 2022-12-26 | Discharge: 2022-12-26 | Disposition: A | Discharge status: Home / Self Care | Payer: 59 | Source: Ambulatory Visit | Attending: Primary Care | Admitting: Primary Care

## 2022-12-26 ENCOUNTER — Ambulatory Visit (INDEPENDENT_AMBULATORY_CARE_PROVIDER_SITE_OTHER): Payer: 59 | Admitting: Primary Care

## 2022-12-26 ENCOUNTER — Encounter: Payer: Self-pay | Admitting: Primary Care

## 2022-12-26 VITALS — BP 118/76 | HR 82 | Temp 98.7°F | Ht 64.5 in | Wt 144.0 lb

## 2022-12-26 DIAGNOSIS — R0781 Pleurodynia: Secondary | ICD-10-CM | POA: Insufficient documentation

## 2022-12-26 DIAGNOSIS — F4323 Adjustment disorder with mixed anxiety and depressed mood: Secondary | ICD-10-CM | POA: Diagnosis not present

## 2022-12-26 DIAGNOSIS — R7989 Other specified abnormal findings of blood chemistry: Secondary | ICD-10-CM | POA: Diagnosis not present

## 2022-12-26 DIAGNOSIS — R519 Headache, unspecified: Secondary | ICD-10-CM | POA: Diagnosis not present

## 2022-12-26 HISTORY — DX: Pleurodynia: R07.81

## 2022-12-26 HISTORY — DX: Adjustment disorder with mixed anxiety and depressed mood: F43.23

## 2022-12-26 HISTORY — DX: Other specified abnormal findings of blood chemistry: R79.89

## 2022-12-26 MED ORDER — PROPRANOLOL HCL ER 80 MG PO CP24: 80.0000 mg | ORAL_CAPSULE | Freq: Every day | ORAL | 0 refills | Status: DC

## 2022-12-26 NOTE — Patient Instructions (Addendum)
Stop by the lab and xray prior to leaving today. I will notify you of your results once received.   Start propranolol ER 80 mg for headache prevention. Take this at bedtime. Please update me in a few weeks.  It was a pleasure to meet you today! Please don't hesitate to contact me with any questions. Welcome to [facility]!

## 2022-12-26 NOTE — Assessment & Plan Note (Signed)
Repeat thyroid studies pending today.

## 2022-12-26 NOTE — Assessment & Plan Note (Signed)
Uncontrolled with inappropriate use of NSAID's.  Discussed options for treatment. Start propranolol ER 80 mg HS. She will update in a few weeks.

## 2022-12-26 NOTE — Assessment & Plan Note (Signed)
Overall controlled, will need to closely monitor.  Discussed options for treatment, recommended Zoloft 25 mg.  She will notify if she changes her mind.  Will be starting propranolol ER 80 daily for headache prevention which could help with anxiety symptoms. She will update in a few weeks.

## 2022-12-26 NOTE — Progress Notes (Signed)
Subjective:    Patient ID: Stacey Houston, female    DOB: [birth year], 37 y.o.   MRN: [id number]  HPI  [name] is a very pleasant 37 y.o. female  has a past medical history of Anemia, Anxiety, Chronic pelvic pain in female, Constipation (01/10/2015), Depression, H/O chronic cystitis, History of recurrent UTIs, History of urethral stricture, Nephrolithiasis, Normal labor (11/10/2018), Pregnancy (12/11/2021), and Retained placenta (11/11/2018). who presents today who presents today to establish care and discuss the problems mentioned below. Will obtain/review records.  1) Anxiety and Depression: Prior history of post partum anxiety after the birth of her 24 year old daughter. Symptoms improved until about 1 year ago after the birth of her 60 year old son.   At the time she was treated with Wellbutrin for symptoms of feeling anxious and feeling down, took for about 8 months, but discontinued as she couldn't notice a difference in her anxiety symptoms. She still has residual symptoms of worrying, thinking worst case scenario with her children, fixating on things. Overall she is able to manage her anxiety well.   2) Abnormal Thyroid Levels: She is post partum 1 year. After 6-7 months post partum thyroid levels were checked by OB/GYN at the time which were abnormal. Her thyroid levels have not been checked since.   She does experience chronic fatigue for which she attributes to her two younger children.   She denies a family history of thyroid disease.   2) Frequent Headaches/Migraines: Chronic since teenage years. Headaches occur most everyday and are located to the frontal lobes bilaterally without radiation. This began about 4 years ago. She does notice photophobia and nausea intermittently. She will take Ibuprofen 400 mg daily, most everyday.   She has a history of optic migraines. She denies recent migraines. She was previously treated with Naproxen. She underwent CT scan years ago  which was abnormal.   3) Acute Rib Pain: Acute for the last 6-8 weeks. Treated with Doxycycline for a CAP just prior to Christmas and symptom onset. On Christmas day she coughed and noticed a pop with severe pain to the right lateral lower rib. She was already experiencing pain to the left upper lateral and right lower lateral rib from coughing. She's noticed gradual improvement in her pain which is intermittent when she coughs deeply or sneezes. She denies pain on deep inspiration.   Review of Systems  Eyes:  Positive for photophobia.  Neurological:  Positive for headaches.  Psychiatric/Behavioral:  The patient is nervous/anxious.          Past Medical History:  Diagnosis Date   Anemia    Anxiety    Postpartum   Chronic pelvic pain in female    Constipation 01/10/2015   Depression    Postpartum   H/O chronic cystitis    History of recurrent UTIs    History of urethral stricture    Nephrolithiasis    bilateral non-obstructive per urologist note 06-16-2017  -- dr Louis Meckel   Normal labor 11/10/2018   Pregnancy 12/11/2021   Retained placenta 11/11/2018    Social History   Socioeconomic History   Marital status: Married    Spouse name: Not on file   Number of children: Not on file   Years of education: Not on file   Highest education level: Not on file  Occupational History   Not on file  Tobacco Use   Smoking status: Never   Smokeless tobacco: Never  Vaping Use   Vaping Use:  Never used  Substance and Sexual Activity   Alcohol use: No    Alcohol/week: 0.0 standard drinks of alcohol   Drug use: No   Sexual activity: Yes    Birth control/protection: None  Other Topics Concern   Not on file  Social History Narrative   Not on file   Social Determinants of Health   Financial Resource Strain: Not on file  Food Insecurity: Not on file  Transportation Needs: Not on file  Physical Activity: Not on file  Stress: Not on file  Social Connections: Not on file  Intimate  Partner Violence: Not on file    Past Surgical History:  Procedure Laterality Date   AUGMENTATION MAMMAPLASTY Bilateral 2009   CHROMOPERTUBATION N/A 08/12/2017   Procedure: CHROMOPERTUBATION;  Surgeon: Mitchel Honour, DO;  Location: [facility];  Service: Gynecology;  Laterality: N/A;   CYSTO/  URETHRAL DILATION  11/09/2004   DILATION AND EVACUATION N/A 11/11/2018   Procedure: DILATATION AND EVACUATION;  Surgeon: Zelphia Cairo, MD;  Location: [facility];  Service: Gynecology;  Laterality: N/A;   LAPAROSCOPY N/A 08/12/2017   Procedure: LAPAROSCOPY DIAGNOSTIC [place] OF ENDOMETRIOSI;  Surgeon: Mitchel Honour, DO;  Location: [facility];  Service: Gynecology;  Laterality: N/A;   WISDOM TOOTH EXTRACTION  03/2016    Family History  Problem Relation Age of Onset   Diabetes Maternal Grandmother    Diabetes Maternal Grandfather    Cancer Paternal Grandmother    Diabetes Paternal Grandmother    Cancer Paternal Grandfather    Diabetes Paternal Grandfather     Allergies  Allergen Reactions   Amoxicillin Hives    Has patient had a PCN reaction causing immediate rash, facial/tongue/throat swelling, SOB or lightheadedness with hypotension: Yes Has patient had a PCN reaction causing severe rash involving mucus membranes or skin necrosis: Yes Has patient had a PCN reaction that required hospitalization: No Has patient had a PCN reaction occurring within the last 10 years: No If all of the above answers are "NO", then may proceed with Cephalosporin use.   Penicillins Hives    Has patient had a PCN reaction causing immediate rash, facial/tongue/throat swelling, SOB or lightheadedness with hypotension: Yes Has patient had a PCN reaction causing severe rash involving mucus membranes or skin necrosis: Yes Has patient had a PCN reaction that required hospitalization: No Has patient had a PCN reaction occurring within the last 10 years: No If all of the above  answers are "NO", then may proceed with Cephalosporin use.    Sulfa Antibiotics Hives    Current Outpatient Medications on File Prior to Visit  Medication Sig Dispense Refill   ibuprofen (ADVIL) 600 MG tablet Take 1 tablet (600 mg total) by mouth every 6 (six) hours as needed. 60 tablet 0   NIKKI 3-0.02 MG tablet Take 1 tablet by mouth daily.     Prenatal Vit-Fe Fumarate-FA (PRENATAL MULTIVITAMIN) TABS tablet Take 1 tablet by mouth daily at 12 noon. 60 tablet 1   tretinoin (RETIN-A) 0.025 % cream Apply topically at bedtime. 45 g 6   acetaminophen (TYLENOL) 325 MG tablet Take 2 tablets (650 mg total) by mouth every 6 (six) hours as needed (for pain scale < 4). (Patient not taking: Reported on 12/26/2022) 60 tablet 0   hydrocortisone 2.5 % cream Apply to face at bedtime Mon/Wed/Fri (Patient not taking: Reported on 12/26/2022) 30 g 11   ketoconazole (NIZORAL) 2 % cream Apply to face at bedtime Tue/Thurs/Sat (Patient not taking: Reported on  12/26/2022) 60 g 6   No current facility-administered medications on file prior to visit.    BP 118/76   Pulse 82   Temp 98.7 F (37.1 C) (Temporal)   Ht 5' 4.5" (1.638 m)   Wt 144 lb (65.3 kg)   SpO2 98%   BMI 24.34 kg/m  Objective:   Physical Exam Neck:     Thyroid: No thyroid mass, thyromegaly or thyroid tenderness.  Cardiovascular:     Rate and Rhythm: Normal rate and regular rhythm.  Pulmonary:     Effort: Pulmonary effort is normal.     Breath sounds: Normal breath sounds.  Musculoskeletal:     Cervical back: Neck supple.  Skin:    General: Skin is warm and dry.  Neurological:     Mental Status: She is alert and oriented to person, place, and time.  Psychiatric:        Mood and Affect: Mood normal.           Assessment & Plan:  Adjustment reaction with anxiety and depression Assessment & Plan: Overall controlled, will need to closely monitor.  Discussed options for treatment, recommended Zoloft 25 mg.  She will notify if she  changes her mind.  Will be starting propranolol ER 80 daily for headache prevention which could help with anxiety symptoms. She will update in a few weeks.   Frequent headaches Assessment & Plan: Uncontrolled with inappropriate use of NSAID's.  Discussed options for treatment. Start propranolol ER 80 mg HS. She will update in a few weeks.  Orders: -     Propranolol HCl ER; Take 1 capsule (80 mg total) by mouth at bedtime. For headache prevention  Dispense: 90 capsule; Refill: 0  Abnormal thyroid blood test Assessment & Plan: Repeat thyroid studies pending today.   Orders: -     TSH -     T4, free -     Thyroid peroxidase antibody  Rib pain Assessment & Plan: Right > left.  Likely secondary to pleurisy from prior respiratory infection. Checking plain films today.    Orders: -     DG Ribs Unilateral Right        Pleas Koch, NP

## 2022-12-26 NOTE — Assessment & Plan Note (Signed)
Right > left.  Likely secondary to pleurisy from prior respiratory infection. Checking plain films today.

## 2022-12-27 LAB — THYROID PEROXIDASE ANTIBODY: Thyroperoxidase Ab SerPl-aCnc: <1 IU/mL (ref <9)

## 2022-12-30 LAB — TSH: TSH: 1.24 u[IU]/mL (ref 0.35–5.50)

## 2022-12-30 LAB — T4, FREE: Free T4: 0.84 ng/dL (ref 0.60–1.60)

## 2023-01-03 DIAGNOSIS — S2231XS Fracture of one rib, right side, sequela: Secondary | ICD-10-CM

## 2023-01-27 ENCOUNTER — Encounter: Payer: Self-pay | Admitting: *Deleted

## 2023-02-05 NOTE — Telephone Encounter (Signed)
Vida Roller, can you please print this order and have Anda Kraft sign it and please fax back?   Sorry for the delay, I was out of the office on Friday 01/31/2023 when the patient sent this and I am just now seeing her message.

## 2023-02-05 NOTE — Telephone Encounter (Signed)
Faxed a copy of signed order to fax number provided.

## 2023-02-10 ENCOUNTER — Telehealth: Payer: Self-pay | Admitting: Primary Care

## 2023-02-10 NOTE — Telephone Encounter (Signed)
[facility] called requesting the xray records from the pt's most recent xray? [facility] requested records be faxed to [phone]. Call back # [phone]

## 2023-02-11 NOTE — Telephone Encounter (Signed)
I faxed patient's xray report to [facility] imaging at fax#[phone].  Received fax confirmation.  I called [facility] at [phone] to verify that they do not need a CD of the images.  They do not, they only need the report.

## 2023-02-11 NOTE — Telephone Encounter (Signed)
[facility] called checking on status of Xray report being faxed over to them. [facility] stated pt has a MRI scheduled for 02/17/23 & needs reports before then. Call back # [phone]

## 2023-02-19 ENCOUNTER — Ambulatory Visit: Payer: 59 | Admitting: Podiatry

## 2023-02-19 ENCOUNTER — Encounter: Payer: Self-pay | Admitting: Primary Care

## 2023-02-19 DIAGNOSIS — B351 Tinea unguium: Secondary | ICD-10-CM | POA: Diagnosis not present

## 2023-02-19 DIAGNOSIS — L600 Ingrowing nail: Secondary | ICD-10-CM

## 2023-02-19 MED ORDER — EFINACONAZOLE 10 % EX SOLN: 1.0000 [drp] | Freq: Every day | CUTANEOUS | 11 refills | Status: DC

## 2023-02-19 MED ORDER — NEOMYCIN-POLYMYXIN-HC 3.5-10000-1 OT SUSP: OTIC | 0 refills | Status: DC

## 2023-02-19 NOTE — Progress Notes (Signed)
  Subjective:  Patient ID: Stacey Houston, female    DOB: 03-26-1986,  MRN: [id number]  Chief Complaint  Patient presents with   Ingrown Toenail    R great toe in grown with blood.    37 y.o. female presents with the above complaint. History confirmed with patient.  She returns for follow-up with a new issue of an ingrown nail on the right border of the hallux.  She also notes that both great toes are starting to show yellow discoloration  Objective:  Physical Exam: warm, good capillary refill, no trophic changes or ulcerative lesions, normal DP and PT pulses, normal sensory exam, and ingrown right hallux medial border, granuloma proximal.  Yellow discoloration distal nail fold both hallux nails  Assessment:   1. Ingrowing right great toenail   2. Onychomycosis      Plan:  Patient was evaluated and treated and all questions answered.  We discussed that the discoloration is likely early onychomycosis.  We discussed topical oral and laser treatment I do not think she would require oral treatment for relatively superficial early onychomycosis.  I recommended topical treatment with Jublia.  Rx was sent to specialty pharmacy we reviewed its use.  She will return to see me if this does not improve or worsens.  Ingrown Nail, right -Patient elects to proceed with minor surgery to remove ingrown toenail today. Consent reviewed and signed by patient. -Ingrown nail excised. See procedure note. -Educated on post-procedure care including soaking. Written instructions provided and reviewed. -Rx for Cortisporin sent to pharmacy. -Advised on signs and symptoms of infection developing.  We discussed that the phenol likely will create some redness and edema and tenderness around the nailbed as long as it is localized this is to be expected.  Will return as needed if any infection signs develop -At the end of the procedure the patient experienced a vasovagal response, she did not lose consciousness,  she was elevated into the Trendelenburg position given water and crackers and she was able to recover.  Her blood pressure was 108/57, she says this is the normal range for her.  She was stable for discharge to home safely  Procedure: Excision of Ingrown Toenail Location: Right 1st toe medial nail borders. Anesthesia: Lidocaine 1% plain; 1.5 mL and Marcaine 0.5% plain; 1.5 mL, digital block. Skin Prep: Betadine. Dressing: Silvadene; telfa; dry, sterile, compression dressing. Technique: Following skin prep, the toe was exsanguinated and a tourniquet was secured at the base of the toe. The affected nail border was freed, split with a nail splitter, and excised. Chemical matrixectomy was then performed with phenol and irrigated out with alcohol. The tourniquet was then removed and sterile dressing applied. Disposition: Patient tolerated procedure well.    No follow-ups on file.

## 2023-02-20 ENCOUNTER — Encounter: Payer: Self-pay | Admitting: Podiatry

## 2023-03-23 ENCOUNTER — Other Ambulatory Visit: Payer: Self-pay | Admitting: Primary Care

## 2023-03-23 DIAGNOSIS — R519 Headache, unspecified: Secondary | ICD-10-CM

## 2023-03-23 NOTE — Telephone Encounter (Signed)
Please call patient:  How is she doing on the propranolol ER 80 mg capsules daily for headache prevention? Does she want to continue?

## 2023-03-24 MED ORDER — TOPIRAMATE 50 MG PO TABS: 50.0000 mg | ORAL_TABLET | Freq: Every day | ORAL | 0 refills | Status: DC

## 2023-03-24 NOTE — Telephone Encounter (Signed)
Called and spoke with patient she would like to give the Topamax a try. Advised you would send to pharmacy

## 2023-03-24 NOTE — Telephone Encounter (Signed)
The propranolol is perfectly safe and would also help with anxiety.  We could try Topamax at bedtime.  Have her notify us if she is interested.

## 2023-03-24 NOTE — Telephone Encounter (Signed)
Called and spoke with patient she states she never started taking the medication because after she did some of her own research on it she was concerned that it was a beta blocker. She is requesting if there is another medication that she can try for headache prevention that is not a beta blocker.

## 2023-03-24 NOTE — Telephone Encounter (Signed)
Noted, Rx for Topamax sent to pharmacy

## 2023-03-26 ENCOUNTER — Ambulatory Visit: Payer: 59 | Admitting: Dermatology

## 2023-03-26 VITALS — BP 126/76 | HR 82

## 2023-03-26 DIAGNOSIS — L821 Other seborrheic keratosis: Secondary | ICD-10-CM

## 2023-03-26 DIAGNOSIS — W57XXXA Bitten or stung by nonvenomous insect and other nonvenomous arthropods, initial encounter: Secondary | ICD-10-CM | POA: Diagnosis not present

## 2023-03-26 DIAGNOSIS — L309 Dermatitis, unspecified: Secondary | ICD-10-CM | POA: Diagnosis not present

## 2023-03-26 DIAGNOSIS — Z79899 Other long term (current) drug therapy: Secondary | ICD-10-CM | POA: Diagnosis not present

## 2023-03-26 MED ORDER — CLOBETASOL PROPIONATE 0.05 % EX CREA: TOPICAL_CREAM | CUTANEOUS | 0 refills | Status: DC

## 2023-03-26 NOTE — Progress Notes (Signed)
   Follow-Up Visit   Subjective  Stacey Houston is a 37 y.o. female who presents for the following: patient c/o itchy bites on her arms that appeared on her arms 3 days ago after she had been out in the yard.  The patient has spots, moles and lesions to be evaluated, some may be new or changing and the patient has concerns that these could be cancer.  The following portions of the chart were reviewed this encounter and updated as appropriate: medications, allergies, medical history  Review of Systems:  No other skin or systemic complaints except as noted in HPI or Assessment and Plan.  Objective  Well appearing patient in no apparent distress; mood and affect are within normal limits. A focused examination was performed of the following areas:face,arms, left hip Relevant exam findings are noted in the Assessment and Plan.   Assessment & Plan   BITES/CONTACT DERMATITIS  Exam:  pink papules and bite appearing lesions  Location: arms   Treatment Plan: Start  Clobetasol cream apply twice daily as needed to affected areas for two weeks then stop, Avoid applying to face, groin, and axilla. Use as directed. Long-term use can cause thinning of the skin.   Topical steroids (such as triamcinolone, fluocinolone, fluocinonide, mometasone, clobetasol, halobetasol, betamethasone, hydrocortisone) can cause thinning and lightening of the skin if they are used for too long in the same area. Your physician has selected the right strength medicine for your problem and area affected on the body. Please use your medication only as directed by your physician to prevent side effects.    SEBORRHEIC KERATOSIS Left hip - Stuck-on, waxy, tan-brown papules and/or plaques  - Benign-appearing - Discussed benign etiology and prognosis. - Observe - Call for any changes  Return if symptoms worsen or fail to improve.  IAngelique Holm, CMA, am acting as scribe for Armida Sans, MD .   Documentation: I  have reviewed the above documentation for accuracy and completeness, and I agree with the above.  Armida Sans, MD

## 2023-03-26 NOTE — Patient Instructions (Addendum)
Seborrheic Keratosis  What causes seborrheic keratoses? Seborrheic keratoses are harmless, common skin growths that first appear during adult life.  As time goes by, more growths appear.  Some people may develop a large number of them.  Seborrheic keratoses appear on both covered and uncovered body parts.  They are not caused by sunlight.  The tendency to develop seborrheic keratoses can be inherited.  They vary in color from skin-colored to gray, brown, or even black.  They can be either smooth or have a rough, warty surface.   Seborrheic keratoses are superficial and look as if they were stuck on the skin.  Under the microscope this type of keratosis looks like layers upon layers of skin.  That is why at times the top layer may seem to fall off, but the rest of the growth remains and re-grows.    Treatment Seborrheic keratoses do not need to be treated, but can easily be removed in the office.  Seborrheic keratoses often cause symptoms when they rub on clothing or jewelry.  Lesions can be in the way of shaving.  If they become inflamed, they can cause itching, soreness, or burning.  Removal of a seborrheic keratosis can be accomplished by freezing, burning, or surgery. If any spot bleeds, scabs, or grows rapidly, please return to have it checked, as these can be an indication of a skin cancer.  Due to recent changes in healthcare laws, you may see results of your pathology and/or laboratory studies on MyChart before the doctors have had a chance to review them. We understand that in some cases there may be results that are confusing or concerning to you. Please understand that not all results are received at the same time and often the doctors may need to interpret multiple results in order to provide you with the best plan of care or course of treatment. Therefore, we ask that you please give us 2 business days to thoroughly review all your results before contacting the office for clarification. Should  we see a critical lab result, you will be contacted sooner.   If You Need Anything After Your Visit  If you have any questions or concerns for your doctor, please call our main line at [phone] and press option 4 to reach your doctor's medical assistant. If no one answers, please leave a voicemail as directed and we will return your call as soon as possible. Messages left after 4 pm will be answered the following business day.   You may also send us a message via MyChart. We typically respond to MyChart messages within 1-2 business days.  For prescription refills, please ask your pharmacy to contact our office. Our fax number is [phone].  If you have an urgent issue when the clinic is closed that cannot wait until the next business day, you can page your doctor at the number below.    Please note that while we do our best to be available for urgent issues outside of office hours, we are not available 24/7.   If you have an urgent issue and are unable to reach us, you may choose to seek medical care at your doctor's office, retail clinic, urgent care center, or emergency room.  If you have a medical emergency, please immediately call 911 or go to the emergency department.  Pager Numbers  - Dr. Kowalski: [phone]  - Dr. Moye: [phone]  - Dr. Stewart: [phone]  In the event of inclement weather, please call our main line at   [phone] for an update on the status of any delays or closures.  Dermatology Medication Tips: Please keep the boxes that topical medications come in in order to help keep track of the instructions about where and how to use these. Pharmacies typically print the medication instructions only on the boxes and not directly on the medication tubes.   If your medication is too expensive, please contact our office at [phone] option 4 or send us a message through MyChart.   We are unable to tell what your co-pay for medications will be in  advance as this is different depending on your insurance coverage. However, we may be able to find a substitute medication at lower cost or fill out paperwork to get insurance to cover a needed medication.   If a prior authorization is required to get your medication covered by your insurance company, please allow us 1-2 business days to complete this process.  Drug prices often vary depending on where the prescription is filled and some pharmacies may offer cheaper prices.  The website www.goodrx.com contains coupons for medications through different pharmacies. The prices here do not account for what the cost may be with help from insurance (it may be cheaper with your insurance), but the website can give you the price if you did not use any insurance.  - You can print the associated coupon and take it with your prescription to the pharmacy.  - You may also stop by our office during regular business hours and pick up a GoodRx coupon card.  - If you need your prescription sent electronically to a different pharmacy, notify our office through [facility] MyChart or by phone at [phone] option 4.     Si Usted Necesita Algo Despus de Su Visita  Tambin puede enviarnos un mensaje a travs de MyChart. Por lo general respondemos a los mensajes de MyChart en el transcurso de 1 a 2 das hbiles.  Para renovar recetas, por favor pida a su farmacia que se ponga en contacto con nuestra oficina. Nuestro nmero de fax es el [phone].  Si tiene un asunto urgente cuando la clnica est cerrada y que no puede esperar hasta el siguiente da hbil, puede llamar/localizar a su doctor(a) al nmero que aparece a continuacin.   Por favor, tenga en cuenta que aunque hacemos todo lo posible para estar disponibles para asuntos urgentes fuera del horario de oficina, no estamos disponibles las 24 horas del da, los 7 das de la semana.   Si tiene un problema urgente y no puede comunicarse con nosotros, puede  optar por buscar atencin mdica  en el consultorio de su doctor(a), en una clnica privada, en un centro de atencin urgente o en una sala de emergencias.  Si tiene una emergencia mdica, por favor llame inmediatamente al 911 o vaya a la sala de emergencias.  Nmeros de bper  - Dr. Kowalski: [phone]  - Dra. Moye: [phone]  - Dra. Stewart: [phone]  En caso de inclemencias del tiempo, por favor llame a nuestra lnea principal al [phone] para una actualizacin sobre el estado de cualquier retraso o cierre.  Consejos para la medicacin en dermatologa: Por favor, guarde las cajas en las que vienen los medicamentos de uso tpico para ayudarle a seguir las instrucciones sobre dnde y cmo usarlos. Las farmacias generalmente imprimen las instrucciones del medicamento slo en las cajas y no directamente en los tubos del medicamento.   Si su medicamento es muy caro, por favor, pngase en contacto con   nuestra oficina llamando al [phone] y presione la opcin 4 o envenos un mensaje a travs de MyChart.   No podemos decirle cul ser su copago por los medicamentos por adelantado ya que esto es diferente dependiendo de la cobertura de su seguro. Sin embargo, es posible que podamos encontrar un medicamento sustituto a menor costo o llenar un formulario para que el seguro cubra el medicamento que se considera necesario.   Si se requiere una autorizacin previa para que su compaa de seguros cubra su medicamento, por favor permtanos de 1 a 2 das hbiles para completar este proceso.  Los precios de los medicamentos varan con frecuencia dependiendo del lugar de dnde se surte la receta y alguna farmacias pueden ofrecer precios ms baratos.  El sitio web www.goodrx.com tiene cupones para medicamentos de diferentes farmacias. Los precios aqu no tienen en cuenta lo que podra costar con la ayuda del seguro (puede ser ms barato con su seguro), pero el sitio web puede darle el  precio si no utiliz ningn seguro.  - Puede imprimir el cupn correspondiente y llevarlo con su receta a la farmacia.  - Tambin puede pasar por nuestra oficina durante el horario de atencin regular y recoger una tarjeta de cupones de GoodRx.  - Si necesita que su receta se enve electrnicamente a una farmacia diferente, informe a nuestra oficina a travs de MyChart de [facility] o por telfono llamando al [phone] y presione la opcin 4.  

## 2023-04-11 ENCOUNTER — Encounter: Payer: Self-pay | Admitting: Dermatology

## 2023-04-11 DIAGNOSIS — R519 Headache, unspecified: Secondary | ICD-10-CM

## 2023-04-12 MED ORDER — AMITRIPTYLINE HCL 10 MG PO TABS: 10.0000 mg | ORAL_TABLET | Freq: Every day | ORAL | 0 refills | Status: DC

## 2023-04-12 NOTE — Telephone Encounter (Signed)
Please see the MyChart message reply(ies) for my assessment and plan.  The patient gave consent for this Medical Advice Message and is aware that it may result in a bill to their insurance company as well as the possibility that this may result in a co-payment or deductible. They are an established patient, but are not seeking medical advice exclusively about a problem treated during an in person or video visit in the last 7 days. I did not recommend an in person or video visit within 7 days of my reply.  I spent a total of 10 minutes cumulative time within 7 days through MyChart messaging Ojani Berenson K Marvia Troost, NP  

## 2023-04-15 NOTE — Telephone Encounter (Signed)
Called patient and scheduled appt for 04/16/23 @3 :20pm.

## 2023-04-15 NOTE — Telephone Encounter (Signed)
Please have patient scheduled with me to discuss further.

## 2023-04-16 ENCOUNTER — Encounter: Payer: Self-pay | Admitting: Primary Care

## 2023-04-16 ENCOUNTER — Ambulatory Visit (INDEPENDENT_AMBULATORY_CARE_PROVIDER_SITE_OTHER): Payer: No Typology Code available for payment source | Admitting: Primary Care

## 2023-04-16 VITALS — BP 108/62 | HR 85 | Temp 98.3°F | Ht 64.5 in | Wt 143.0 lb

## 2023-04-16 DIAGNOSIS — R519 Headache, unspecified: Secondary | ICD-10-CM | POA: Diagnosis not present

## 2023-04-16 DIAGNOSIS — F4323 Adjustment disorder with mixed anxiety and depressed mood: Secondary | ICD-10-CM

## 2023-04-16 NOTE — Progress Notes (Signed)
Subjective:    Patient ID: Stacey Houston, female    DOB: 18-Nov-1986, 37 y.o.   MRN: [id number]  Depression        Associated symptoms include headaches.  Associated symptoms include no suicidal ideas.   Treva Huyett Hartsfield is a very pleasant 37 y.o. female with a history of frequent headaches, anxiety and depression who presents today to discus anxiety and depression.  Initiated on Topamax 50 mg HS for headache prevention in early April 2024. She began the medication on 03/24/23. Last week she notified us via MyChart that she developed symptoms of feeling down and hopeless for which she thought was secondary to Topamax so she discontinued last week.   She contacted [place] this week via MyChart with reports of continued depression symptoms so she was asked to come in today.   Symptoms originally began about 4 years ago after the birth of her daughter. Three years ago she was prescribed Wellbutrin by her OB/GYN for which she took for 8 months and didn't notice a difference. Since then she has experienced mild symptoms of feeling overwhelmed, feeling anxious, feeling down, obsessing to complete certain tasks before she can move on to another.   Her anxiety and obsessive symptoms increased about 3-4 weeks ago when she found an insect on her bed skirt fearing bedbugs. Since then she's constantly worrying, feeling hopeless, feeling down, "even my kids don't make me happy". Friends and family have noticed as well.   She did very well on Topamax for headache prevention. Since discontinuation she's noticed a return in her headaches and is now having to take Ibuprofen.    Review of Systems  Skin:  Negative for color change.  Neurological:  Positive for headaches.  Psychiatric/Behavioral:  Positive for depression and sleep disturbance. Negative for suicidal ideas. The patient is nervous/anxious.        See HPI         Past Medical History:  Diagnosis Date   Anemia    Anxiety    Postpartum    Chronic pelvic pain in female    Constipation 01/10/2015   Depression    Postpartum   H/O chronic cystitis    History of recurrent UTIs    History of urethral stricture    Nephrolithiasis    bilateral non-obstructive per urologist note 06-16-2017  -- dr Marlou Porch   Normal labor 11/10/2018   Pregnancy 12/11/2021   Retained placenta 11/11/2018    Social History   Socioeconomic History   Marital status: Married    Spouse name: Not on file   Number of children: Not on file   Years of education: Not on file   Highest education level: Not on file  Occupational History   Not on file  Tobacco Use   Smoking status: Never   Smokeless tobacco: Never  Vaping Use   Vaping Use: Never used  Substance and Sexual Activity   Alcohol use: No    Alcohol/week: 0.0 standard drinks of alcohol   Drug use: No   Sexual activity: Yes    Birth control/protection: None  Other Topics Concern   Not on file  Social History Narrative   Not on file   Social Determinants of Health   Financial Resource Strain: Not on file  Food Insecurity: Not on file  Transportation Needs: Not on file  Physical Activity: Not on file  Stress: Not on file  Social Connections: Not on file  Intimate Partner Violence: Not on file  Past Surgical History:  Procedure Laterality Date   AUGMENTATION MAMMAPLASTY Bilateral 2009   CHROMOPERTUBATION N/A 08/12/2017   Procedure: CHROMOPERTUBATION;  Surgeon: Mitchel Honour, DO;  Location: [facility];  Service: Gynecology;  Laterality: N/A;   CYSTO/  URETHRAL DILATION  11/09/2004   DILATION AND EVACUATION N/A 11/11/2018   Procedure: DILATATION AND EVACUATION;  Surgeon: Zelphia Cairo, MD;  Location: [facility];  Service: Gynecology;  Laterality: N/A;   LAPAROSCOPY N/A 08/12/2017   Procedure: LAPAROSCOPY DIAGNOSTIC [place] OF ENDOMETRIOSI;  Surgeon: Mitchel Honour, DO;  Location: [facility];  Service: Gynecology;  Laterality: N/A;    WISDOM TOOTH EXTRACTION  03/2016    Family History  Problem Relation Age of Onset   Diabetes Maternal Grandmother    Diabetes Maternal Grandfather    Cancer Paternal Grandmother    Diabetes Paternal Grandmother    Cancer Paternal Grandfather    Diabetes Paternal Grandfather     Allergies  Allergen Reactions   Amoxicillin Hives    Has patient had a PCN reaction causing immediate rash, facial/tongue/throat swelling, SOB or lightheadedness with hypotension: Yes Has patient had a PCN reaction causing severe rash involving mucus membranes or skin necrosis: Yes Has patient had a PCN reaction that required hospitalization: No Has patient had a PCN reaction occurring within the last 10 years: No If all of the above answers are "NO", then may proceed with Cephalosporin use.   Penicillins Hives    Has patient had a PCN reaction causing immediate rash, facial/tongue/throat swelling, SOB or lightheadedness with hypotension: Yes Has patient had a PCN reaction causing severe rash involving mucus membranes or skin necrosis: Yes Has patient had a PCN reaction that required hospitalization: No Has patient had a PCN reaction occurring within the last 10 years: No If all of the above answers are "NO", then may proceed with Cephalosporin use.    Sulfa Antibiotics Hives    Current Outpatient Medications on File Prior to Visit  Medication Sig Dispense Refill   ibuprofen (ADVIL) 600 MG tablet Take 1 tablet (600 mg total) by mouth every 6 (six) hours as needed. 60 tablet 0   NIKKI 3-0.02 MG tablet Take 1 tablet by mouth daily.     Prenatal Vit-Fe Fumarate-FA (PRENATAL MULTIVITAMIN) TABS tablet Take 1 tablet by mouth daily at 12 noon. 60 tablet 1   topiramate (TOPAMAX) 50 MG tablet Take 50 mg by mouth at bedtime. For headache prevention     acetaminophen (TYLENOL) 325 MG tablet Take 2 tablets (650 mg total) by mouth every 6 (six) hours as needed (for pain scale < 4). (Patient not taking: Reported on  12/26/2022) 60 tablet 0   clobetasol cream (TEMOVATE) 0.05 % apply twice daily as needed to affected areas for two weeks then stop, Avoid applying to face, groin, and axilla. Use as directed. Long-term use can cause thinning of the skin. (Patient not taking: Reported on 04/16/2023) 30 g 0   Efinaconazole 10 % SOLN Apply 1 drop topically daily. (Patient not taking: Reported on 04/16/2023) 4 mL 11   hydrocortisone 2.5 % cream Apply to face at bedtime Mon/Wed/Fri (Patient not taking: Reported on 12/26/2022) 30 g 11   ketoconazole (NIZORAL) 2 % cream Apply to face at bedtime Tue/Thurs/Sat (Patient not taking: Reported on 12/26/2022) 60 g 6   neomycin-polymyxin-hydrocortisone (CORTISPORIN) 3.5-10000-1 OTIC suspension Apply 1-2 drops daily after soaking and cover with bandaid (Patient not taking: Reported on 04/16/2023) 10 mL 0   No current facility-administered medications  on file prior to visit.    BP 108/62   Pulse 85   Temp 98.3 F (36.8 C) (Temporal)   Ht 5' 4.5" (1.638 m)   Wt 143 lb (64.9 kg)   LMP 04/14/2023 (Exact Date)   SpO2 99%   Breastfeeding No   BMI 24.17 kg/m  Objective:   Physical Exam Cardiovascular:     Rate and Rhythm: Normal rate and regular rhythm.  Pulmonary:     Effort: Pulmonary effort is normal.     Breath sounds: Normal breath sounds.  Musculoskeletal:     Cervical back: Neck supple.  Skin:    General: Skin is warm and dry.  Psychiatric:        Mood and Affect: Mood normal.           Assessment & Plan:  Adjustment reaction with anxiety and depression Assessment & Plan: Deteriorated.  It's possible that she may have undiagnosed OCD.  Discussed options for treatment. Referral placed for psychiatry. Referral placed for psychology.   Will resume Topamax as this was unlikely the cause for her symptoms.    Orders: -     Ambulatory referral to Psychiatry -     Ambulatory referral to Psychology  Frequent headaches Assessment & Plan: Improved with  Topamax!  Resume Topamax 50 mg HS. She will update if her depression symptoms worsen.         Doreene Nest, NP

## 2023-04-16 NOTE — Assessment & Plan Note (Signed)
Deteriorated.  It's possible that she may have undiagnosed OCD.  Discussed options for treatment. Referral placed for psychiatry. Referral placed for psychology.   Will resume Topamax as this was unlikely the cause for her symptoms.

## 2023-04-16 NOTE — Assessment & Plan Note (Signed)
Improved with Topamax!  Resume Topamax 50 mg HS. She will update if her depression symptoms worsen.

## 2023-04-16 NOTE — Patient Instructions (Signed)
You will either be contacted via phone regarding your referral to psychiatry and therapy, or you may receive a letter on your MyChart portal from our referral team with instructions for scheduling an appointment. Please let us know if you have not been contacted by anyone within two weeks.  Resume your Topamax.  It was a pleasure to see you today!

## 2023-06-03 ENCOUNTER — Ambulatory Visit (INDEPENDENT_AMBULATORY_CARE_PROVIDER_SITE_OTHER): Payer: No Typology Code available for payment source | Admitting: Psychiatry

## 2023-06-03 ENCOUNTER — Encounter (HOSPITAL_COMMUNITY): Payer: Self-pay | Admitting: Psychiatry

## 2023-06-03 ENCOUNTER — Other Ambulatory Visit: Payer: Self-pay | Admitting: Primary Care

## 2023-06-03 DIAGNOSIS — F330 Major depressive disorder, recurrent, mild: Secondary | ICD-10-CM | POA: Insufficient documentation

## 2023-06-03 DIAGNOSIS — F429 Obsessive-compulsive disorder, unspecified: Secondary | ICD-10-CM | POA: Insufficient documentation

## 2023-06-03 DIAGNOSIS — F422 Mixed obsessional thoughts and acts: Secondary | ICD-10-CM | POA: Diagnosis not present

## 2023-06-03 DIAGNOSIS — R519 Headache, unspecified: Secondary | ICD-10-CM | POA: Diagnosis not present

## 2023-06-03 DIAGNOSIS — F411 Generalized anxiety disorder: Secondary | ICD-10-CM

## 2023-06-03 MED ORDER — FLUOXETINE HCL 10 MG PO CAPS
10.0000 mg | ORAL_CAPSULE | Freq: Every day | ORAL | 1 refills | Status: DC
Start: 2023-06-03 — End: 2023-06-27

## 2023-06-03 NOTE — Progress Notes (Signed)
Psychiatric Initial Adult Assessment  Patient Identification: Stacey Houston MRN:  [id number] Date of Evaluation:  06/03/2023 Referral Source: PCP  Assessment:  Stacey Houston is a 37 y.o. female with a history of OCD, generalized anxiety disorder, postpartum depression with worsening of OCD, recurrent major depressive disorder, chronic headaches who presents to [facility] via video conferencing for initial evaluation of OCD.  Patient reported parents divorced at age 80 and with her mother leaving she does not have much of a relationship with her noting ongoing alcoholism.  Her sister also died around the same time as parents divorce and she saw psychotherapy briefly for this.  She denies a history of trauma but with circumstances of the above suspect this could have possible origin point for psychologic etiology of OCD.  Frequent headaches are also present throughout childhood and lasted into adulthood.  With her 2 pregnancies she had exacerbations of OCD and depression after delivery.  For her OCD, obsessions tend to ruminate on cleanliness which are followed by compulsions of cleaning which last for 2 hours every weekday and then an extended period of cleaning the entire house on Saturdays.  This was exacerbated after finding a bedbug in the home in March 2024 with experiencing extreme low mood for the first time without suicidal ideation.  Has not been experiencing as much enjoyment in day-to-day activities so would classify depression while improved as ongoing and mild.  Prozac has the best data for an SSRI for OCD which should address the depression and anxiety she experiences as well.  She takes Topamax for her headaches and is eating roughly 2 meals per day.  Her preference would be to primarily do medication for now and once anxiety shifts potentially pursue psychotherapy.  Follow-up in 1 month.  For safety, her acute risk factors for suicide are: Current diagnosis of  depression, newborn in the home.  Her chronic risk factors for suicide are: Chronic mental health concerns, access to firearms.  Her protective factors are: Minor children living in the home, supportive family and friends, employment beloved pets, actively seeking and engaging with mental health care, no suicidal ideation, guns are secured in a gun safe.  While future events cannot be fully predicted she does not currently meet IVC criteria and can be continued as an outpatient.  Plan:  # OCD  generalized anxiety disorder Past medication trials:  Status of problem: New to provider Interventions: -- Start Prozac 10 mg once daily (s6/18/24) --Consider psychotherapy (CBT/ERP) when patient amenable  # Mild recurrent major depressive disorder Past medication trials:  Status of problem: New to provider Interventions: -- Prozac as above --Coordinate with PCP to get vitamin D, B12, folate  # Chronic headaches Past medication trials: Topamax, amitriptyline Status of problem: New to provider Interventions: -- Continue Topamax 50 mg nightly per PCP,  Patient was given contact information for behavioral health clinic and was instructed to call 911 for emergencies.   Subjective:  Chief Complaint:  Chief Complaint  Patient presents with   OCD   Anxiety   Establish Care    History of Present Illness:  Thinks she was referred for OCD. Has had some issues recently but those have been present for awhile. Had her daughter in 2019 and had some post partum issues that she didn't get treated. Even prior to this has always had a touch of OCD. In March 2024 started to get welts and didn't know what they were. Dermatologist thought she had mites but  found a single bed bug later on. After this incident vacuums everyday but is not her only issue. After that had a low period where she didn't care if she was alive and specifically did not want to do or have SI but didn't have any joy. This has now passed.  Saw a therapist at age 36 after her parents separated and her sister died.  Lives with husband, daughter 18.99 years old, son 41 months, and dog. Everyone gets along with the exception of moods in her 35 year old. Works M-F in [place]. For fun they are building a treehouse, playing with her children, or going to a friend's house. Still enjoying for the most part but worries about the mess they are making in the process. Has had headaches everyday since having her daughter, possibly even before. Was put on topamax be PCP for this which has also helped with sleep. Getting around 7hrs per night. Has vivid dreams. Snored when pregnant but none outside of that. No restless legs. Caffeine is 1 cup of coffee in the morning. Appetite is usually 2 meals per day without breakfast. No binge episodes. Denies intentional restriction but never been much of a breakfast person. Does want to be smaller than she is. No purging. Concentration adequate. Fidgets with hands. Struggles with guilt feelings. SI as above was in April to first part of May 2024; lasted for 3-4 weeks. Only time she has ever felt hopeless. Since childbirth feels less happy as an individual but seeing hope for the future.   Chronic worry across multiple domains with muscle tension and has had panic attacks. Panic are about 3 total. Does fine in crowds and social settings. No period of sleeplessness. No hallucinations. After the bug incident had trouble going to sleep and would feel like things were crawling on her. Had some paranoia as well which is present to a degree. Obsession thought is around cleanliness as above and anxiety is relieved only when cleaning which of the 4 hours she is at home at the end of the day spends 2 of them cleaning. Has to plan for cleaning the house completely once per week. Has rigidity around cleaning schedule.   No alcohol, tobacco, drugs lifelong.    Past Psychiatric History:  Diagnoses: OCD, adjustment reaction with  anxiety and depression Medication trials: topamax (effective for headache), amitriptyline (ineffective for headache) Previous psychiatrist/therapist: yes to therapy Hospitalizations: none Suicide attempts: none SIB: none Hx of violence towards others: none Current access to guns: yes secured in gun safe Hx of trauma/abuse: none  Previous Psychotropic Medications: No   Substance Abuse History in the last 12 months:  No.  Past Medical History:  Past Medical History:  Diagnosis Date   Anemia    Anxiety    Postpartum   Chronic pelvic pain in female    Constipation 01/10/2015   Depression    Postpartum   H/O chronic cystitis    History of recurrent UTIs    History of urethral stricture    Nephrolithiasis    bilateral non-obstructive per urologist note 06-16-2017  -- dr Marlou Porch   Normal labor 11/10/2018   Pregnancy 12/11/2021   Retained placenta 11/11/2018   Rib pain 12/26/2022    Past Surgical History:  Procedure Laterality Date   AUGMENTATION MAMMAPLASTY Bilateral 2009   CHROMOPERTUBATION N/A 08/12/2017   Procedure: CHROMOPERTUBATION;  Surgeon: Mitchel Honour, DO;  Location: [facility] [place];  Service: Gynecology;  Laterality: N/A;   CYSTO/  URETHRAL DILATION  11/09/2004   DILATION AND EVACUATION N/A 11/11/2018   Procedure: DILATATION AND EVACUATION;  Surgeon: Zelphia Cairo, MD;  Location: [facility];  Service: Gynecology;  Laterality: N/A;   LAPAROSCOPY N/A 08/12/2017   Procedure: LAPAROSCOPY DIAGNOSTIC [place] OF ENDOMETRIOSI;  Surgeon: Mitchel Honour, DO;  Location:  SURGERY CENTER;  Service: Gynecology;  Laterality: N/A;   WISDOM TOOTH EXTRACTION  03/2016    Family Psychiatric History: father history of alcoholism, uncle died from alcoholism, mother alcoholism  Family History:  Family History  Problem Relation Age of Onset   Diabetes Maternal Grandmother    Diabetes Maternal Grandfather    Cancer Paternal Grandmother    Diabetes  Paternal Grandmother    Cancer Paternal Grandfather    Diabetes Paternal Grandfather     Social History:   Social History   Socioeconomic History   Marital status: Married    Spouse name: Not on file   Number of children: Not on file   Years of education: Not on file   Highest education level: Not on file  Occupational History   Not on file  Tobacco Use   Smoking status: Never   Smokeless tobacco: Never  Vaping Use   Vaping Use: Never used  Substance and Sexual Activity   Alcohol use: Not Currently   Drug use: Never   Sexual activity: Yes    Birth control/protection: OCP  Other Topics Concern   Not on file  Social History Narrative   Not on file   Social Determinants of Health   Financial Resource Strain: Not on file  Food Insecurity: Not on file  Transportation Needs: Not on file  Physical Activity: Not on file  Stress: Not on file  Social Connections: Not on file   Additional Social History: updated  Allergies:   Allergies  Allergen Reactions   Amoxicillin Hives    Has patient had a PCN reaction causing immediate rash, facial/tongue/throat swelling, SOB or lightheadedness with hypotension: Yes Has patient had a PCN reaction causing severe rash involving mucus membranes or skin necrosis: Yes Has patient had a PCN reaction that required hospitalization: No Has patient had a PCN reaction occurring within the last 10 years: No If all of the above answers are "NO", then may proceed with Cephalosporin use.   Penicillins Hives    Has patient had a PCN reaction causing immediate rash, facial/tongue/throat swelling, SOB or lightheadedness with hypotension: Yes Has patient had a PCN reaction causing severe rash involving mucus membranes or skin necrosis: Yes Has patient had a PCN reaction that required hospitalization: No Has patient had a PCN reaction occurring within the last 10 years: No If all of the above answers are "NO", then may proceed with Cephalosporin  use.    Sulfa Antibiotics Hives    Current Medications: Current Outpatient Medications  Medication Sig Dispense Refill   FLUoxetine (PROZAC) 10 MG capsule Take 1 capsule (10 mg total) by mouth daily. 30 capsule 1   ibuprofen (ADVIL) 600 MG tablet Take 1 tablet (600 mg total) by mouth every 6 (six) hours as needed. 60 tablet 0   NIKKI 3-0.02 MG tablet Take 1 tablet by mouth daily.     Prenatal Vit-Fe Fumarate-FA (PRENATAL MULTIVITAMIN) TABS tablet Take 1 tablet by mouth daily at 12 noon. 60 tablet 1   topiramate (TOPAMAX) 50 MG tablet Take 50 mg by mouth at bedtime. For headache prevention     No current facility-administered medications for this visit.    ROS: Review of  Systems  Constitutional:  Negative for appetite change and unexpected weight change.  Gastrointestinal:  Negative for constipation, diarrhea, nausea and vomiting.  Endocrine: Positive for heat intolerance. Negative for cold intolerance and polyphagia.  Musculoskeletal:  Negative for arthralgias and back pain.  Skin:        No hair loss  Neurological:  Positive for headaches. Negative for dizziness.  Psychiatric/Behavioral:  Positive for dysphoric mood. Negative for decreased concentration, hallucinations, self-injury, sleep disturbance and suicidal ideas. The patient is nervous/anxious.     Objective:  Psychiatric Specialty Exam: not currently breastfeeding.There is no height or weight on file to calculate BMI.  General Appearance: Neat, Well Groomed, and appears stated age  Eye Contact:  Good  Speech:  Clear and Coherent and Normal Rate  Volume:  Normal  Mood:   " I need help with OCD"  Affect:  Appropriate, Congruent, and anxious but overall bright  Thought Content: Logical, Hallucinations: None, and Rumination   Suicidal Thoughts:  No  Homicidal Thoughts:  No  Thought Process:  Coherent, Goal Directed, and Linear  Orientation:  Full (Time, Place, and Person)    Memory: Grossly intact   Judgment:  Good   Insight:  Fair  Concentration:  Concentration: Good and Attention Span: Good  Recall:  not formally assessed   Fund of Knowledge: Good  Language: Good  Psychomotor Activity:  Normal  Akathisia:  No  AIMS (if indicated): not done  Assets:  Communication Skills Desire for Improvement Financial Resources/Insurance Housing Intimacy Leisure Time Physical Health Resilience Social Support Talents/Skills Transportation Vocational/Educational  ADL's:  Intact  Cognition: WNL  Sleep:  Good   PE: General: sits comfortably in view of camera; no acute distress  Pulm: no increased work of breathing on room air  MSK: all extremity movements appear intact  Neuro: no focal neurological deficits observed  Gait & Station: unable to assess by video    Metabolic Disorder Labs: No results found for: "HGBA1C", "MPG" No results found for: "PROLACTIN" Lab Results  Component Value Date   CHOL 174 01/10/2015   TRIG 102.0 01/10/2015   HDL 55.60 01/10/2015   CHOLHDL 3 01/10/2015   VLDL 20.4 01/10/2015   LDLCALC 98 01/10/2015   Lab Results  Component Value Date   TSH 1.24 12/26/2022    Therapeutic Level Labs: No results found for: "LITHIUM" No results found for: "CBMZ" No results found for: "VALPROATE"  Screenings:  GAD-7    Flowsheet Row Office Visit from 04/16/2023 in [facility]  Total GAD-7 Score 19      PHQ2-9    Flowsheet Row Office Visit from 06/03/2023 in [facility] [place] Office Visit from 04[facility]16/2023 in [facility] [place] Office Visit from 12/26/2022 in [facility] [place]  PHQ-2 Total Score 2 6 0  PHQ-9 Total Score 6 15 --      Flowsheet Row Office Visit from 06/03/2023 in [facility] at [place] Admission (Discharged) from 12/11/2021 in [place] 5S Mother Baby Unit Admission (Discharged) from 05/16/2021 in [facility] 1S Maternity  Assessment Unit  C-SSRS RISK CATEGORY No Risk No Risk No Risk       Collaboration of Care: Collaboration of Care: Medication Management AEB as above and Primary Care Provider AEB as above  Patient/Guardian was advised Release of Information must be obtained prior to any record release in order to collaborate their care with an outside provider. Patient/Guardian  was advised if they have not already done so to contact the registration department to sign all necessary forms in order for [place] to release information regarding their care.   Consent: Patient/Guardian gives verbal consent for treatment and assignment of benefits for services provided during this visit. Patient/Guardian expressed understanding and agreed to proceed.   Televisit via video: I connected with Nastassja Bielat Fadeley on 06/03/23 at  8:00 AM EDT by a video enabled telemedicine application and verified that I am speaking with the correct person using two identifiers.  Location: Patient: [facility] Provider: home office   I discussed the limitations of evaluation and management by telemedicine and the availability of in person appointments. The patient expressed understanding and agreed to proceed.  I discussed the assessment and treatment plan with the patient. The patient was provided an opportunity to ask questions and all were answered. The patient agreed with the plan and demonstrated an understanding of the instructions.   The patient was advised to call back or seek an in-person evaluation if the symptoms worsen or if the condition fails to improve as anticipated.  I provided 60 minutes of non-face-to-face time during this encounter.  Elsie Lincoln, MD 6/18/20249:07 AM

## 2023-06-03 NOTE — Patient Instructions (Signed)
We started fluoxetine (Prozac) 10 mg once daily today.  This should help with OCD, anxiety, depression.  Over time we may consider psychotherapy which will also help with the above and the modality that I would encourage you to ask your insurer about would be CBT (cognitive behavioral therapy) and/or ERP (exposure and response prevention).  I will coordinate with your PCP to get vitamin studies.

## 2023-06-11 ENCOUNTER — Encounter (HOSPITAL_COMMUNITY): Payer: Self-pay

## 2023-06-11 ENCOUNTER — Other Ambulatory Visit (INDEPENDENT_AMBULATORY_CARE_PROVIDER_SITE_OTHER): Payer: No Typology Code available for payment source

## 2023-06-11 ENCOUNTER — Ambulatory Visit (INDEPENDENT_AMBULATORY_CARE_PROVIDER_SITE_OTHER): Payer: No Typology Code available for payment source | Admitting: Clinical

## 2023-06-11 DIAGNOSIS — F422 Mixed obsessional thoughts and acts: Secondary | ICD-10-CM | POA: Diagnosis not present

## 2023-06-11 DIAGNOSIS — F411 Generalized anxiety disorder: Secondary | ICD-10-CM

## 2023-06-11 DIAGNOSIS — F330 Major depressive disorder, recurrent, mild: Secondary | ICD-10-CM | POA: Diagnosis not present

## 2023-06-11 NOTE — Progress Notes (Signed)
                Orange Hilligoss, LCSW 
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
## 2023-06-12 LAB — VITAMIN B12: Vitamin B-12: 252 pg/mL (ref 211–911)

## 2023-06-12 LAB — FOLATE: Folate: >23.8 ng/mL (ref >5.9)

## 2023-06-12 LAB — VITAMIN D 25 HYDROXY (VIT D DEFICIENCY, FRACTURES): VITD: 50.28 ng/mL (ref 30.00–100.00)

## 2023-06-19 ENCOUNTER — Other Ambulatory Visit: Payer: Self-pay | Admitting: Primary Care

## 2023-06-19 DIAGNOSIS — R519 Headache, unspecified: Secondary | ICD-10-CM

## 2023-06-25 ENCOUNTER — Other Ambulatory Visit (HOSPITAL_COMMUNITY): Payer: Self-pay | Admitting: Psychiatry

## 2023-06-25 DIAGNOSIS — F330 Major depressive disorder, recurrent, mild: Secondary | ICD-10-CM

## 2023-06-25 DIAGNOSIS — F422 Mixed obsessional thoughts and acts: Secondary | ICD-10-CM

## 2023-06-25 DIAGNOSIS — F411 Generalized anxiety disorder: Secondary | ICD-10-CM

## 2023-06-26 ENCOUNTER — Encounter (HOSPITAL_COMMUNITY): Payer: Self-pay

## 2023-07-01 ENCOUNTER — Telehealth (INDEPENDENT_AMBULATORY_CARE_PROVIDER_SITE_OTHER): Payer: No Typology Code available for payment source | Admitting: Psychiatry

## 2023-07-01 ENCOUNTER — Encounter (HOSPITAL_COMMUNITY): Payer: Self-pay | Admitting: Psychiatry

## 2023-07-01 DIAGNOSIS — F330 Major depressive disorder, recurrent, mild: Secondary | ICD-10-CM | POA: Diagnosis not present

## 2023-07-01 DIAGNOSIS — R519 Headache, unspecified: Secondary | ICD-10-CM

## 2023-07-01 DIAGNOSIS — F411 Generalized anxiety disorder: Secondary | ICD-10-CM | POA: Diagnosis not present

## 2023-07-01 DIAGNOSIS — F422 Mixed obsessional thoughts and acts: Secondary | ICD-10-CM | POA: Diagnosis not present

## 2023-07-01 MED ORDER — FLUOXETINE HCL 20 MG PO CAPS: 20.0000 mg | ORAL_CAPSULE | Freq: Every day | ORAL | 0 refills | Status: DC

## 2023-07-01 NOTE — Progress Notes (Signed)
BH MD Outpatient Progress Note  07/01/2023 10:00 AM Stacey Houston  MRN:  [id number]  Assessment:  Stacey Houston presents for follow-up evaluation. Today, 07/01/23, patient reports slight improvement to OCD with less distress when missing a day of vacuuming since starting the Prozac.  Not much progress yet on anxiety depression but feels she is at least holding steady.  She was able to come off of Topamax in between appointments and is finding slightly improved sleep with this.  Appetite has not shifted much beyond the 2 meals per day and vitamin D, B12, folate all within normal limits though vitamin B12 was at 252 which was at the low end of normal.  She may start a vitamin B-12 supplement in addition to her multivitamin.  She is still on concurrent amitriptyline for chronic headaches and finding sleep benefit from it.  With dose being only at 10 mg should still be safe to take with the Prozac and we will continue to monitor for signs and symptoms of serotonin syndrome.  She was able to get established with psychotherapy and likes her therapist with next appointment in August.  Follow-up in 1 month.   For safety, her acute risk factors for suicide are: Current diagnosis of depression, newborn in the home.  Her chronic risk factors for suicide are: Chronic mental health concerns, access to firearms.  Her protective factors are: Minor children living in the home, supportive family and friends, employment beloved pets, actively seeking and engaging with mental health care, no suicidal ideation, guns are secured in a gun safe.  While future events cannot be fully predicted she does not currently meet IVC criteria and can be continued as an outpatient.  Identifying Information: Stacey Houston is a 37 y.o. female with a history of OCD, generalized anxiety disorder, postpartum depression with worsening of OCD, recurrent major depressive disorder, chronic headaches who is an established patient with  [facility] participating in follow-up via video conferencing. Initial evaluation of OCD on 06/03/2023; please see that note for full case formulation.  Patient reported parents divorced at age 71 and with her mother leaving she does not have much of a relationship with her noting ongoing alcoholism.  Her sister also died around the same time as parents divorce and she saw psychotherapy briefly for this.  She denied a history of trauma but with circumstances of the above suspect this could have possible origin point for psychologic etiology of OCD.  Frequent headaches were also present throughout childhood and lasted into adulthood.  With her 2 pregnancies she had exacerbations of OCD and depression after delivery.  For her OCD, obsessions tend to ruminate on cleanliness which were followed by compulsions of cleaning which last for 2 hours every weekday and then an extended period of cleaning the entire house on Saturdays.  This was exacerbated after finding a bedbug in the home in March 2024 with experiencing extreme low mood for the first time without suicidal ideation.  Was not experiencing as much enjoyment in day-to-day activities so classified depression while improved as ongoing and mild.  Prozac had the best data for an SSRI for OCD which should address the depression and anxiety she experienced as well.  She took Topamax for her headaches and was eating roughly 2 meals per day.      Plan:   # OCD  generalized anxiety disorder Past medication trials:  Status of problem: Improving Interventions: -- Titrate Prozac to 20 mg once daily (s6/18/24,  i7/16/24) --Continue amitriptyline 10 mg nightly for now -- Continue psychotherapy   # Mild recurrent major depressive disorder Past medication trials:  Status of problem: Chronic and stable Interventions: -- Prozac, amitriptyline, psychotherapy as above   # Chronic headaches Past medication trials: Topamax, amitriptyline Status  of problem: Chronic and stable Interventions: -- Continue amitriptyline as above  Patient was given contact information for behavioral health clinic and was instructed to call 911 for emergencies.   Subjective:  Chief Complaint:  Chief Complaint  Patient presents with   OCD   Anxiety   Depression   Follow-up    Interval History: Things have been good. Was able to come off the topamax like previously discussed. Would be fine to increase prozac if ok with the amitriptyline; still on 10mg  nightly. Thinks she may be a little less uptight with the prozac thus far; if she missed a day of vacuuming less distressed. Not much shift in appetite with coming off topamax, still 2 meals per day. Not much change to the anxiety/depression just yet. Amitriptyline still helpful for sleep, topamax had been making sleep worse; currently 8hrs. Was able to get established with therapy and will see again 07/17/23.  Reviewed blood work and she may look into starting a B12 supplement for low end of normal level.  Visit Diagnosis:    ICD-10-CM   1. Mixed obsessional thoughts and acts  F42.2 FLUoxetine (PROZAC) 20 MG capsule    2. Generalized anxiety disorder  F41.1 FLUoxetine (PROZAC) 20 MG capsule    3. Mild episode of recurrent major depressive disorder (HCC)  F33.0 FLUoxetine (PROZAC) 20 MG capsule    4. Frequent headaches  R51.9       Past Psychiatric History:  Diagnoses: OCD, generalized anxiety disorder, postpartum depression with worsening of OCD, recurrent major depressive disorder Medication trials: topamax (effective for headache), amitriptyline (ineffective for headache)  Previous psychiatrist/therapist: yes to therapy Hospitalizations: none Suicide attempts: none SIB: none Hx of violence towards others: none Current access to guns: yes secured in gun safe Hx of trauma/abuse: none Substance use: none  Past Medical History:  Past Medical History:  Diagnosis Date   Anemia    Anxiety     Postpartum   Chronic pelvic pain in female    Constipation 01/10/2015   Depression    Postpartum   H/O chronic cystitis    History of recurrent UTIs    History of urethral stricture    Nephrolithiasis    bilateral non-obstructive per urologist note 06-16-2017  -- dr Marlou Porch   Normal labor 11/10/2018   Pregnancy 12/11/2021   Retained placenta 11/11/2018   Rib pain 12/26/2022    Past Surgical History:  Procedure Laterality Date   AUGMENTATION MAMMAPLASTY Bilateral 2009   CHROMOPERTUBATION N/A 08/12/2017   Procedure: CHROMOPERTUBATION;  Surgeon: Mitchel Honour, DO;  Location: [facility];  Service: Gynecology;  Laterality: N/A;   CYSTO/  URETHRAL DILATION  11/09/2004   DILATION AND EVACUATION N/A 11/11/2018   Procedure: DILATATION AND EVACUATION;  Surgeon: Zelphia Cairo, MD;  Location: [facility];  Service: Gynecology;  Laterality: N/A;   LAPAROSCOPY N/A 08/12/2017   Procedure: LAPAROSCOPY DIAGNOSTIC Fulgeration OF ENDOMETRIOSI;  Surgeon: Mitchel Honour, DO;  Location: [facility];  Service: Gynecology;  Laterality: N/A;   WISDOM TOOTH EXTRACTION  03/2016    Family Psychiatric History: father history of alcoholism, uncle died from alcoholism, mother alcoholism   Family History:  Family History  Problem Relation Age of Onset  Diabetes Maternal Grandmother    Diabetes Maternal Grandfather    Cancer Paternal Grandmother    Diabetes Paternal Grandmother    Cancer Paternal Grandfather    Diabetes Paternal Grandfather     Social History:  Academic/Vocational: Employed  Social History   Socioeconomic History   Marital status: Married    Spouse name: Not on file   Number of children: Not on file   Years of education: Not on file   Highest education level: Not on file  Occupational History   Not on file  Tobacco Use   Smoking status: Never   Smokeless tobacco: Never  Vaping Use   Vaping status: Never Used  Substance and Sexual  Activity   Alcohol use: Not Currently   Drug use: Never   Sexual activity: Yes    Birth control/protection: OCP  Other Topics Concern   Not on file  Social History Narrative   Not on file   Social Determinants of Health   Financial Resource Strain: Not on file  Food Insecurity: Not on file  Transportation Needs: Not on file  Physical Activity: Not on file  Stress: Not on file  Social Connections: Not on file    Allergies:  Allergies  Allergen Reactions   Amoxicillin Hives    Has patient had a PCN reaction causing immediate rash, facial/tongue/throat swelling, SOB or lightheadedness with hypotension: Yes Has patient had a PCN reaction causing severe rash involving mucus membranes or skin necrosis: Yes Has patient had a PCN reaction that required hospitalization: No Has patient had a PCN reaction occurring within the last 10 years: No If all of the above answers are "NO", then may proceed with Cephalosporin use.   Penicillins Hives    Has patient had a PCN reaction causing immediate rash, facial/tongue/throat swelling, SOB or lightheadedness with hypotension: Yes Has patient had a PCN reaction causing severe rash involving mucus membranes or skin necrosis: Yes Has patient had a PCN reaction that required hospitalization: No Has patient had a PCN reaction occurring within the last 10 years: No If all of the above answers are "NO", then may proceed with Cephalosporin use.    Sulfa Antibiotics Hives    Current Medications: Current Outpatient Medications  Medication Sig Dispense Refill   amitriptyline (ELAVIL) 10 MG tablet Take 10 mg by mouth at bedtime. For headaches     FLUoxetine (PROZAC) 20 MG capsule Take 1 capsule (20 mg total) by mouth daily. 90 capsule 0   ibuprofen (ADVIL) 600 MG tablet Take 1 tablet (600 mg total) by mouth every 6 (six) hours as needed. 60 tablet 0   NIKKI 3-0.02 MG tablet Take 1 tablet by mouth daily.     Prenatal Vit-Fe Fumarate-FA (PRENATAL  MULTIVITAMIN) TABS tablet Take 1 tablet by mouth daily at 12 noon. 60 tablet 1   No current facility-administered medications for this visit.    ROS: Review of Systems  Constitutional:  Negative for appetite change and unexpected weight change.  Endocrine: Positive for heat intolerance. Negative for cold intolerance and polyphagia.  Psychiatric/Behavioral:  Positive for dysphoric mood. Negative for decreased concentration, hallucinations, self-injury, sleep disturbance and suicidal ideas. The patient is nervous/anxious.     Objective:  Psychiatric Specialty Exam: not currently breastfeeding.There is no height or weight on file to calculate BMI.  General Appearance: Neat, Well Groomed, and appears stated age  Eye Contact:  Good  Speech:  Clear and Coherent and Normal Rate  Volume:  Normal  Mood:   "Things  have been good"  Affect:  Appropriate, Congruent, Constricted, and overall still bright but anxious  Thought Content: Logical, Hallucinations: None, and Rumination on cleanliness  Suicidal Thoughts:  No  Homicidal Thoughts:  No  Thought Process:  Coherent, Goal Directed, and Linear  Orientation:  Full (Time, Place, and Person)    Memory:  Grossly intact   Judgment:  Good  Insight:  Good  Concentration:  Concentration: Good  Recall:  not formally assessed   Fund of Knowledge: Good  Language: Good  Psychomotor Activity:  Normal  Akathisia:  No  AIMS (if indicated): not done  Assets:  Communication Skills Desire for Improvement Financial Resources/Insurance Housing Intimacy Leisure Time Physical Health Resilience Social Support Talents/Skills Transportation Vocational/Educational  ADL's:  Intact  Cognition: WNL  Sleep:  Fair   PE: General: sits comfortably in view of camera; no acute distress  Pulm: no increased work of breathing on room air  MSK: all extremity movements appear intact  Neuro: no focal neurological deficits observed  Gait & Station: unable to  assess by video    Metabolic Disorder Labs: No results found for: "HGBA1C", "MPG" No results found for: "PROLACTIN" Lab Results  Component Value Date   CHOL 174 01/10/2015   TRIG 102.0 01/10/2015   HDL 55.60 01/10/2015   CHOLHDL 3 01/10/2015   VLDL 20.4 01/10/2015   LDLCALC 98 01/10/2015   Lab Results  Component Value Date   TSH 1.24 12/26/2022   TSH 1.23 01/10/2015    Therapeutic Level Labs: No results found for: "LITHIUM" No results found for: "VALPROATE" No results found for: "CBMZ"  Screenings:  GAD-7    Flowsheet Row Office Visit from [facility]16/2023 in [facility]  Total GAD-7 Score 19      PHQ2-9    Flowsheet Row Office Visit from 06/03/2023 in [facility] [place] Office Visit from 04/16/2023 in [facility] [place] Office Visit from 12/26/2022 in [facility] [place]  PHQ-2 Total Score 2 6 0  PHQ-9 Total Score 6 15 --      Flowsheet Row Office Visit from 06/03/2023 in [facility] [place] Admission (Discharged) from 12/11/2021 in [facility] 5S Mother Baby Unit Admission (Discharged) from 05/16/2021 in [facility] 1S Maternity Assessment Unit  C-SSRS RISK CATEGORY No Risk No Risk No Risk       Collaboration of Care: Collaboration of Care: Medication Management AEB as above, Primary Care Provider AEB as above, and Referral or follow-up with counselor/therapist AEB as above  Patient/Guardian was advised Release of Information must be obtained prior to any record release in order to collaborate their care with an outside provider. Patient/Guardian was advised if they have not already done so to contact the registration department to sign all necessary forms in order for [place] to release information regarding their care.   Consent: Patient/Guardian gives verbal consent for treatment and assignment of benefits for services provided during  this visit. Patient/Guardian expressed understanding and agreed to proceed.   Televisit via video: I connected with patient on 07/01/23 at  9:30 AM EDT by a video enabled telemedicine application and verified that I am speaking with the correct person using two identifiers.  Location: Patient: at work in [place] Provider: remote office in [place]   I discussed the limitations of evaluation and management by telemedicine and the availability of in person appointments. The patient expressed understanding and agreed to proceed.  I discussed the assessment and treatment plan with the patient. The patient was provided an opportunity to ask questions and all were answered. The patient agreed with the plan and demonstrated an understanding of the instructions.   The patient was advised to call back or seek an in-person evaluation if the symptoms worsen or if the condition fails to improve as anticipated.  I provided 15 minutes of virtual face-to-face time including blood work review during this encounter.  Elsie Lincoln, MD 07/01/2023, 10:00 AM

## 2023-07-01 NOTE — Patient Instructions (Signed)
We increased the Prozac (fluoxetine) to 20 mg once daily today.  It is okay if you want to start taking B12 supplement for your low end of normal level.  We will continue to monitor for signs of serotonin syndrome while you are on Prozac and amitriptyline.  This can include flushing, racing heart with palpitations, tremor, excessive sweating.  If you notice any of these signs that let our office know immediately.

## 2023-07-03 ENCOUNTER — Other Ambulatory Visit: Payer: Self-pay | Admitting: Primary Care

## 2023-07-03 DIAGNOSIS — R519 Headache, unspecified: Secondary | ICD-10-CM

## 2023-07-09 ENCOUNTER — Ambulatory Visit
Admission: EM | Admit: 2023-07-09 | Discharge: 2023-07-09 | Disposition: A | Discharge status: Home / Self Care | Payer: No Typology Code available for payment source | Attending: Emergency Medicine | Admitting: Emergency Medicine

## 2023-07-09 ENCOUNTER — Encounter: Payer: Self-pay | Admitting: Emergency Medicine

## 2023-07-09 DIAGNOSIS — J020 Streptococcal pharyngitis: Secondary | ICD-10-CM

## 2023-07-09 LAB — POCT RAPID STREP A (OFFICE): Rapid Strep A Screen: POSITIVE — AB

## 2023-07-09 MED ORDER — CEFDINIR 300 MG PO CAPS: 300.0000 mg | ORAL_CAPSULE | Freq: Two times a day (BID) | ORAL | 0 refills | Status: AC

## 2023-07-09 NOTE — Discharge Instructions (Addendum)
Your rapid strep test today was positive  Take cefdinir twice a day for the next 10 days, daily will see improvement in about 48 hours and steady progression from there  To be use of salt gargles throat lozenges, warm liquids, teaspoons of honey and over-the-counter clippers septic spray for comfort  May give Tylenol or Motrin every 6 hours as needed for additional comfort  You may follow-up at urgent care as needed

## 2023-07-09 NOTE — ED Provider Notes (Signed)
Stacey Houston    CSN: [id number] Arrival date & time: 07/09/23  1007      History   Chief Complaint Chief Complaint  Patient presents with   Sore Throat    HPI Stacey Houston is a 37 y.o. female.   Patient presents for evaluation of subjective fever, chills, body aches and a sore throat beginning 1 day ago.  Tolerating food and liquids.  Known sick contacts with exposure to strep within household.  Past Medical History:  Diagnosis Date   Anemia    Anxiety    Postpartum   Chronic pelvic pain in female    Constipation 01/10/2015   Depression    Postpartum   H/O chronic cystitis    History of recurrent UTIs    History of urethral stricture    Nephrolithiasis    bilateral non-obstructive per urologist note 06-16-2017  -- dr Marlou Porch   Normal labor 11/10/2018   Pregnancy 12/11/2021   Retained placenta 11/11/2018   Rib pain 12/26/2022    Patient Active Problem List   Diagnosis Date Noted   OCD (obsessive compulsive disorder) 06/03/2023   Generalized anxiety disorder 06/03/2023   Mild episode of recurrent major depressive disorder (HCC) 06/03/2023   Adjustment reaction with anxiety and depression 12/26/2022   Frequent headaches 12/26/2022   Abnormal thyroid blood test 12/26/2022    Past Surgical History:  Procedure Laterality Date   AUGMENTATION MAMMAPLASTY Bilateral 2009   CHROMOPERTUBATION N/A 08/12/2017   Procedure: CHROMOPERTUBATION;  Surgeon: Mitchel Honour, DO;  Location: [facility];  Service: Gynecology;  Laterality: N/A;   CYSTO/  URETHRAL DILATION  11/09/2004   DILATION AND EVACUATION N/A 11/11/2018   Procedure: DILATATION AND EVACUATION;  Surgeon: Zelphia Cairo, MD;  Location: [facility];  Service: Gynecology;  Laterality: N/A;   LAPAROSCOPY N/A 08/12/2017   Procedure: LAPAROSCOPY DIAGNOSTIC Fulgeration OF ENDOMETRIOSI;  Surgeon: Mitchel Honour, DO;  Location: [facility];  Service: Gynecology;   Laterality: N/A;   WISDOM TOOTH EXTRACTION  03/2016    OB History     Gravida  2   Para  2   Term  2   Preterm      AB      Living  2      SAB      IAB      Ectopic      Multiple  0   Live Births  2            Home Medications    Prior to Admission medications   Medication Sig Start Date End Date Taking? Authorizing Provider  cefdinir (OMNICEF) 300 MG capsule Take 1 capsule (300 mg total) by mouth 2 (two) times daily for 10 days. 07/09/23 07/19/23 Yes Tywone Bembenek R, NP  amitriptyline (ELAVIL) 10 MG tablet TAKE 1 TABLET (10 MG TOTAL) BY MOUTH AT BEDTIME. FOR HEADACHE PREVENTION 07/03/23   Doreene Nest, NP  FLUoxetine (PROZAC) 20 MG capsule Take 1 capsule (20 mg total) by mouth daily. 07/01/23   Elsie Lincoln, MD  ibuprofen (ADVIL) 600 MG tablet Take 1 tablet (600 mg total) by mouth every 6 (six) hours as needed. 12/12/21   Harold Hedge, MD  NIKKI 3-0.02 MG tablet Take 1 tablet by mouth daily. 11/18/22   [provider]  Prenatal Vit-Fe Fumarate-FA (PRENATAL MULTIVITAMIN) TABS tablet Take 1 tablet by mouth daily at 12 noon. 12/12/21   Harold Hedge, MD    Family History Family History  Problem Relation Age of Onset   Diabetes Maternal Grandmother    Diabetes Maternal Grandfather    Cancer Paternal Grandmother    Diabetes Paternal Grandmother    Cancer Paternal Grandfather    Diabetes Paternal Grandfather     Social History Social History   Tobacco Use   Smoking status: Never   Smokeless tobacco: Never  Vaping Use   Vaping status: Never Used  Substance Use Topics   Alcohol use: Not Currently   Drug use: Never     Allergies   Amoxicillin, Penicillins, and Sulfa antibiotics   Review of Systems Review of Systems   Physical Exam Triage Vital Signs ED Triage Vitals  Encounter Vitals Group     BP 07/09/23 1030 108/73     Systolic BP Percentile --      Diastolic BP Percentile --      Pulse Rate 07/09/23 1030 95      Resp 07/09/23 1030 16     Temp 07/09/23 1030 98.9 F (37.2 C)     Temp Source 07/09/23 1030 Oral     SpO2 07/09/23 1030 98 %     Weight --      Height --      Head Circumference --      Peak Flow --      Pain Score 07/09/23 1033 4     Pain Loc --      Pain Education --      Exclude from Growth Chart --    No data found.  Updated Vital Signs BP 108/73 (BP Location: Right Arm)   Pulse 95   Temp 98.9 F (37.2 C) (Oral)   Resp 16   LMP 06/22/2023   SpO2 98%   Visual Acuity Right Eye Distance:   Left Eye Distance:   Bilateral Distance:    Right Eye Near:   Left Eye Near:    Bilateral Near:     Physical Exam Constitutional:      Appearance: She is well-developed.  HENT:     Head: Normocephalic.     Right Ear: Tympanic membrane and ear canal normal.     Left Ear: Tympanic membrane and ear canal normal.     Nose: No congestion or rhinorrhea.     Mouth/Throat:     Pharynx: Posterior oropharyngeal erythema present.     Tonsils: No tonsillar exudate. 1+ on the right. 1+ on the left.  Pulmonary:     Effort: Pulmonary effort is normal.  Musculoskeletal:     Cervical back: Normal range of motion.  Lymphadenopathy:     Cervical: Cervical adenopathy present.  Skin:    General: Skin is warm and dry.  Neurological:     General: No focal deficit present.     Mental Status: She is alert and oriented to person, place, and time.      UC Treatments / Results  Labs (all labs ordered are listed, but only abnormal results are displayed) Labs Reviewed  POCT RAPID STREP A (OFFICE) - Abnormal; Notable for the following components:      Result Value   Rapid Strep A Screen Positive (*)    All other components within normal limits    EKG   Radiology No results found.  Procedures Procedures (including critical care time)  Medications Ordered in UC Medications - No data to display  Initial Impression / Assessment and Plan / UC Course  I have reviewed the triage vital  signs and the nursing notes.  Pertinent labs & imaging results that were available during my care of the patient were reviewed by me and considered in my medical decision making (see chart for details).  Strep pharyngitis  Rapid testing positive, discussed with patient, cefdinir 10-day course prescribed, may attempt salt water gargles, throat lozenges, warm to cool liquids per preference, over-the-counter Chloraseptic spray and over-the-counter analgesics for management of discomfort, may follow-up with urgent care as needed if symptoms persist or worsen, note given  Final Clinical Impressions(s) / UC Diagnoses   Final diagnoses:  Strep pharyngitis     Discharge Instructions      Your rapid strep test today was positive  Take cefdinir  twice a day for the next 10 days, daily will see improvement in about 48 hours and steady progression from there  To be use of salt gargles throat lozenges, warm liquids, teaspoons of honey and over-the-counter clippers septic spray for comfort  May give Tylenol or Motrin every 6 hours as needed for additional comfort  You may follow-up at urgent care as needed      ED Prescriptions     Medication Sig Dispense Auth. Provider   cefdinir (OMNICEF) 300 MG capsule Take 1 capsule (300 mg total) by mouth 2 (two) times daily for 10 days. 20 capsule Valinda Hoar, NP      PDMP not reviewed this encounter.   Valinda Hoar, NP 07/09/23 1055

## 2023-07-09 NOTE — ED Triage Notes (Signed)
Sore throat starting yesterday. Reports some chills and joint aches. Reports her child had strep throat last week and was on amoxicillin for it

## 2023-07-17 ENCOUNTER — Ambulatory Visit (INDEPENDENT_AMBULATORY_CARE_PROVIDER_SITE_OTHER): Payer: No Typology Code available for payment source | Admitting: Clinical

## 2023-07-17 DIAGNOSIS — F330 Major depressive disorder, recurrent, mild: Secondary | ICD-10-CM | POA: Diagnosis not present

## 2023-07-17 DIAGNOSIS — F422 Mixed obsessional thoughts and acts: Secondary | ICD-10-CM

## 2023-07-17 DIAGNOSIS — F411 Generalized anxiety disorder: Secondary | ICD-10-CM

## 2023-07-17 NOTE — Progress Notes (Signed)
[facility] Counselor/Therapist Progress Note  Patient ID: Stacey Houston, MRN: [id number]    Date: 07/17/23  Time Spent: 9:33  am - 10:30 am : 57 Minutes  Treatment Type: Individual Therapy.  Reported Symptoms: Patient reported obsessive thoughts and compulsions.   Mental Status Exam: Appearance:  Neat and Well Groomed     Behavior: Appropriate  Motor: Normal  Speech/Language:  Clear and Coherent  Affect: Appropriate  Mood: normal  Thought process: normal  Thought content:   Obsessions  Sensory/Perceptual disturbances:   WNL  Orientation: oriented to person, place, and situation  Attention: Good  Concentration: Good  Memory: WNL  Fund of knowledge:  Good  Insight:   Good  Judgment:  Good  Impulse Control: Fair   Risk Assessment: Danger to Self:  No Patient denied current suicidal ideation  Self-injurious Behavior: No Danger to Others: No Patient denied current homicidal ideation Duty to Warn:no Physical Aggression / Violence:No  Access to Firearms a concern: No  Gang Involvement:No   Subjective:  Patient stated, "pretty good" in response to events since last session. Patient reported a recent appointment with her psychiatrist and reported during the appointment patient's psychiatrist increased her dosage of Prozac to 20 mg once daily.  Patient stated, "about the same" in response to symptoms since last session. Patient stated, "I feel like it's good today" in response to patient's mood today. Patient reported she feels she experienced symptoms of OCD prior to the birth of her first child and reported symptoms increased with the birth of her first child. Patient stated, "Im good with that" in response to participation in therapy. Patient stated, "I would like to be able to handle my OCD better" and "manage the thoughts". Patient stated, "I would like to be able to just enjoy being", "my mind is always working", "be able to handle worry better" in response to  potential goals for therapy. Patient reported feeling she can relax at work due to not having other tasks to accomplish once her work is done. Patient reported she likes to start a task and finish the task. Patient reported she feels symptoms of anxiety and OCD increased while her home was being remodeled and reported there are still some tasks that are not completed. Patient reported feeling she did not have control during the remodel.   Interventions: Motivational Interviewing. Clinician conducted session in person at clinician's office at [facility]. Reviewed events since last session. Discussed patient's recent appointment with psychiatrist and changes in medication dosage. Assessed patient's mood since last session and patient's current mood. Clinician reviewed diagnoses and treatment recommendations. Provided psycho education related to diagnoses and treatment. Clinician utilized motivational interviewing to explore potential goals for therapy.   Diagnosis:  Mixed obsessional thoughts and acts  Generalized anxiety disorder  Mild episode of recurrent major depressive disorder (HCC)   Plan: Goals to be finalized during follow up appointment on 07/31/23.                     Doree Barthel, LCSW

## 2023-07-31 ENCOUNTER — Ambulatory Visit (INDEPENDENT_AMBULATORY_CARE_PROVIDER_SITE_OTHER): Payer: No Typology Code available for payment source | Admitting: Clinical

## 2023-07-31 DIAGNOSIS — F330 Major depressive disorder, recurrent, mild: Secondary | ICD-10-CM

## 2023-07-31 DIAGNOSIS — F411 Generalized anxiety disorder: Secondary | ICD-10-CM | POA: Diagnosis not present

## 2023-07-31 DIAGNOSIS — F422 Mixed obsessional thoughts and acts: Secondary | ICD-10-CM | POA: Diagnosis not present

## 2023-07-31 NOTE — Progress Notes (Signed)
[facility] Counselor/Therapist Progress Note  Patient ID: Stacey Houston, MRN: [id number]    Date: 07/31/23  Time Spent: 2:37  pm - 3:22 pm : 45 Minutes  Treatment Type: Individual Therapy.  Reported Symptoms: Patient reported obsessive thoughts and compulsions  Mental Status Exam: Appearance:  Neat and Well Groomed     Behavior: Appropriate  Motor: Normal  Speech/Language:  Clear and Coherent  Affect: Appropriate  Mood: normal  Thought process: normal  Thought content:   WNL  Sensory/Perceptual disturbances:   WNL  Orientation: oriented to person, place, and situation  Attention: Good  Concentration: Good  Memory: WNL  Fund of knowledge:  Good  Insight:   Good  Judgment:  Good  Impulse Control: Fair   Risk Assessment: Danger to Self:  No Patient denied current suicidal ideation  Self-injurious Behavior: No Danger to Others: No Patient denied current homicidal ideation Duty to Warn:no Physical Aggression / Violence:No  Access to Firearms a concern: No  Gang Involvement:No   Subjective:  Patient stated, "its pretty much the same" in response to events since last session. Patient stated, "its been good" in response to patient's mood since last session.  Patient reported periods of increased happiness since last session and reported periods of happiness last for 5 minutes at a time. Patient stated, "I'm not down", "I don't feel hopeless, "most of the time I would say Im happy with life in general". Patient reported she questions periods of increased happiness. Patient stated, "I feel like my mood is good today". Patient reported feeling she has to clean her kitchen immediately after meals, pick up the children's toys immediately, and take clothes out of the dryer immediately when drying cycle is complete.   Interventions: Motivational Interviewing. Clinician conducted session in person at clinician's office at [facility]. Reviewed events since last  session. Assessed patient's mood since last session and patient's current mood. Clinician utilized motivational interviewing to explore potential goals for therapy. Clinician utilized a task centered approach in collaboration with patient to develop goals for therapy. Patient participated in development of goals and agreed to goals for therapy. Clinician requested for homework patient complete a thought record.   Collaboration of Care: Other not required at this time  Diagnosis:  Mixed obsessional thoughts and acts  Generalized anxiety disorder  Mild episode of recurrent major depressive disorder (HCC)   Plan: Patient is to utilize [employer] [facility], thought re-framing, relaxation techniques, mindfulness and coping strategies to decrease symptoms associated with their diagnosis. Frequency: weekly  Modality: individual     Long-term goal:   Reduce overall level, frequency, and intensity of the feelings of depression and anxiety as evidenced by decrease obsessive thoughts, compulsions, worry, depressed mood from 6 to 7 days/week to 0 to 1 days/week per patient report for at least 3 consecutive months. Target Date: 07/30/24  Progress: 0   Short-term goal:  Verbalize an understanding of the role that distorted thinking plays in creating fears, excessive worry, and ruminations. Target Date: 01/31/24  Progress: 0   Reduce time spent completing compulsions from 1 hour per day during the week and 4 hours per day on the weekends Target Date: 01/31/24  Progress: 0   Reduce impulse to immediately complete compulsions, such as, cleaning kitchen after meals, taking clothes out of dryer once dryer is complete, and picking up children's toys Target Date: 01/31/24  Progress: 0   Develop and implement mindfulness exercises to increase patient's focus on being in the present  Target Date: 01/31/24  Progress: 0   Identify, challenge, and replace negative thought patterns that contribute to  feelings of depression, anxiety, and obsessive thoughts with positive thoughts and beliefs per patient's report Target Date: 01/31/24  Progress: 0                      Doree Barthel, LCSW

## 2023-08-04 ENCOUNTER — Encounter (HOSPITAL_COMMUNITY): Payer: Self-pay | Admitting: Psychiatry

## 2023-08-04 ENCOUNTER — Telehealth (INDEPENDENT_AMBULATORY_CARE_PROVIDER_SITE_OTHER): Payer: No Typology Code available for payment source | Admitting: Psychiatry

## 2023-08-04 DIAGNOSIS — F330 Major depressive disorder, recurrent, mild: Secondary | ICD-10-CM | POA: Diagnosis not present

## 2023-08-04 DIAGNOSIS — F411 Generalized anxiety disorder: Secondary | ICD-10-CM | POA: Diagnosis not present

## 2023-08-04 DIAGNOSIS — F422 Mixed obsessional thoughts and acts: Secondary | ICD-10-CM

## 2023-08-04 MED ORDER — FLUOXETINE HCL 40 MG PO CAPS: 40.0000 mg | ORAL_CAPSULE | Freq: Every day | ORAL | 1 refills | Status: DC

## 2023-08-04 NOTE — Patient Instructions (Signed)
We increased the Prozac to 40 mg once daily today.  Continue to monitor for signs of serotonin syndrome including flushing, palpitations, tremulousness, sweating.  I coordinated with your PCP to get a referral for nutrition and neurology.

## 2023-08-04 NOTE — Progress Notes (Signed)
[facility] MD Outpatient Progress Note  08/04/2023 8:30 AM Stacey Houston  MRN:  [id number]  Assessment:  [name] presents for follow-up evaluation. Today, 08/04/23, patient reports holding steady with regard to OCD symptoms as she is still cleaning a fair amount of the time.  She was amenable to titration of Prozac today other address OCD and we will continue to monitor for signs of serotonin syndrome while she is still on 10 mg of amitriptyline.  On that note, has noticed headaches are occurring roughly weekly since coming off the Topamax but that her depression has improved with discontinuation.  Will coordinate with PCP to get neurology referral.  Sleep did worsen slightly in the last week and her appetite has increased to include snacks along with her 2 meals per day but she has been worried about the cravings for sugar of started to take place.  She was amenable to referral to nutrition by PCP.  She is taking a vitamin B-12 supplement in addition to her multivitamin.  She continues in psychotherapy.  Follow-up in 1 month.   For safety, her acute risk factors for suicide are: Current diagnosis of depression, newborn in the home.  Her chronic risk factors for suicide are: Chronic mental health concerns, access to firearms.  Her protective factors are: Minor children living in the home, supportive family and friends, employment beloved pets, actively seeking and engaging with mental health care, no suicidal ideation, guns are secured in a gun safe.  While future events cannot be fully predicted she does not currently meet IVC criteria and can be continued as an outpatient.  Identifying Information: Stacey Houston is a 37 y.o. female with a history of OCD, generalized anxiety disorder, postpartum depression with worsening of OCD, recurrent major depressive disorder, chronic headaches who is an established patient with [facility] participating in follow-up via video  conferencing. Initial evaluation of OCD on 06/03/2023; please see that note for full case formulation.  Patient reported parents divorced at age 84 and with her mother leaving she does not have much of a relationship with her noting ongoing alcoholism.  Her sister also died around the same time as parents divorce and she saw psychotherapy briefly for this.  She denied a history of trauma but with circumstances of the above suspect this could have possible origin point for psychologic etiology of OCD.  Frequent headaches were also present throughout childhood and lasted into adulthood.  With her 2 pregnancies she had exacerbations of OCD and depression after delivery.  For her OCD, obsessions tend to ruminate on cleanliness which were followed by compulsions of cleaning which last for 2 hours every weekday and then an extended period of cleaning the entire house on Saturdays.  This was exacerbated after finding a bedbug in the home in March 2024 with experiencing extreme low mood for the first time without suicidal ideation.  Was not experiencing as much enjoyment in day-to-day activities so classified depression while improved as ongoing and mild.  Prozac had the best data for an SSRI for OCD which should address the depression and anxiety she experienced as well.  She took Topamax for her headaches and was eating roughly 2 meals per day.      Plan:   # OCD  generalized anxiety disorder Past medication trials:  Status of problem: Not improving as expected Interventions: -- Titrate Prozac to 40 mg once daily (s6/18/24, i7/16/24, i8/19/24) --Continue amitriptyline 10 mg nightly for now -- Continue psychotherapy   #  Mild recurrent major depressive disorder Past medication trials:  Status of problem: Improving Interventions: -- Prozac, amitriptyline, psychotherapy as above   # Chronic headaches Past medication trials: Topamax, amitriptyline Status of problem: Worsening Interventions: -- Continue  amitriptyline as above --Coordinate with PCP for neurology referral  Patient was given contact information for behavioral health clinic and was instructed to call 911 for emergencies.   Subjective:  Chief Complaint:  Chief Complaint  Patient presents with   Anxiety   OCD   Follow-up    Interval History: Things have been pretty good; more holding steady at this point. The cleaning is still taking many hours per day. The amitriptyline helps with the headaches but doesn't take it away in the same way topamax did. In the last week has started to wake up at 3a every morning and feels like she can go to sleep when it is time to get up. Still 2 meals per day but off of topamax appetite has increased which she doesn't like; snacking more. Noticing craving sugar.  She would be interested in nutrition referral.  Reviewed that topamax was making her more depressed/agitated after the first 3 weeks and since coming off the depressed component of her mood seems to have gotten a lot better.  She would be interested in neurology referral.  Has started with therapy who recommended she come weekly.  The plan titration of Prozac carefully reviewed symptoms of serotonin syndrome which she will continue to monitor for.  Visit Diagnosis:    ICD-10-CM   1. Mixed obsessional thoughts and acts  F42.2 FLUoxetine (PROZAC) 40 MG capsule    2. Generalized anxiety disorder  F41.1 FLUoxetine (PROZAC) 40 MG capsule    3. Mild episode of recurrent major depressive disorder (HCC)  F33.0 FLUoxetine (PROZAC) 40 MG capsule       Past Psychiatric History:  Diagnoses: OCD, generalized anxiety disorder, postpartum depression with worsening of OCD, recurrent major depressive disorder Medication trials: topamax (effective for headache), amitriptyline (ineffective for headache)  Previous psychiatrist/therapist: yes to therapy Hospitalizations: none Suicide attempts: none SIB: none Hx of violence towards others:  none Current access to guns: yes secured in gun safe Hx of trauma/abuse: none Substance use: none  Past Medical History:  Past Medical History:  Diagnosis Date   Anemia    Anxiety    Postpartum   Chronic pelvic pain in female    Constipation 01/10/2015   Depression    Postpartum   H/O chronic cystitis    History of recurrent UTIs    History of urethral stricture    Nephrolithiasis    bilateral non-obstructive per urologist note 06-16-2017  -- dr Marlou Porch   Normal labor 11/10/2018   Pregnancy 12/11/2021   Retained placenta 11/11/2018   Rib pain 12/26/2022    Past Surgical History:  Procedure Laterality Date   AUGMENTATION MAMMAPLASTY Bilateral 2009   CHROMOPERTUBATION N/A 08/12/2017   Procedure: CHROMOPERTUBATION;  Surgeon: Mitchel Honour, DO;  Location: [facility];  Service: Gynecology;  Laterality: N/A;   CYSTO/  URETHRAL DILATION  11/09/2004   DILATION AND EVACUATION N/A 11/11/2018   Procedure: DILATATION AND EVACUATION;  Surgeon: Zelphia Cairo, MD;  Location: [facility];  Service: Gynecology;  Laterality: N/A;   LAPAROSCOPY N/A 08/12/2017   Procedure: LAPAROSCOPY DIAGNOSTIC [place] OF ENDOMETRIOSI;  Surgeon: Mitchel Honour, DO;  Location: [facility];  Service: Gynecology;  Laterality: N/A;   WISDOM TOOTH EXTRACTION  03/2016    Family Psychiatric History: father history  of alcoholism, uncle died from alcoholism, mother alcoholism   Family History:  Family History  Problem Relation Age of Onset   Diabetes Maternal Grandmother    Diabetes Maternal Grandfather    Cancer Paternal Grandmother    Diabetes Paternal Grandmother    Cancer Paternal Grandfather    Diabetes Paternal Grandfather     Social History:  Academic/Vocational: Employed  Social History   Socioeconomic History   Marital status: Married    Spouse name: Not on file   Number of children: Not on file   Years of education: Not on file   Highest education  level: Not on file  Occupational History   Not on file  Tobacco Use   Smoking status: Never   Smokeless tobacco: Never  Vaping Use   Vaping status: Never Used  Substance and Sexual Activity   Alcohol use: Not Currently   Drug use: Never   Sexual activity: Yes    Birth control/protection: OCP  Other Topics Concern   Not on file  Social History Narrative   Not on file   Social Determinants of Health   Financial Resource Strain: Not on file  Food Insecurity: Not on file  Transportation Needs: Not on file  Physical Activity: Not on file  Stress: Not on file  Social Connections: Not on file    Allergies:  Allergies  Allergen Reactions   Amoxicillin Hives    Has patient had a PCN reaction causing immediate rash, facial/tongue/throat swelling, SOB or lightheadedness with hypotension: Yes Has patient had a PCN reaction causing severe rash involving mucus membranes or skin necrosis: Yes Has patient had a PCN reaction that required hospitalization: No Has patient had a PCN reaction occurring within the last 10 years: No If all of the above answers are "NO", then may proceed with Cephalosporin use.   Penicillins Hives    Has patient had a PCN reaction causing immediate rash, facial/tongue/throat swelling, SOB or lightheadedness with hypotension: Yes Has patient had a PCN reaction causing severe rash involving mucus membranes or skin necrosis: Yes Has patient had a PCN reaction that required hospitalization: No Has patient had a PCN reaction occurring within the last 10 years: No If all of the above answers are "NO", then may proceed with Cephalosporin use.    Sulfa Antibiotics Hives    Current Medications: Current Outpatient Medications  Medication Sig Dispense Refill   cyanocobalamin (VITAMIN B12) 250 MCG tablet Take 250 mcg by mouth daily.     amitriptyline (ELAVIL) 10 MG tablet TAKE 1 TABLET (10 MG TOTAL) BY MOUTH AT BEDTIME. FOR HEADACHE PREVENTION 90 tablet 1    FLUoxetine (PROZAC) 40 MG capsule Take 1 capsule (40 mg total) by mouth daily. 30 capsule 1   ibuprofen (ADVIL) 600 MG tablet Take 1 tablet (600 mg total) by mouth every 6 (six) hours as needed. 60 tablet 0   NIKKI 3-0.02 MG tablet Take 1 tablet by mouth daily.     Prenatal Vit-Fe Fumarate-FA (PRENATAL MULTIVITAMIN) TABS tablet Take 1 tablet by mouth daily at 12 noon. 60 tablet 1   No current facility-administered medications for this visit.    ROS: Review of Systems  Constitutional:  Positive for appetite change. Negative for unexpected weight change.  Endocrine: Positive for heat intolerance. Negative for cold intolerance and polyphagia.  Neurological:  Positive for headaches.  Psychiatric/Behavioral:  Negative for decreased concentration, dysphoric mood, hallucinations, self-injury, sleep disturbance and suicidal ideas. The patient is nervous/anxious.     Objective:  Psychiatric Specialty Exam: Last menstrual period 06/22/2023, not currently breastfeeding.There is no height or weight on file to calculate BMI.  General Appearance: Neat, Well Groomed, and appears stated age  Eye Contact:  Good  Speech:  Clear and Coherent and Normal Rate  Volume:  Normal  Mood:   "Things have been pretty good"  Affect:  Appropriate, Congruent, Constricted, and overall still bright but anxious  Thought Content: Logical, Hallucinations: None, and Rumination on cleanliness and sugar cravings  Suicidal Thoughts:  No  Homicidal Thoughts:  No  Thought Process:  Coherent, Goal Directed, and Linear  Orientation:  Full (Time, Place, and Person)    Memory:  Grossly intact   Judgment:  Good  Insight:  Good  Concentration:  Concentration: Good  Recall:  not formally assessed   Fund of Knowledge: Good  Language: Good  Psychomotor Activity:  Normal  Akathisia:  No  AIMS (if indicated): not done  Assets:  Communication Skills Desire for Improvement Financial  Resources/Insurance Housing Intimacy Leisure Time Physical Health Resilience Social Support Talents/Skills Transportation Vocational/Educational  ADL's:  Intact  Cognition: WNL  Sleep:  Fair   PE: General: sits comfortably in view of camera; no acute distress  Pulm: no increased work of breathing on room air  MSK: all extremity movements appear intact  Neuro: no focal neurological deficits observed  Gait & Station: unable to assess by video    Metabolic Disorder Labs: No results found for: "HGBA1C", "MPG" No results found for: "PROLACTIN" Lab Results  Component Value Date   CHOL 174 01/10/2015   TRIG 102.0 01/10/2015   HDL 55.60 01/10/2015   CHOLHDL 3 01/10/2015   VLDL 20.4 01/10/2015   LDLCALC 98 01/10/2015   Lab Results  Component Value Date   TSH 1.24 12/26/2022   TSH 1.23 01/10/2015    Therapeutic Level Labs: No results found for: "LITHIUM" No results found for: "VALPROATE" No results found for: "CBMZ"  Screenings:  GAD-7    Flowsheet Row Office Visit from [facility]/16/2023 in [facility]  Total GAD-7 Score 19      PHQ2-9    Flowsheet Row Office Visit from 06/03/2023 in [facility] [place] Office Visit from 04/16/2023 in [facility] [place] Office Visit from 12/26/2022 in [facility] [place]  PHQ-2 Total Score 2 6 0  PHQ-9 Total Score 6 15 --      Flowsheet Row ED from 07/09/2023 in [facility] Visit from 06/03/2023 in [facility] [place] Admission (Discharged) from 12/11/2021 in [place] 5S Mother Baby Unit  C-SSRS RISK CATEGORY No Risk No Risk No Risk       Collaboration of Care: Collaboration of Care: Medication Management AEB as above, Primary Care Provider AEB as above, and Referral or follow-up with counselor/therapist AEB as above  Patient/Guardian was advised Release  of Information must be obtained prior to any record release in order to collaborate their care with an outside provider. Patient/Guardian was advised if they have not already done so to contact the registration department to sign all necessary forms in order for [place] to release information regarding their care.   Consent: Patient/Guardian gives verbal consent for treatment and assignment of benefits for services provided during this visit. Patient/Guardian expressed understanding and agreed to proceed.   Televisit via video: I connected with patient on 08/04/23 at  8:00 AM EDT by  a video enabled telemedicine application and verified that I am speaking with the correct person using two identifiers.  Location: Patient: at home in [place] Provider: remote office in [place]   I discussed the limitations of evaluation and management by telemedicine and the availability of in person appointments. The patient expressed understanding and agreed to proceed.  I discussed the assessment and treatment plan with the patient. The patient was provided an opportunity to ask questions and all were answered. The patient agreed with the plan and demonstrated an understanding of the instructions.   The patient was advised to call back or seek an in-person evaluation if the symptoms worsen or if the condition fails to improve as anticipated.  I provided 20 minutes of virtual face-to-face time during this encounter.  Elsie Lincoln, MD 08/04/2023, 8:30 AM

## 2023-08-07 ENCOUNTER — Ambulatory Visit (INDEPENDENT_AMBULATORY_CARE_PROVIDER_SITE_OTHER): Payer: No Typology Code available for payment source | Admitting: Clinical

## 2023-08-07 DIAGNOSIS — F330 Major depressive disorder, recurrent, mild: Secondary | ICD-10-CM

## 2023-08-07 DIAGNOSIS — F411 Generalized anxiety disorder: Secondary | ICD-10-CM

## 2023-08-07 DIAGNOSIS — F422 Mixed obsessional thoughts and acts: Secondary | ICD-10-CM | POA: Diagnosis not present

## 2023-08-07 NOTE — Progress Notes (Signed)
                Karen Sharpe, LCSW 

## 2023-08-07 NOTE — Progress Notes (Signed)
[facility] Counselor/Therapist Progress Note  Patient ID: Stacey Houston, MRN: [id number],    Date: 08/07/2023  Time Spent: 1:38pm - 2:36pm : 58 minutes   Treatment Type: Individual Therapy  Reported Symptoms: Patient reported depressed mood  Mental Status Exam: Appearance:  Neat and Well Groomed     Behavior: Appropriate  Motor: Normal  Speech/Language:  Clear and Coherent  Affect: Appropriate  Mood: Patient reported "low" mood  Thought process: normal  Thought content:   WNL  Sensory/Perceptual disturbances:   WNL  Orientation: oriented to person, place, and situation  Attention: Good  Concentration: Good  Memory: WNL  Fund of knowledge:  Good  Insight:   Good  Judgment:  Good  Impulse Control: Fair   Risk Assessment: Danger to Self:  No Patient denied current suicidal ideation  Self-injurious Behavior: No Danger to Others: No Patient denied current homicidal ideation Duty to Warn:no Physical Aggression / Violence:No  Access to Firearms a concern: No  Gang Involvement:No   Subjective: Patient stated, "good" in response to events since last session.  Patient stated, "on Tuesday I was not in a really great mood and I don't know why". Patient reported she ordered new dining room chairs and had asked her husband to help patient put the chairs together. Patient reported she was upset that her husband did not help put the chairs together. Patient reported her husband left some bills on their dining room table and patient stated she "was aggravated about that". Patient reported when she was putting her daughter to bed on Tuesday she saw a gnat on her daughter's bed and stated, "it sent me into a panic". Patient reported her mood was "low" in response to recent events. Patient reported during a recent visit with her psychiatrist, the psychiatrist increased the dosage of patient's Prozac to 40 mg. Patient reported feeling "flustered" in response to her husband not  helping patient with the dining room chairs. Patient reported the papers on the dining room table made her mad and patient reported the thought,  "oh here we go this is what we always do" in response. Patient reported she has gained 10 lbs since she started taking amitriptyline and reported she feels this is a trigger for decline in mood. Patient reported "low" mood today but stated, "its not as bad as it was".   Interventions: Cognitive Behavioral Therapy.  Clinician conducted session in person at clinician's office at [facility]. Reviewed events since last session. Reviewed patient's thought record entries. Assisted patient in exploring and identifying thoughts/feelings triggered by recent situations. Discussed patient's recent appointment with the psychiatrist and changes in medication. Assisted patient in exploring additional triggers for depressed mood. Assessed patient's current mood. Provided psycho education related to automatic negative thoughts/cognitive distortions and cognitive restructuring. Clinician requested patient continue thought record and practice utilizing socratic questions to challenge negative thoughts/thought distortions.    Collaboration of Care: Other not required at this time   Diagnosis:  Mixed obsessional thoughts and acts   Generalized anxiety disorder   Mild episode of recurrent major depressive disorder (HCC)     Plan: Patient is to utilize [employer] Therapy, thought re-framing, relaxation techniques, mindfulness and coping strategies to decrease symptoms associated with their diagnosis. Frequency: weekly  Modality: individual      Long-term goal:   Reduce overall level, frequency, and intensity of the feelings of depression and anxiety as evidenced by decrease obsessive thoughts, compulsions, worry, depressed mood from 6 to 7 days/week to  0 to 1 days/week per patient report for at least 3 consecutive months. Target Date: 07/30/24  Progress:  0    Short-term goal:  Verbalize an understanding of the role that distorted thinking plays in creating fears, excessive worry, and ruminations. Target Date: 01/31/24  Progress: 0    Reduce time spent completing compulsions from 1 hour per day during the week and 4 hours per day on the weekends Target Date: 01/31/24  Progress: 0    Reduce impulse to immediately complete compulsions, such as, cleaning kitchen after meals, taking clothes out of dryer once dryer is complete, and picking up children's toys Target Date: 01/31/24  Progress: 0    Develop and implement mindfulness exercises to increase patient's focus on being in the present  Target Date: 01/31/24  Progress: 0    Identify, challenge, and replace negative thought patterns that contribute to feelings of depression, anxiety, and obsessive thoughts with positive thoughts and beliefs per patient's report Target Date: 01/31/24  Progress: 0                          Doree Barthel, LCSW

## 2023-08-12 ENCOUNTER — Ambulatory Visit (INDEPENDENT_AMBULATORY_CARE_PROVIDER_SITE_OTHER): Payer: No Typology Code available for payment source | Admitting: Clinical

## 2023-08-12 DIAGNOSIS — F422 Mixed obsessional thoughts and acts: Secondary | ICD-10-CM | POA: Diagnosis not present

## 2023-08-12 DIAGNOSIS — F411 Generalized anxiety disorder: Secondary | ICD-10-CM | POA: Diagnosis not present

## 2023-08-12 DIAGNOSIS — F330 Major depressive disorder, recurrent, mild: Secondary | ICD-10-CM | POA: Diagnosis not present

## 2023-08-12 NOTE — Progress Notes (Signed)
[facility] Counselor/Therapist Progress Note  Patient ID: Stacey Houston, MRN: [id number],    Date: 08/12/2023  Time Spent: 8:43am - 9:31am : 48 minutes   Treatment Type: Individual Therapy  Reported Symptoms: Patient reported obsessive thoughts and compulsions in response.   Mental Status Exam: Appearance:  Neat and Well Groomed     Behavior: Appropriate  Motor: Normal  Speech/Language:  Clear and Coherent  Affect: Appropriate  Mood: normal  Thought process: normal  Thought content:   WNL  Sensory/Perceptual disturbances:   WNL  Orientation: oriented to person, place, and situation  Attention: Good  Concentration: Good  Memory: WNL  Fund of knowledge:  Good  Insight:   Good  Judgment:  Good  Impulse Control: Good   Risk Assessment: Danger to Self:  No Patient denied current suicidal ideation  Self-injurious Behavior: No Danger to Others: No Patient denied current homicidal ideation Duty to Warn:no Physical Aggression / Violence:No  Access to Firearms a concern: No  Gang Involvement:No   Subjective: Patient stated, "I'm in a better mood this week". Patient reported she was able to obtain her barstools and put the barstools together. Patient stated, "good" in response to patient's mood this week. Patient reported her [place] the [place]. Patient reported her daughter is allergic to mosquitos and while at the lake she worried about her daughter being bitten by a mosquito. Patient reported when someone in her household is bitten by a bug patient's immediate thoughts are related to the fear of a bite from a bed bug. Patient reported she now equates bug bites with bed bugs. Patient reported her daughter was bitten by a mosquito and the bite triggered thoughts about bed bugs. Patient stated, "I think it's just the what if" and reported she feels not knowing the bed bug was present for 4 weeks triggers negative thoughts. Patient stated, "to me they  just seem terrible" and reported her thoughts of bed bugs have been impacted by patient's research on the internet. Patient stated, "Im just so worried there might be another one one day". Patient stated, "there's always a what if in all of my thoughts". Patient identified the thought "If I don't vacuum everyday under our bed then I feel like that's a place for a bug to hide" and during session patient practiced challenging this thought and others. Patient stated, "It meant I wasn't doing a good job" in response to finding a bed bug in her home.   Interventions: Cognitive Behavioral Therapy. Clinician conducted session in person at clinician's office at [facility]. Reviewed events since last session. Assessed patient's mood since last session and patient's current mood. Explored contributing factors to recent improvement in mood. Reviewed patient's thought record and responses to socratic questions. Assisted patient in exploring and identifying thoughts/feelings associated with bed bugs. Assisted patient in practicing utilizing socratic questions to challenge cognitive distortions during session. Clinician requested patient continue thought record and practice utilizing socratic questions to challenge negative thoughts/thought distortions.    Collaboration of Care: Other not required at this time   Diagnosis:  Mixed obsessional thoughts and acts   Generalized anxiety disorder   Mild episode of recurrent major depressive disorder (HCC)     Plan: Patient is to utilize [employer] Therapy, thought re-framing, relaxation techniques, mindfulness and coping strategies to decrease symptoms associated with their diagnosis. Frequency: weekly  Modality: individual      Long-term goal:   Reduce overall level, frequency, and intensity of the  feelings of depression and anxiety as evidenced by decrease obsessive thoughts, compulsions, worry, depressed mood from 6 to 7 days/week to 0 to 1  days/week per patient report for at least 3 consecutive months. Target Date: 07/30/24  Progress: progressing    Short-term goal:  Verbalize an understanding of the role that distorted thinking plays in creating fears, excessive worry, and ruminations. Target Date: 01/31/24  Progress: progressing    Reduce time spent completing compulsions from 1 hour per day during the week and 4 hours per day on the weekends Target Date: 01/31/24  Progress: progressing    Reduce impulse to immediately complete compulsions, such as, cleaning kitchen after meals, taking clothes out of dryer once dryer is complete, and picking up children's toys Target Date: 01/31/24  Progress: progressing    Develop and implement mindfulness exercises to increase patient's focus on being in the present  Target Date: 01/31/24  Progress: progressing    Identify, challenge, and replace negative thought patterns that contribute to feelings of depression, anxiety, and obsessive thoughts with positive thoughts and beliefs per patient's report Target Date: 01/31/24  Progress: progressing                      Doree Barthel, LCSW

## 2023-08-12 NOTE — Progress Notes (Signed)
                Karen Sharpe, LCSW 

## 2023-08-18 ENCOUNTER — Other Ambulatory Visit: Payer: Self-pay

## 2023-08-18 ENCOUNTER — Ambulatory Visit
Admission: EM | Admit: 2023-08-18 | Discharge: 2023-08-18 | Disposition: A | Discharge status: Home / Self Care | Payer: No Typology Code available for payment source | Attending: Emergency Medicine | Admitting: Emergency Medicine

## 2023-08-18 ENCOUNTER — Encounter: Payer: Self-pay | Admitting: Emergency Medicine

## 2023-08-18 DIAGNOSIS — J020 Streptococcal pharyngitis: Secondary | ICD-10-CM | POA: Diagnosis not present

## 2023-08-18 LAB — POCT RAPID STREP A (OFFICE): Rapid Strep A Screen: POSITIVE — AB

## 2023-08-18 MED ORDER — AZITHROMYCIN 250 MG PO TABS: 250.0000 mg | ORAL_TABLET | Freq: Every day | ORAL | 0 refills | Status: DC

## 2023-08-18 NOTE — ED Provider Notes (Signed)
Stacey Houston    CSN: [id number] Arrival date & time: 08/18/23  0801      History   Chief Complaint Chief Complaint  Patient presents with   Sore Throat    HPI Stacey Houston is a 37 y.o. female.  Patient presents with 3-day history of sore throat. She also reports 10 day history of nasal drainage and congestion.  No OTC medications taken today.  No fever, rash, cough, shortness of breath, or other symptoms.  Patient was seen at this urgent care on 07/09/2023 for strep pharyngitis and treated with cefdinir.   The history is provided by the patient and medical records.    Past Medical History:  Diagnosis Date   Anemia    Anxiety    Postpartum   Chronic pelvic pain in female    Constipation 01/10/2015   Depression    Postpartum   H/O chronic cystitis    History of recurrent UTIs    History of urethral stricture    Nephrolithiasis    bilateral non-obstructive per urologist note 06-16-2017  -- dr Marlou Porch   Normal labor 11/10/2018   Pregnancy 12/11/2021   Retained placenta 11/11/2018   Rib pain 12/26/2022    Patient Active Problem List   Diagnosis Date Noted   OCD (obsessive compulsive disorder) 06/03/2023   Generalized anxiety disorder 06/03/2023   Mild episode of recurrent major depressive disorder (HCC) 06/03/2023   Adjustment reaction with anxiety and depression 12/26/2022   Frequent headaches 12/26/2022   Abnormal thyroid blood test 12/26/2022    Past Surgical History:  Procedure Laterality Date   AUGMENTATION MAMMAPLASTY Bilateral 2009   CHROMOPERTUBATION N/A 08/12/2017   Procedure: CHROMOPERTUBATION;  Surgeon: Mitchel Honour, DO;  Location: [facility];  Service: Gynecology;  Laterality: N/A;   CYSTO/  URETHRAL DILATION  11/09/2004   DILATION AND EVACUATION N/A 11/11/2018   Procedure: DILATATION AND EVACUATION;  Surgeon: Zelphia Cairo, MD;  Location: [facility];  Service: Gynecology;  Laterality: N/A;   LAPAROSCOPY N/A  08/12/2017   Procedure: LAPAROSCOPY DIAGNOSTIC [place] OF ENDOMETRIOSI;  Surgeon: Mitchel Honour, DO;  Location: [facility];  Service: Gynecology;  Laterality: N/A;   WISDOM TOOTH EXTRACTION  03/2016    OB History     Gravida  2   Para  2   Term  2   Preterm      AB      Living  2      SAB      IAB      Ectopic      Multiple  0   Live Births  2            Home Medications    Prior to Admission medications   Medication Sig Start Date End Date Taking? Authorizing Provider  azithromycin (ZITHROMAX) 250 MG tablet Take 1 tablet (250 mg total) by mouth daily. Take first 2 tablets together, then 1 every day until finished. 08/18/23  Yes Mickie Bail, NP  cyanocobalamin (VITAMIN B12) 250 MCG tablet Take 250 mcg by mouth daily.    [provider]  FLUoxetine (PROZAC) 40 MG capsule Take 1 capsule (40 mg total) by mouth daily. 08/04/23   Elsie Lincoln, MD  ibuprofen (ADVIL) 600 MG tablet Take 1 tablet (600 mg total) by mouth every 6 (six) hours as needed. 12/12/21   Harold Hedge, MD  NIKKI 3-0.02 MG tablet Take 1 tablet by mouth daily. 11/18/22   [provider]  Prenatal Vit-Fe Fumarate-FA (PRENATAL MULTIVITAMIN) TABS tablet Take 1 tablet by mouth daily at 12 noon. 12/12/21   Harold Hedge, MD  topiramate (TOPAMAX) 50 MG tablet Take 50 mg by mouth at bedtime.    [provider]    Family History Family History  Problem Relation Age of Onset   Diabetes Maternal Grandmother    Diabetes Maternal Grandfather    Cancer Paternal Grandmother    Diabetes Paternal Grandmother    Cancer Paternal Grandfather    Diabetes Paternal Grandfather     Social History Social History   Tobacco Use   Smoking status: Never   Smokeless tobacco: Never  Vaping Use   Vaping status: Never Used  Substance Use Topics   Alcohol use: Not Currently   Drug use: Never     Allergies   Amoxicillin, Penicillins, and Sulfa  antibiotics   Review of Systems Review of Systems  Constitutional:  Negative for chills and fever.  HENT:  Positive for congestion, postnasal drip and sore throat. Negative for ear pain.   Respiratory:  Negative for cough and shortness of breath.   Cardiovascular:  Negative for chest pain and palpitations.  Gastrointestinal:  Negative for diarrhea and vomiting.  Skin:  Negative for color change and rash.     Physical Exam Triage Vital Signs ED Triage Vitals  Encounter Vitals Group     BP      Systolic BP Percentile      Diastolic BP Percentile      Pulse      Resp      Temp      Temp src      SpO2      Weight      Height      Head Circumference      Peak Flow      Pain Score      Pain Loc      Pain Education      Exclude from Growth Chart    No data found.  Updated Vital Signs BP 110/73   Pulse 80   Temp 99.1 F (37.3 C) (Oral)   Resp 14   Ht 5\' 4"  (1.626 m)   Wt 143 lb (64.9 kg)   LMP 05/20/2023 (Approximate)   SpO2 98%   BMI 24.55 kg/m   Visual Acuity Right Eye Distance:   Left Eye Distance:   Bilateral Distance:    Right Eye Near:   Left Eye Near:    Bilateral Near:     Physical Exam Constitutional:      General: She is not in acute distress. HENT:     Right Ear: Tympanic membrane normal.     Left Ear: Tympanic membrane normal.     Nose: Nose normal.     Mouth/Throat:     Mouth: Mucous membranes are moist.     Pharynx: Posterior oropharyngeal erythema present.  Cardiovascular:     Rate and Rhythm: Normal rate and regular rhythm.     Heart sounds: Normal heart sounds.  Pulmonary:     Effort: Pulmonary effort is normal. No respiratory distress.     Breath sounds: Normal breath sounds.  Skin:    General: Skin is warm and dry.  Neurological:     Mental Status: She is alert.  Psychiatric:        Mood and Affect: Mood normal.        Behavior: Behavior normal.      UC Treatments / Results  Labs (  all labs ordered are listed, but only  abnormal results are displayed) Labs Reviewed  POCT RAPID STREP A (OFFICE) - Abnormal; Notable for the following components:      Result Value   Rapid Strep A Screen Positive (*)    All other components within normal limits    EKG   Radiology No results found.  Procedures Procedures (including critical care time)  Medications Ordered in UC Medications - No data to display  Initial Impression / Assessment and Plan / UC Course  I have reviewed the triage vital signs and the nursing notes.  Pertinent labs & imaging results that were available during my care of the patient were reviewed by me and considered in my medical decision making (see chart for details).    Recurrent strep pharyngitis.  Afebrile and vital signs are stable.  Rapid strep positive.  Patient was recently treated with cefdinir.  Treating today with Zithromax.  Tylenol or ibuprofen as needed.  Instructed patient to follow-up with her PCP.  Education provided on strep throat.  She agrees to plan of care.  Final Clinical Impressions(s) / UC Diagnoses   Final diagnoses:  Recurrent streptococcal pharyngitis     Discharge Instructions      Take the Zithromax as directed.  Follow up with your primary care provider.        ED Prescriptions     Medication Sig Dispense Auth. Provider   azithromycin (ZITHROMAX) 250 MG tablet Take 1 tablet (250 mg total) by mouth daily. Take first 2 tablets together, then 1 every day until finished. 6 tablet Mickie Bail, NP      PDMP not reviewed this encounter.   Mickie Bail, NP 08/18/23 [phone]

## 2023-08-18 NOTE — Discharge Instructions (Addendum)
Take the Zithromax as directed.  Follow up with your primary care provider.

## 2023-08-18 NOTE — ED Triage Notes (Signed)
Pt started with congestion week and half ago and now has green nasal drainage.  Also c/o sore throat X3 days and odynophagia.  Strep been around daughters glad and a family member has sore throat "with blisters".  No known fever at home.

## 2023-08-19 ENCOUNTER — Ambulatory Visit: Payer: No Typology Code available for payment source | Admitting: Clinical

## 2023-08-26 ENCOUNTER — Ambulatory Visit (INDEPENDENT_AMBULATORY_CARE_PROVIDER_SITE_OTHER): Payer: No Typology Code available for payment source | Admitting: Clinical

## 2023-08-26 DIAGNOSIS — F330 Major depressive disorder, recurrent, mild: Secondary | ICD-10-CM | POA: Diagnosis not present

## 2023-08-26 DIAGNOSIS — F422 Mixed obsessional thoughts and acts: Secondary | ICD-10-CM | POA: Diagnosis not present

## 2023-08-26 DIAGNOSIS — F411 Generalized anxiety disorder: Secondary | ICD-10-CM | POA: Diagnosis not present

## 2023-08-26 NOTE — Progress Notes (Signed)
[facility] Counselor/Therapist Progress Note  Patient ID: Stacey Houston, MRN: [id number],    Date: 08/26/2023  Time Spent: 8:38am - 9:25am : 47 minutes   Treatment Type: Individual Therapy  Reported Symptoms: Patient reported experiencing worry and difficulty staying asleep.   Mental Status Exam: Appearance:  Neat and Well Groomed     Behavior: Appropriate  Motor: Normal  Speech/Language:  Clear and Coherent  Affect: Appropriate  Mood: normal  Thought process: normal  Thought content:   WNL  Sensory/Perceptual disturbances:   WNL  Orientation: oriented to person, place, and situation  Attention: Good  Concentration: Good  Memory: WNL  Fund of knowledge:  Good  Insight:   Good  Judgment:  Good  Impulse Control: Good   Risk Assessment: Danger to Self:  No Patient denied current suicidal ideation  Self-injurious Behavior: No Danger to Others: No Patient denied current homicidal ideation Duty to Warn:no Physical Aggression / Violence:No  Access to Firearms a concern: No  Gang Involvement:No   Subjective: Patient reported she had strep throat last week and had strep throat in July as well. Patient reported she has been experiencing headaches and is currently on Topamax for treatment of headaches. Patient reported she feels she has TMJ and is concerned the headaches may be from TMJ. Patient stated, "no wonder I'm never in a good mood because I don't feel well". Patient reported difficulty staying asleep, wakes up multiple times throughout the night, and reported she experiences worry about various topics after waking up in the middle of the night. Patient reported she worries about her sleep and how she is going to feel when she wakes up in the morning. Patient stated, "I think that would be helpful" in response to use of a worry journal. Patient reported she experiences vivid dreams and reported the dreams wake patient up at times. Patient stated, "It went good"  in response to patient's thought record. Patient reported she documented concern related to the possibility of a diagnosis of TMJ in her thought record. Patient stated, "I'm kind of always waiting for the other shoe to drop" and reported she started experiencing this thought after experiencing depression and anxiety. Patient stated, "its ok" in response to patient's mood today.   Interventions: Cognitive Behavioral Therapy. Clinician conducted session in person at clinician's office at [facility]. Reviewed events since last session. Assisted patient in exploring and identifying triggers for recent changes in mood. Provided psycho education related to triggers for changes in mood and provided psycho education related to sleep. Discussed difficulty patient is experiencing with sleep and explored barriers to sleep.  Discussed using a worry journal prior to patient going to sleep. Reviewed patient's thought record. Provided psycho education related to cognitive restructuring. Assessed patient's mood.  Clinician requested patient continue thought record and practice utilizing socratic questions to challenge negative thoughts/thought distortions. In addition, clinician requested patient practice utilizing worry journal for homework.    Collaboration of Care: Other not required at this time   Diagnosis:  Mixed obsessional thoughts and acts   Generalized anxiety disorder   Mild episode of recurrent major depressive disorder (HCC)     Plan: Patient is to utilize [employer], thought re-framing, relaxation techniques, mindfulness and coping strategies to decrease symptoms associated with their diagnosis. Frequency: weekly  Modality: individual      Long-term goal:   Reduce overall level, frequency, and intensity of the feelings of depression and anxiety as evidenced by decrease obsessive thoughts, compulsions,  worry, depressed mood from 6 to 7 days/week to 0 to 1 days/week per  patient report for at least 3 consecutive months. Target Date: 07/30/24  Progress: progressing    Short-term goal:  Verbalize an understanding of the role that distorted thinking plays in creating fears, excessive worry, and ruminations. Target Date: 01/31/24  Progress: progressing    Reduce time spent completing compulsions from 1 hour per day during the week and 4 hours per day on the weekends Target Date: 01/31/24  Progress: progressing    Reduce impulse to immediately complete compulsions, such as, cleaning kitchen after meals, taking clothes out of dryer once dryer is complete, and picking up children's toys Target Date: 01/31/24  Progress: progressing    Develop and implement mindfulness exercises to increase patient's focus on being in the present  Target Date: 01/31/24  Progress: progressing    Identify, challenge, and replace negative thought patterns that contribute to feelings of depression, anxiety, and obsessive thoughts with positive thoughts and beliefs per patient's report Target Date: 01/31/24  Progress: progressing                    Doree Barthel, LCSW

## 2023-08-26 NOTE — Progress Notes (Signed)
                Karen Sharpe, LCSW 

## 2023-08-27 ENCOUNTER — Other Ambulatory Visit (HOSPITAL_COMMUNITY): Payer: Self-pay | Admitting: Psychiatry

## 2023-08-27 DIAGNOSIS — F330 Major depressive disorder, recurrent, mild: Secondary | ICD-10-CM

## 2023-08-27 DIAGNOSIS — F422 Mixed obsessional thoughts and acts: Secondary | ICD-10-CM

## 2023-08-27 DIAGNOSIS — F411 Generalized anxiety disorder: Secondary | ICD-10-CM

## 2023-08-28 ENCOUNTER — Ambulatory Visit (INDEPENDENT_AMBULATORY_CARE_PROVIDER_SITE_OTHER): Payer: No Typology Code available for payment source | Admitting: Primary Care

## 2023-08-28 ENCOUNTER — Encounter: Payer: Self-pay | Admitting: Primary Care

## 2023-08-28 ENCOUNTER — Encounter: Payer: Self-pay | Admitting: *Deleted

## 2023-08-28 VITALS — BP 112/76 | HR 77 | Temp 97.7°F | Ht 64.0 in | Wt 145.0 lb

## 2023-08-28 DIAGNOSIS — G8929 Other chronic pain: Secondary | ICD-10-CM | POA: Diagnosis not present

## 2023-08-28 DIAGNOSIS — R519 Headache, unspecified: Secondary | ICD-10-CM | POA: Diagnosis not present

## 2023-08-28 DIAGNOSIS — Z8709 Personal history of other diseases of the respiratory system: Secondary | ICD-10-CM | POA: Diagnosis not present

## 2023-08-28 DIAGNOSIS — M26629 Arthralgia of temporomandibular joint, unspecified side: Secondary | ICD-10-CM

## 2023-08-28 HISTORY — DX: Personal history of other diseases of the respiratory system: Z87.09

## 2023-08-28 LAB — POCT RAPID STREP A (OFFICE): Rapid Strep A Screen: NEGATIVE

## 2023-08-28 NOTE — Assessment & Plan Note (Signed)
Exam today not suggestive of strep pharyngitis. Rapid strep today negative.  Consider ENT referral if she continues to experience episodes of strep pharyngitis.

## 2023-08-28 NOTE — Assessment & Plan Note (Signed)
Topiramate no longer effective. Continue topiramate 50 mg daily for now.  Referral placed to neurology for further evaluation.

## 2023-08-28 NOTE — Progress Notes (Signed)
Subjective:    Patient ID: Stacey Houston, female    DOB: 01/10/1986, 37 y.o.   MRN: [id number]  HPI  Stacey Houston is a very pleasant 37 y.o. female with a history of strep pharyngitis who presents today for urgent care follow up and for headaches.  1) Strep Pharyngitis: She presented to urgent care on 08/18/2023 for 3-day history of sore throat and a 10-day history of nasal drainage/congestion.  Previously treated at urgent care on 07/09/2023 for strep pharyngitis with cefdinir.  During her recent urgent care visit her rapid strep test was positive.  She was treated with azithromycin course and advised to take Tylenol/ibuprofen as needed.  Since her Urgent Care visit she's feeling better. She denies sore throat, fevers. She has mild congestion and drainage. She completed her course of azithromycin. She has no prior history of strep pharyngitis, even as a child.   She was told to come back to our office to be re-swabbed.  It was thought that her strep pharyngitis from July 2024 was never resolved.  2) Frequent Headaches: Currently managed on Topamax which was re initiated a few weeks ago. She's had a daily headache nearly everyday of the week since initiation. When on Topamax previously she hardly had headaches.   She suspects that her headaches are coming from her TMJ. She chronically clenches her teeth at night. She experiences jaw pain with smiling, laughing, and other facial expressions. Her mother underwent TMJ surgery in her 30s.  She has tried to wear mouth guards at night but ends up spitting them out during her sleep.  She recently saw her dentist who recommended treatment.  Her dental insurance will not cover TMJ treatment, however, her medical insurance will.  She would like to be seen by neurology.  Review of Systems  Constitutional:  Negative for fever.  HENT:  Positive for congestion and postnasal drip. Negative for sore throat.        TMJ pain bilaterally  Respiratory:   Negative for cough.   Neurological:  Positive for headaches.         Past Medical History:  Diagnosis Date   Anemia    Anxiety    Postpartum   Chronic pelvic pain in female    Constipation 01/10/2015   Depression    Postpartum   H/O chronic cystitis    History of recurrent UTIs    History of urethral stricture    Nephrolithiasis    bilateral non-obstructive per urologist note 06-16-2017  -- dr Marlou Porch   Normal labor 11/10/2018   Pregnancy 12/11/2021   Retained placenta 11/11/2018   Rib pain 12/26/2022    Social History   Socioeconomic History   Marital status: Married    Spouse name: Not on file   Number of children: Not on file   Years of education: Not on file   Highest education level: Not on file  Occupational History   Not on file  Tobacco Use   Smoking status: Never   Smokeless tobacco: Never  Vaping Use   Vaping status: Never Used  Substance and Sexual Activity   Alcohol use: Not Currently   Drug use: Never   Sexual activity: Yes    Birth control/protection: OCP  Other Topics Concern   Not on file  Social History Narrative   Not on file   Social Determinants of Health   Financial Resource Strain: Not on file  Food Insecurity: Not on file  Transportation Needs: Not on file  Physical Activity: Not on file  Stress: Not on file  Social Connections: Not on file  Intimate Partner Violence: Not on file    Past Surgical History:  Procedure Laterality Date   AUGMENTATION MAMMAPLASTY Bilateral 2009   CHROMOPERTUBATION N/A 08/12/2017   Procedure: CHROMOPERTUBATION;  Surgeon: Mitchel Honour, DO;  Location: [facility];  Service: Gynecology;  Laterality: N/A;   CYSTO/  URETHRAL DILATION  11/09/2004   DILATION AND EVACUATION N/A 11/11/2018   Procedure: DILATATION AND EVACUATION;  Surgeon: Zelphia Cairo, MD;  Location: [facility];  Service: Gynecology;  Laterality: N/A;   LAPAROSCOPY N/A 08/12/2017   Procedure: LAPAROSCOPY  DIAGNOSTIC [place] OF ENDOMETRIOSI;  Surgeon: Mitchel Honour, DO;  Location: [facility];  Service: Gynecology;  Laterality: N/A;   WISDOM TOOTH EXTRACTION  03/2016    Family History  Problem Relation Age of Onset   Diabetes Maternal Grandmother    Diabetes Maternal Grandfather    Cancer Paternal Grandmother    Diabetes Paternal Grandmother    Cancer Paternal Grandfather    Diabetes Paternal Grandfather     Allergies  Allergen Reactions   Amoxicillin Hives    Has patient had a PCN reaction causing immediate rash, facial/tongue/throat swelling, SOB or lightheadedness with hypotension: Yes Has patient had a PCN reaction causing severe rash involving mucus membranes or skin necrosis: Yes Has patient had a PCN reaction that required hospitalization: No Has patient had a PCN reaction occurring within the last 10 years: No If all of the above answers are "NO", then may proceed with Cephalosporin use.   Penicillins Hives    Has patient had a PCN reaction causing immediate rash, facial/tongue/throat swelling, SOB or lightheadedness with hypotension: Yes Has patient had a PCN reaction causing severe rash involving mucus membranes or skin necrosis: Yes Has patient had a PCN reaction that required hospitalization: No Has patient had a PCN reaction occurring within the last 10 years: No If all of the above answers are "NO", then may proceed with Cephalosporin use.    Sulfa Antibiotics Hives    Current Outpatient Medications on File Prior to Visit  Medication Sig Dispense Refill   cyanocobalamin (VITAMIN B12) 250 MCG tablet Take 250 mcg by mouth daily.     FLUoxetine (PROZAC) 40 MG capsule Take 1 capsule (40 mg total) by mouth daily. 30 capsule 1   ibuprofen (ADVIL) 600 MG tablet Take 1 tablet (600 mg total) by mouth every 6 (six) hours as needed. 60 tablet 0   NIKKI 3-0.02 MG tablet Take 1 tablet by mouth daily.     Prenatal Vit-Fe Fumarate-FA (PRENATAL MULTIVITAMIN) TABS  tablet Take 1 tablet by mouth daily at 12 noon. 60 tablet 1   topiramate (TOPAMAX) 50 MG tablet Take 50 mg by mouth at bedtime.     No current facility-administered medications on file prior to visit.    BP 112/76   Pulse 77   Temp 97.7 F (36.5 C) (Temporal)   Ht 5\' 4"  (1.626 m)   Wt 145 lb (65.8 kg)   LMP 05/20/2023 (Approximate)   SpO2 98%   BMI 24.89 kg/m  Objective:   Physical Exam HENT:     Head:     Jaw: Pain on movement present.     Mouth/Throat:     Mouth: Mucous membranes are moist.     Pharynx: Posterior oropharyngeal erythema present. No pharyngeal swelling or oropharyngeal exudate.  Cardiovascular:     Rate and Rhythm: Normal rate and regular  rhythm.  Pulmonary:     Effort: Pulmonary effort is normal.     Breath sounds: Normal breath sounds.  Skin:    General: Skin is warm and dry.  Psychiatric:        Mood and Affect: Mood normal.           Assessment & Plan:  Frequent headaches Assessment & Plan: Topiramate no longer effective. Continue topiramate 50 mg daily for now.  Referral placed to neurology for further evaluation.   Orders: -     Ambulatory referral to Neurology  Chronic TMJ pain Assessment & Plan: Referral placed to neurology for further evaluation.  Orders: -     Ambulatory referral to Neurology  History of strep pharyngitis Assessment & Plan: Exam today not suggestive of strep pharyngitis. Rapid strep today negative.  Consider ENT referral if she continues to experience episodes of strep pharyngitis.   Orders: -     POCT rapid strep A        Doreene Nest, NP

## 2023-08-28 NOTE — Patient Instructions (Signed)
You will either be contacted via phone regarding your referral to neurology, or you may receive a letter on your MyChart portal from our referral team with instructions for scheduling an appointment. Please let us know if you have not been contacted by anyone within two weeks.  Continue topiramate 50 mg at bedtime for now.  It was a pleasure to see you today!

## 2023-08-28 NOTE — Assessment & Plan Note (Signed)
Referral placed to neurology for further evaluation.

## 2023-09-03 ENCOUNTER — Ambulatory Visit: Payer: No Typology Code available for payment source | Admitting: Clinical

## 2023-09-03 DIAGNOSIS — F330 Major depressive disorder, recurrent, mild: Secondary | ICD-10-CM

## 2023-09-03 DIAGNOSIS — F411 Generalized anxiety disorder: Secondary | ICD-10-CM | POA: Diagnosis not present

## 2023-09-03 DIAGNOSIS — F422 Mixed obsessional thoughts and acts: Secondary | ICD-10-CM | POA: Diagnosis not present

## 2023-09-03 NOTE — Progress Notes (Signed)
                Dezi Schaner, LCSW 

## 2023-09-03 NOTE — Progress Notes (Signed)
[facility] Counselor/Therapist Progress Note  Patient ID: Stacey Houston, MRN: [id number],    Date: 09/03/2023  Time Spent: 8:35am - 9:31am : 56 minutes   Treatment Type: Individual Therapy  Reported Symptoms: Patient reported difficulty staying asleep  Mental Status Exam: Appearance:  Neat and Well Groomed     Behavior: Appropriate  Motor: Normal  Speech/Language:  Clear and Coherent  Affect: Appropriate  Mood: normal  Thought process: normal  Thought content:   WNL  Sensory/Perceptual disturbances:   WNL  Orientation: oriented to person, place, and situation  Attention: Good  Concentration: Good  Memory: WNL  Fund of knowledge:  Good  Insight:   Good  Judgment:  Good  Impulse Control: Good   Risk Assessment: Danger to Self:  No Patient denied current suicidal ideation  Self-injurious Behavior: No Danger to Others: No Patient denied current homicidal ideation Duty to Warn:no Physical Aggression / Violence:No  Access to Firearms a concern: No  Gang Involvement:No   Subjective: Patient stated, "fine" in response to events since last session. Patient reported patient's PCP referred patient to neurology to address patient's concerns/symptoms related to TMJ. Patient reported she has an upcoming appointment with a neurologist. Patient reported she discontinued Topamax and stated, "I think I'm in a better mood" in response to discontinuing the medication. Patient reported she did not feel Topamax was improving patient's headaches.  Patient reported she continues to experience difficulty staying asleep. Patient stated, "the worry journal I do feel like helps" and stated, "I know its helping me feel better before I go to sleep".  Patient reported she plans to discuss difficulty staying asleep with patient's psychiatrist. Patient reported she drinks water throughout the day, coffee in the morning, and reported no caffeine intake after 10:30am. Patient reported she  wakes up at least once a night to use the restroom. Patient reported she goes to bed around 9:45pm and wakes up at 6:05am. Patient reported she goes to bed and wakes up a little later on the weekends. Patient reported yawning during the day and reported she could fall asleep easily during the day. Patient reported she has not been able to exercise as often as she did before patient had children. Patient reported she has been utilizing a sleep mask and finds the mask beneficial. Patient stated, "I feel like its been pretty good" in response to patient's mood since last session. Patient reported she noted in her thought record reoccurring thoughts related to construction projects in the house that need to be completed and reported multiple emotions in response. During session, patient reviewed patient's responses to socratic questions. Patient stated, "I think its very helpful" in response to use of socratic questions.   Interventions: Cognitive Behavioral Therapy. Clinician conducted session in person at clinician's office at [facility]. Reviewed events since last session. Reviewed patient's implementation of a worry journal and the outcome. Discussed difficulty patient is experiencing staying asleep and assessed patient's sleep hygiene. Provided psycho education related to healthy sleep hygiene. Explored strategies to increase patient's physical activity. Assessed patient's mood since last session and assessed patient's current mood. Reviewed patient's thought record, patient's implementation of socratic questions, and the outcome. Clinician requested patient continue thought record, practice utilizing socratic questions to challenge negative thoughts/thought distortions, and maintain worry journal for homework.    Collaboration of Care: Other not required at this time   Diagnosis:  Mixed obsessional thoughts and acts   Generalized anxiety disorder   Mild episode of recurrent major  depressive  disorder (HCC)     Plan: Patient is to utilize [employer], thought re-framing, relaxation techniques, mindfulness and coping strategies to decrease symptoms associated with their diagnosis. Frequency: weekly  Modality: individual      Long-term goal:   Reduce overall level, frequency, and intensity of the feelings of depression and anxiety as evidenced by decrease obsessive thoughts, compulsions, worry, depressed mood from 6 to 7 days/week to 0 to 1 days/week per patient report for at least 3 consecutive months. Target Date: 07/30/24  Progress: progressing    Short-term goal:  Verbalize an understanding of the role that distorted thinking plays in creating fears, excessive worry, and ruminations. Target Date: 01/31/24  Progress: progressing    Reduce time spent completing compulsions from 1 hour per day during the week and 4 hours per day on the weekends Target Date: 01/31/24  Progress: progressing    Reduce impulse to immediately complete compulsions, such as, cleaning kitchen after meals, taking clothes out of dryer once dryer is complete, and picking up children's toys Target Date: 01/31/24  Progress: progressing    Develop and implement mindfulness exercises to increase patient's focus on being in the present  Target Date: 01/31/24  Progress: progressing    Identify, challenge, and replace negative thought patterns that contribute to feelings of depression, anxiety, and obsessive thoughts with positive thoughts and beliefs per patient's report Target Date: 01/31/24  Progress: progressing                Doree Barthel, LCSW

## 2023-09-09 ENCOUNTER — Telehealth (HOSPITAL_COMMUNITY): Payer: No Typology Code available for payment source | Admitting: Psychiatry

## 2023-09-10 ENCOUNTER — Ambulatory Visit: Payer: No Typology Code available for payment source | Admitting: Clinical

## 2023-09-10 DIAGNOSIS — F330 Major depressive disorder, recurrent, mild: Secondary | ICD-10-CM

## 2023-09-10 DIAGNOSIS — F411 Generalized anxiety disorder: Secondary | ICD-10-CM | POA: Diagnosis not present

## 2023-09-10 DIAGNOSIS — F422 Mixed obsessional thoughts and acts: Secondary | ICD-10-CM | POA: Diagnosis not present

## 2023-09-10 NOTE — Progress Notes (Signed)
[facility] Counselor/Therapist Progress Note  Patient ID: Stacey Houston, MRN: [id number],    Date: 09/10/2023  Time Spent: 8:35am - 9:05am : 30 minutes   Treatment Type: Individual Therapy  Reported Symptoms: Patient reported a recent decrease in compulsions.   Mental Status Exam: Appearance:  Neat and Well Groomed     Behavior: Appropriate  Motor: Normal  Speech/Language:  Clear and Coherent  Affect: Appropriate  Mood: normal  Thought process: normal  Thought content:   WNL  Sensory/Perceptual disturbances:   WNL  Orientation: oriented to person, place, and situation  Attention: Good  Concentration: Good  Memory: WNL  Fund of knowledge:  Good  Insight:   Good  Judgment:  Good  Impulse Control: Good   Risk Assessment: Danger to Self:  No Patient denied current suicidal ideation  Self-injurious Behavior: No Danger to Others: No Patient denied current homicidal ideation Duty to Warn:no Physical Aggression / Violence:No  Access to Firearms a concern: No  Gang Involvement:No   Subjective: Patient stated, "probably about the same, it's good" in response to patient's mood since last session. Patient stated, "it makes me feel down" in reference to Topamax and reported she has discontinued taking Topamax. Patient stated,  "mainly what's bothering me most now, its still cleaning". Patient reported for two consecutive days patient did not vacuum the entire house. Patient reported she allowed her children to play under the dining room table and reported allowing her children to play under the dining room table is uncommon. Patient reported she recently had guests over for a meal and reported in the past having guests in her home would have been a stressor. Patient reported she continues to worry about getting patient's weekly cleaning completed prior to Saturday morning. Patient reported the list of home repairs that are not complete is upsetting patient the most.  Patient reported she feels her husband contributes patient's concerns related to home repairs that are incomplete to diagnosis of OCD. Patient reported she avoids conflict and is fearful of husband dismissing patient's thoughts/feelings/concerns if patient initiates a conversation to discuss patient's thoughts/feelings/concerns. Patient reported husband dismissing patient's concerns triggers feelings of sadness, anger, and feeling helpless. Patient reported she feels her concerns are dismissed frequently.   Patient requested a shorter session today due to an appointment conflict.   Interventions: Cognitive Behavioral Therapy and Interpersonal. Clinician conducted session in person at clinician's office at [facility]. Assessed patient's mood since last session and assessed patient's current mood. Reviewed patient's thought record entries. Assisted patient in discussing and identifying thoughts/feelings triggered by incomplete home repairs and communication with patient's husband regarding home repairs. Provided psycho education related to the use of "I" statements and discussed additional communication strategies for patient to utilize when communicating with husband. Clinician requested patient continue thought record, practice utilizing socratic questions to challenge negative thoughts/thought distortions, and maintain worry journal for homework.     Collaboration of Care: Other not required at this time   Diagnosis:  Mixed obsessional thoughts and acts   Generalized anxiety disorder   Mild episode of recurrent major depressive disorder (HCC)     Plan: Patient is to utilize [employer], thought re-framing, relaxation techniques, mindfulness and coping strategies to decrease symptoms associated with their diagnosis. Frequency: weekly  Modality: individual      Long-term goal:   Reduce overall level, frequency, and intensity of the feelings of depression and anxiety as  evidenced by decrease obsessive thoughts, compulsions, worry, depressed mood from  6 to 7 days/week to 0 to 1 days/week per patient report for at least 3 consecutive months. Target Date: 07/30/24  Progress: progressing    Short-term goal:  Verbalize an understanding of the role that distorted thinking plays in creating fears, excessive worry, and ruminations. Target Date: 01/31/24  Progress: progressing    Reduce time spent completing compulsions from 1 hour per day during the week and 4 hours per day on the weekends Target Date: 01/31/24  Progress: progressing    Reduce impulse to immediately complete compulsions, such as, cleaning kitchen after meals, taking clothes out of dryer once dryer is complete, and picking up children's toys Target Date: 01/31/24  Progress: progressing    Develop and implement mindfulness exercises to increase patient's focus on being in the present  Target Date: 01/31/24  Progress: progressing    Identify, challenge, and replace negative thought patterns that contribute to feelings of depression, anxiety, and obsessive thoughts with positive thoughts and beliefs per patient's report Target Date: 01/31/24  Progress: progressing              Doree Barthel, LCSW

## 2023-09-10 NOTE — Progress Notes (Signed)
                Dezi Schaner, LCSW 

## 2023-09-17 ENCOUNTER — Ambulatory Visit: Payer: No Typology Code available for payment source | Admitting: Clinical

## 2023-09-17 DIAGNOSIS — F411 Generalized anxiety disorder: Secondary | ICD-10-CM

## 2023-09-17 DIAGNOSIS — F422 Mixed obsessional thoughts and acts: Secondary | ICD-10-CM | POA: Diagnosis not present

## 2023-09-17 DIAGNOSIS — F330 Major depressive disorder, recurrent, mild: Secondary | ICD-10-CM

## 2023-09-17 NOTE — Progress Notes (Signed)
[facility] Counselor/Therapist Progress Note  Patient ID: Stacey Houston, MRN: [id number],    Date: 09/17/2023  Time Spent: 8:37am - 9:35am : 58 minutes   Treatment Type: Individual Therapy  Reported Symptoms: Patient reported anxiety related to patient's weekly cleaning schedule  Mental Status Exam: Appearance:  Neat and Well Groomed     Behavior: Appropriate  Motor: Normal  Speech/Language:  Clear and Coherent  Affect: Appropriate  Mood: normal  Thought process: normal  Thought content:   WNL  Sensory/Perceptual disturbances:   WNL  Orientation: oriented to person, place, and situation  Attention: Good  Concentration: Good  Memory: WNL  Fund of knowledge:  Good  Insight:   Good  Judgment:  Good  Impulse Control: Good   Risk Assessment: Danger to Self:  No Patient denied current suicidal ideation  Self-injurious Behavior: No Danger to Others: No Patient denied current homicidal ideation Duty to Warn:no Physical Aggression / Violence:No  Access to Firearms a concern: No  Gang Involvement:No   Subjective: Patient stated, "good" in response to events since last session. Patient stated, "I would say my mood is probably the same since we last spoke". Patient reported she no longer feels she has to vacuum when she gets home from work each day. Patient reported there were two days since last session patient did not vacuum and patient stated, "it wasn't the end of the world".  Patient reported she only vacuumed under her bed 2 days last week. Patient reported completing patient's weekly cleaning continues to trigger anxiety. Patient reported she noted in her thought record feeling she does not have enough time to complete patient's weekly cleaning. Patient reported her husband stays with their children while patient cleans her home on Saturday mornings and patient stated, "I feel like I hold him (husband) up in the mornings". Patient reported last week patient's  husband initiated a conversation about husband's needs. Patient reported her response to the conversation was minimal and patient acknowledged during session patient's response was dismissive of husband's feelings. Patient identified the following barriers to communication with husband: feeling dismissed, does not want husband to become upset.  Patient reported the list of incomplete projects in her home is a trigger for anxiety and impacts patient's relationship with her husband. Patient reported she plans to start writing her thoughts/feelings in preparation for a conversation with her husband. Patient stated, "I think the critical thinking with these questions has helped me think about these things" in response to patient's use of socratic questions. Patient stated,  "I do like the worry journal".   Interventions: Cognitive Behavioral Therapy and Interpersonal. Clinician conducted session in person at clinician's office at [facility]. Reviewed events since last session. Assessed patient's mood since last session and assessed patient's current mood. Assessed frequency and intensity of compulsions. Reviewed patient's thought record entries. Assisted patient in discussing and identifying thoughts/feelings triggered by patient's weekly house cleaning. Challenged statements/thoughts and interpretations to help patient reframe events. Explored strategies to address patient's concerns related to child care on Saturday mornings. Provided psycho education related to healthy communication. Assisted patient in exploring and identifying barriers to communication with husband. Discussed patient writing down her thoughts/feelings and patient practicing communicating her thoughts/feelings to husband prior to initiating a conversation. Reviewed patient's implementation of a worry journal. Clinician requested patient continue thought record, practice utilizing socratic questions to challenge negative thoughts/thought  distortions, and maintain worry journal for homework.     Collaboration of Care: Other not required at  this time   Diagnosis:  Mixed obsessional thoughts and acts   Generalized anxiety disorder   Mild episode of recurrent major depressive disorder (HCC)     Plan: Patient is to utilize [employer], thought re-framing, relaxation techniques, mindfulness and coping strategies to decrease symptoms associated with their diagnosis. Frequency: weekly  Modality: individual      Long-term goal:   Reduce overall level, frequency, and intensity of the feelings of depression and anxiety as evidenced by decrease obsessive thoughts, compulsions, worry, depressed mood from 6 to 7 days/week to 0 to 1 days/week per patient report for at least 3 consecutive months. Target Date: 07/30/24  Progress: progressing    Short-term goal:  Verbalize an understanding of the role that distorted thinking plays in creating fears, excessive worry, and ruminations. Target Date: 01/31/24  Progress: progressing    Reduce time spent completing compulsions from 1 hour per day during the week and 4 hours per day on the weekends Target Date: 01/31/24  Progress: progressing    Reduce impulse to immediately complete compulsions, such as, cleaning kitchen after meals, taking clothes out of dryer once dryer is complete, and picking up children's toys Target Date: 01/31/24  Progress: progressing    Develop and implement mindfulness exercises to increase patient's focus on being in the present  Target Date: 01/31/24  Progress: progressing    Identify, challenge, and replace negative thought patterns that contribute to feelings of depression, anxiety, and obsessive thoughts with positive thoughts and beliefs per patient's report Target Date: 01/31/24  Progress: progressing              Doree Barthel, LCSW

## 2023-09-17 NOTE — Progress Notes (Signed)
                Dezi Schaner, LCSW 

## 2023-09-19 ENCOUNTER — Encounter (HOSPITAL_COMMUNITY): Payer: Self-pay | Admitting: Psychiatry

## 2023-09-19 ENCOUNTER — Telehealth (INDEPENDENT_AMBULATORY_CARE_PROVIDER_SITE_OTHER): Payer: No Typology Code available for payment source | Admitting: Psychiatry

## 2023-09-19 DIAGNOSIS — F422 Mixed obsessional thoughts and acts: Secondary | ICD-10-CM | POA: Diagnosis not present

## 2023-09-19 DIAGNOSIS — F330 Major depressive disorder, recurrent, mild: Secondary | ICD-10-CM

## 2023-09-19 DIAGNOSIS — F411 Generalized anxiety disorder: Secondary | ICD-10-CM

## 2023-09-19 DIAGNOSIS — R635 Abnormal weight gain: Secondary | ICD-10-CM | POA: Insufficient documentation

## 2023-09-19 DIAGNOSIS — R519 Headache, unspecified: Secondary | ICD-10-CM

## 2023-09-19 DIAGNOSIS — F5081 Binge eating disorder, mild: Secondary | ICD-10-CM | POA: Insufficient documentation

## 2023-09-19 MED ORDER — NALTREXONE HCL 50 MG PO TABS: ORAL_TABLET | ORAL | 1 refills | Status: DC

## 2023-09-19 MED ORDER — FLUOXETINE HCL 20 MG PO CAPS: 20.0000 mg | ORAL_CAPSULE | Freq: Every day | ORAL | 0 refills | Status: DC

## 2023-09-19 MED ORDER — FLUOXETINE HCL 40 MG PO CAPS: 40.0000 mg | ORAL_CAPSULE | Freq: Every day | ORAL | 0 refills | Status: DC

## 2023-09-19 NOTE — Progress Notes (Signed)
BH MD Outpatient Progress Note  09/19/2023 8:35 AM Stacey Houston  MRN:  [id number]  Assessment:  [name] presents for follow-up evaluation. Today, 09/19/23, patient reports improvement to OCD symptoms with titration of Prozac now able to resist the compulsion to clean a little better and obsessive thoughts have reduced somewhat. She was amenable to titration of Prozac today other address OCD and we will continue to monitor for further weight gain which does coincide somewhat with stopping Topamax and eating more sugary sweets throughout the day.  On that note, has noticed headaches are occurring roughly weekly since coming off the Topamax/amitriptyline but that her depression has improved with discontinuation.  Will see neurology next week.  Will trial naltrexone to see if this helps with the cravings and would not classify as a binge eating disorder at this time.  Sleep still worsened with frequent nighttime awakenings so may need a sleep aid in the future.  She was amenable to referral to nutrition by PCP.  She is taking a vitamin B-12 supplement in addition to her multivitamin.  She continues in psychotherapy.  Follow-up in 6 weeks.   For safety, her acute risk factors for suicide are: Current diagnosis of depression, newborn in the home.  Her chronic risk factors for suicide are: Chronic mental health concerns, access to firearms.  Her protective factors are: Minor children living in the home, supportive family and friends, employment beloved pets, actively seeking and engaging with mental health care, no suicidal ideation, guns are secured in a gun safe.  While future events cannot be fully predicted she does not currently meet IVC criteria and can be continued as an outpatient.  Identifying Information: Stacey Houston is a 37 y.o. female with a history of OCD, generalized anxiety disorder, postpartum depression with worsening of OCD, recurrent major depressive disorder, chronic  headaches who is an established patient with [facility] participating in follow-up via video conferencing. Initial evaluation of OCD on 06/03/2023; please see that note for full case formulation.  Patient reported parents divorced at age 63 and with her mother leaving she does not have much of a relationship with her noting ongoing alcoholism.  Her sister also died around the same time as parents divorce and she saw psychotherapy briefly for this.  She denied a history of trauma but with circumstances of the above suspect this could have possible origin point for psychologic etiology of OCD.  Frequent headaches were also present throughout childhood and lasted into adulthood.  With her 2 pregnancies she had exacerbations of OCD and depression after delivery.  For her OCD, obsessions tend to ruminate on cleanliness which were followed by compulsions of cleaning which last for 2 hours every weekday and then an extended period of cleaning the entire house on Saturdays.  This was exacerbated after finding a bedbug in the home in March 2024 with experiencing extreme low mood for the first time without suicidal ideation.  Was not experiencing as much enjoyment in day-to-day activities so classified depression while improved as ongoing and mild.  Prozac had the best data for an SSRI for OCD which should address the depression and anxiety she experienced as well.  She took Topamax for her headaches and was eating roughly 2 meals per day.      Plan:   # OCD  generalized anxiety disorder Past medication trials:  Status of problem: Not improving as expected Interventions: -- Titrate Prozac to 60 mg once daily (s6/18/24, i7/16/24, i8/19/24,  i10/4/24) -- Continue psychotherapy --Start naltrexone 25 mg daily and if tolerated can increase to 50 mg daily thereafter (s10/4/24)   # Mild recurrent major depressive disorder Past medication trials:  Status of problem: Improving Interventions: --  Prozac, psychotherapy as above  # Weight gain with cravings for sweets rule out side effect from Prozac Past medication trials:  Status of problem: Improving Interventions: -- Naltrexone as above   # Chronic headaches Past medication trials: Topamax, amitriptyline Status of problem: Worsening Interventions: -- Continue amitriptyline as above --Coordinate with PCP for neurology referral  Patient was given contact information for behavioral health clinic and was instructed to call 911 for emergencies.   Subjective:  Chief Complaint:  Chief Complaint  Patient presents with   OCD   Anxiety   Depression   Weight Gain   Follow-up    Interval History: Things have been pretty good; starting to notice a little more difference with mood with the last titration. Still doing therapy weekly. OCD doing a little better, not acting on thoughts as frequently as before. Not sleeping well falling asleep easy then waking after 4hrs with repeated awakenings after that. Has noticed weight gain which has been upsetting, was 130 and now 147. Prior to topamax weight consistently 125lbs. Weighing self once weekly. Has been craving sugar and has been harder to leave it alone. Has been getting more sugary snacks still at 2 meals per day. Food had been helping feel better Doesn't like the way the weight looks and getting winded easier. Stopped topamax due to worsening headache and will be seeing neurology on the 8th, also stopped the amitriptyline as well.  Would be amenable to further titration of fluoxetine to see if this helps reduce OCD symptoms more.  Would also be interested in naltrexone to see if this reduces cravings for sugar.  Visit Diagnosis:    ICD-10-CM   1. Mixed obsessional thoughts and acts  F42.2 naltrexone (DEPADE) 50 MG tablet    FLUoxetine (PROZAC) 20 MG capsule    FLUoxetine (PROZAC) 40 MG capsule    2. Mild episode of recurrent major depressive disorder (HCC)  F33.0 FLUoxetine (PROZAC)  20 MG capsule    FLUoxetine (PROZAC) 40 MG capsule    3. Generalized anxiety disorder  F41.1 FLUoxetine (PROZAC) 20 MG capsule    FLUoxetine (PROZAC) 40 MG capsule    4. Frequent headaches  R51.9     5. Weight gain with sugar craving  R63.5 naltrexone (DEPADE) 50 MG tablet        Past Psychiatric History:  Diagnoses: OCD, generalized anxiety disorder, postpartum depression with worsening of OCD, recurrent major depressive disorder Medication trials: topamax (effective for headache), amitriptyline (ineffective for headache), fluoxetine (effective but possible weight gain) Previous psychiatrist/therapist: yes to therapy Hospitalizations: none Suicide attempts: none SIB: none Hx of violence towards others: none Current access to guns: yes secured in gun safe Hx of trauma/abuse: none Substance use: none  Past Medical History:  Past Medical History:  Diagnosis Date   Adjustment reaction with anxiety and depression 12/26/2022   Anemia    Anxiety    Postpartum   Chronic pelvic pain in female    Constipation 01/10/2015   Depression    Postpartum   H/O chronic cystitis    History of recurrent UTIs    History of urethral stricture    Nephrolithiasis    bilateral non-obstructive per urologist note 06-16-2017  -- dr Marlou Porch   Normal labor 11/10/2018   Pregnancy 12/11/2021  Retained placenta 11/11/2018   Rib pain 12/26/2022    Past Surgical History:  Procedure Laterality Date   AUGMENTATION MAMMAPLASTY Bilateral 2009   CHROMOPERTUBATION N/A 08/12/2017   Procedure: CHROMOPERTUBATION;  Surgeon: Mitchel Honour, DO;  Location: [facility];  Service: Gynecology;  Laterality: N/A;   CYSTO/  URETHRAL DILATION  11/09/2004   DILATION AND EVACUATION N/A 11/11/2018   Procedure: DILATATION AND EVACUATION;  Surgeon: Zelphia Cairo, MD;  Location: [facility];  Service: Gynecology;  Laterality: N/A;   LAPAROSCOPY N/A 08/12/2017   Procedure: LAPAROSCOPY DIAGNOSTIC  Fulgeration OF ENDOMETRIOSI;  Surgeon: Mitchel Honour, DO;  Location: [facility];  Service: Gynecology;  Laterality: N/A;   WISDOM TOOTH EXTRACTION  03/2016    Family Psychiatric History: father history of alcoholism, uncle died from alcoholism, mother alcoholism   Family History:  Family History  Problem Relation Age of Onset   Diabetes Maternal Grandmother    Diabetes Maternal Grandfather    Cancer Paternal Grandmother    Diabetes Paternal Grandmother    Cancer Paternal Grandfather    Diabetes Paternal Grandfather     Social History:  Academic/Vocational: Employed  Social History   Socioeconomic History   Marital status: Married    Spouse name: Not on file   Number of children: Not on file   Years of education: Not on file   Highest education level: Not on file  Occupational History   Not on file  Tobacco Use   Smoking status: Never   Smokeless tobacco: Never  Vaping Use   Vaping status: Never Used  Substance and Sexual Activity   Alcohol use: Not Currently   Drug use: Never   Sexual activity: Yes    Birth control/protection: OCP  Other Topics Concern   Not on file  Social History Narrative   Not on file   Social Determinants of Health   Financial Resource Strain: Not on file  Food Insecurity: Not on file  Transportation Needs: Not on file  Physical Activity: Not on file  Stress: Not on file  Social Connections: Not on file    Allergies:  Allergies  Allergen Reactions   Amoxicillin Hives    Has patient had a PCN reaction causing immediate rash, facial/tongue/throat swelling, SOB or lightheadedness with hypotension: Yes Has patient had a PCN reaction causing severe rash involving mucus membranes or skin necrosis: Yes Has patient had a PCN reaction that required hospitalization: No Has patient had a PCN reaction occurring within the last 10 years: No If all of the above answers are "NO", then may proceed with Cephalosporin use.    Penicillins Hives    Has patient had a PCN reaction causing immediate rash, facial/tongue/throat swelling, SOB or lightheadedness with hypotension: Yes Has patient had a PCN reaction causing severe rash involving mucus membranes or skin necrosis: Yes Has patient had a PCN reaction that required hospitalization: No Has patient had a PCN reaction occurring within the last 10 years: No If all of the above answers are "NO", then may proceed with Cephalosporin use.    Sulfa Antibiotics Hives    Current Medications: Current Outpatient Medications  Medication Sig Dispense Refill   FLUoxetine (PROZAC) 20 MG capsule Take 1 capsule (20 mg total) by mouth daily. Take with 40 mg capsule daily. 90 capsule 0   naltrexone (DEPADE) 50 MG tablet Take half a tablet daily for 3 to 5 days and if no side effects can increase to a full tablet daily thereafter. 30 tablet  1   cyanocobalamin (VITAMIN B12) 250 MCG tablet Take 250 mcg by mouth daily.     FLUoxetine (PROZAC) 40 MG capsule Take 1 capsule (40 mg total) by mouth daily. 90 capsule 0   ibuprofen (ADVIL) 600 MG tablet Take 1 tablet (600 mg total) by mouth every 6 (six) hours as needed. 60 tablet 0   NIKKI 3-0.02 MG tablet Take 1 tablet by mouth daily.     Prenatal Vit-Fe Fumarate-FA (PRENATAL MULTIVITAMIN) TABS tablet Take 1 tablet by mouth daily at 12 noon. 60 tablet 1   No current facility-administered medications for this visit.    ROS: Review of Systems  Constitutional:  Positive for appetite change and unexpected weight change.  Endocrine: Positive for heat intolerance. Negative for cold intolerance and polyphagia.  Neurological:  Positive for headaches.  Psychiatric/Behavioral:  Negative for decreased concentration, dysphoric mood, hallucinations, self-injury, sleep disturbance and suicidal ideas. The patient is nervous/anxious.        Obsessions and compulsions    Objective:  Psychiatric Specialty Exam: not currently breastfeeding.There is  no height or weight on file to calculate BMI.  General Appearance: Neat, Well Groomed, and appears stated age  Eye Contact:  Good  Speech:  Clear and Coherent and Normal Rate  Volume:  Normal  Mood:   "Things have been pretty good but I am gaining weight which I am unhappy about"  Affect:  Appropriate, Congruent, Constricted, and overall still bright but anxious  Thought Content: Logical, Hallucinations: None, and Rumination on cleanliness and sugar cravings/weight number  Suicidal Thoughts:  No  Homicidal Thoughts:  No  Thought Process:  Coherent, Goal Directed, and Linear  Orientation:  Full (Time, Place, and Person)    Memory:  Grossly intact   Judgment:  Fair, difficulty with resisting compulsion and sugar  Insight:  Good  Concentration:  Concentration: Good  Recall:  not formally assessed   Fund of Knowledge: Good  Language: Good  Psychomotor Activity:  Normal  Akathisia:  No  AIMS (if indicated): not done  Assets:  Communication Skills Desire for Improvement Financial Resources/Insurance Housing Intimacy Leisure Time Physical Health Resilience Social Support Talents/Skills Transportation Vocational/Educational  ADL's:  Intact  Cognition: WNL  Sleep:  Fair   PE: General: sits comfortably in view of camera; no acute distress  Pulm: no increased work of breathing on room air  MSK: all extremity movements appear intact  Neuro: no focal neurological deficits observed  Gait & Station: unable to assess by video    Metabolic Disorder Labs: No results found for: "HGBA1C", "MPG" No results found for: "PROLACTIN" Lab Results  Component Value Date   CHOL 174 01/10/2015   TRIG 102.0 01/10/2015   HDL 55.60 01/10/2015   CHOLHDL 3 01/10/2015   VLDL 20.4 01/10/2015   LDLCALC 98 01/10/2015   Lab Results  Component Value Date   TSH 1.24 12/26/2022   TSH 1.23 01/10/2015    Therapeutic Level Labs: No results found for: "LITHIUM" No results found for: "VALPROATE" No  results found for: "CBMZ"  Screenings:  GAD-7    Flowsheet Row Office Visit from [facility]/16/2023 in [facility]  Total GAD-7 Score 19      PHQ2-9    Flowsheet Row Office Visit from 08/28/2023 in [facility] [place] Office Visit from 06/03/2023 in [facility] [place] Office Visit from 04/16/2023 in [facility] [place] Office Visit from 12/26/2022 in [facility]  PHQ-2 Total Score 2 2 6  0  PHQ-9 Total Score 10 6 15  --      Flowsheet Row ED from 08/18/2023 in [facility]  ED from 07/09/2023 in [facility] Visit from 06/03/2023 in [facility] [place]  C-SSRS RISK CATEGORY No Risk No Risk No Risk       Collaboration of Care: Collaboration of Care: Medication Management AEB as above, Primary Care Provider AEB as above, and Referral or follow-up with counselor/therapist AEB as above  Patient/Guardian was advised Release of Information must be obtained prior to any record release in order to collaborate their care with an outside provider. Patient/Guardian was advised if they have not already done so to contact the registration department to sign all necessary forms in order for [place] to release information regarding their care.   Consent: Patient/Guardian gives verbal consent for treatment and assignment of benefits for services provided during this visit. Patient/Guardian expressed understanding and agreed to proceed.   Televisit via video: I connected with patient on 09/19/23 at  8:00 AM EDT by a video enabled telemedicine application and verified that I am speaking with the correct person using two identifiers.  Location: Patient: at home in [place] Provider: remote office in [place]   I discussed the limitations of evaluation and management by telemedicine and  the availability of in person appointments. The patient expressed understanding and agreed to proceed.  I discussed the assessment and treatment plan with the patient. The patient was provided an opportunity to ask questions and all were answered. The patient agreed with the plan and demonstrated an understanding of the instructions.   The patient was advised to call back or seek an in-person evaluation if the symptoms worsen or if the condition fails to improve as anticipated.  I provided 30 minutes of virtual face-to-face time during this encounter.  Elsie [place], MD 09/19/2023, 8:35 AM

## 2023-09-19 NOTE — Patient Instructions (Signed)
We increased the fluoxetine to 60 mg daily which she will take by taking a 40 mg capsule with a 20 mg capsule once daily.  We also added naltrexone to her regimen which should help with some of the cravings for sugar and make eating the sugary snacks less rewarding when it occurs to hopefully break the cycle. Take half a tablet daily for 3 to 5 days and if no side effects can increase to a full tablet (50 mg) daily thereafter.

## 2023-09-23 ENCOUNTER — Encounter (HOSPITAL_COMMUNITY): Payer: Self-pay

## 2023-09-30 ENCOUNTER — Ambulatory Visit (INDEPENDENT_AMBULATORY_CARE_PROVIDER_SITE_OTHER): Payer: No Typology Code available for payment source | Admitting: Clinical

## 2023-09-30 DIAGNOSIS — F330 Major depressive disorder, recurrent, mild: Secondary | ICD-10-CM | POA: Diagnosis not present

## 2023-09-30 DIAGNOSIS — F422 Mixed obsessional thoughts and acts: Secondary | ICD-10-CM | POA: Diagnosis not present

## 2023-09-30 DIAGNOSIS — F411 Generalized anxiety disorder: Secondary | ICD-10-CM

## 2023-09-30 NOTE — Progress Notes (Signed)
                Dezi Schaner, LCSW 

## 2023-09-30 NOTE — Progress Notes (Signed)
[facility] Counselor/Therapist Progress Note  Patient ID: Stacey Houston, MRN: [id number],    Date: 09/30/2023  Time Spent: 8:36am - 9:33am : 57 minutes   Treatment Type: Individual Therapy  Reported Symptoms: Patient reported a decrease in compulsions  Mental Status Exam: Appearance:  Neat and Well Groomed     Behavior: Appropriate  Motor: Normal  Speech/Language:  Clear and Coherent  Affect: Appropriate  Mood: normal  Thought process: normal  Thought content:   WNL  Sensory/Perceptual disturbances:   WNL  Orientation: oriented to person, place, and situation  Attention: Good  Concentration: Good  Memory: WNL  Fund of knowledge:  Good  Insight:   Good  Judgment:  Good  Impulse Control: Good   Risk Assessment: Danger to Self:  No Patient denied current suicidal ideation  Self-injurious Behavior: No Danger to Others: No Patient denied current homicidal ideation Duty to Warn:no Physical Aggression / Violence:No  Access to Firearms a concern: No  Gang Involvement:No   Subjective: Patient stated, "good" in response to events since last session. Patient reported she had an appointment with a neurologist who diagnosed patient with TMJ. Patient reported the neurologist referred patient to a TMJ specialist who also diagnosed patient with TMJ. Patient reported she was prescribed nortriptyline but reported she is not going to take the medication due to concern about weight gain. Patient reported she was also prescribed gabapentin and flexeril. Patient stated, "I'm happy about that", "I feel good" in response to recent evaluations for TMJ. Patient reported the specialist also recommended multiple vitamins and patient reported she is worried about taking multiple medications. Patient stated, "I think its more the unknown, I don't know how its going to go" in reference to medications.  Patient reported she gained 18 lbs when taking amitriptyline in the past. Patient  stated, when she weighs herself she feels "exasperated and disappointed in myself" and reported she experiences the thought, "why did I let this happen because I was where I wanted to be". Patient reported eating chocolate is a comfort and patient reported she was eating an increased amount of chocolate during previous depressive episode. Patient reported watching a movie on the couch with her dog and husband provides comfort. Patient reported she had a conversation with her husband about patient/husband working out together in the evenings and patient reported her husband was in agreement. Patient stated, "I feel like my moods been probably about the same". Patient stated, "my cleaning OCD has gotten much better", "I don't go in and clean every day". Patient stated, "I actually look forward to getting off work and going home, I'm not super tense".   Interventions: Cognitive Behavioral Therapy. Clinician conducted session in person at clinician's office at [facility]. Reviewed events since last session. Provided supportive therapy, active listening, and validation as patient discussed recent medical appointments, diagnosis of TMJ, and patient's thoughts/feelings in response.  Reviewed patient's thought record. Used clarification to better understand patient's experience and assist patient in discussing, exploring, and examining patient's thoughts and feelings related to recent weight gain. Provided psycho education related to depressive symptoms. Assisted patient in exploring and identifying triggers for increase appetite. Explored other activities that provide patient feelings of comfort. Assessed patient's mood since last session and assessed current mood. Provided reflective listening and validation. Clinician requested patient continue thought record, practice utilizing socratic questions to challenge negative thoughts/thought distortions, and maintain worry journal for homework.   Collaboration of  Care: Other not required at this time  Diagnosis:  Mixed obsessional thoughts and acts   Generalized anxiety disorder   Mild episode of recurrent major depressive disorder (HCC)     Plan: Patient is to utilize [employer], thought re-framing, relaxation techniques, mindfulness and coping strategies to decrease symptoms associated with their diagnosis. Frequency: weekly  Modality: individual      Long-term goal:   Reduce overall level, frequency, and intensity of the feelings of depression and anxiety as evidenced by decrease obsessive thoughts, compulsions, worry, depressed mood from 6 to 7 days/week to 0 to 1 days/week per patient report for at least 3 consecutive months. Target Date: 07/30/24  Progress: progressing    Short-term goal:  Verbalize an understanding of the role that distorted thinking plays in creating fears, excessive worry, and ruminations. Target Date: 01/31/24  Progress: progressing    Reduce time spent completing compulsions from 1 hour per day during the week and 4 hours per day on the weekends Target Date: 01/31/24  Progress: progressing    Reduce impulse to immediately complete compulsions, such as, cleaning kitchen after meals, taking clothes out of dryer once dryer is complete, and picking up children's toys Target Date: 01/31/24  Progress: progressing    Develop and implement mindfulness exercises to increase patient's focus on being in the present  Target Date: 01/31/24  Progress: progressing    Identify, challenge, and replace negative thought patterns that contribute to feelings of depression, anxiety, and obsessive thoughts with positive thoughts and beliefs per patient's report Target Date: 01/31/24  Progress: progressing         Doree Barthel, LCSW

## 2023-10-08 ENCOUNTER — Ambulatory Visit (INDEPENDENT_AMBULATORY_CARE_PROVIDER_SITE_OTHER): Payer: No Typology Code available for payment source | Admitting: Clinical

## 2023-10-08 DIAGNOSIS — F411 Generalized anxiety disorder: Secondary | ICD-10-CM | POA: Diagnosis not present

## 2023-10-08 DIAGNOSIS — F422 Mixed obsessional thoughts and acts: Secondary | ICD-10-CM | POA: Diagnosis not present

## 2023-10-08 DIAGNOSIS — F330 Major depressive disorder, recurrent, mild: Secondary | ICD-10-CM | POA: Diagnosis not present

## 2023-10-08 NOTE — Progress Notes (Signed)
                Dezi Schaner, LCSW 

## 2023-10-08 NOTE — Progress Notes (Signed)
[facility] Counselor/Therapist Progress Note  Patient ID: Stacey Houston, MRN: [id number],    Date: 10/08/2023  Time Spent: 8:36am - 9:35am : 59 minutes   Treatment Type: Individual Therapy  Reported Symptoms: Patient reported a decrease in obsessive thoughts and compulsions.   Mental Status Exam: Appearance:  Neat and Well Groomed     Behavior: Appropriate  Motor: Normal  Speech/Language:  Clear and Coherent  Affect: Appropriate  Mood: normal  Thought process: normal  Thought content:   WNL  Sensory/Perceptual disturbances:   WNL  Orientation: oriented to person, place, and situation  Attention: Good  Concentration: Good  Memory: WNL  Fund of knowledge:  Good  Insight:   Good  Judgment:  Good  Impulse Control: Good   Risk Assessment: Danger to Self:  No Patient denied current suicidal ideation  Self-injurious Behavior: No Danger to Others: No Patient denied current homicidal ideation Duty to Warn:no Physical Aggression / Violence:No  Access to Firearms a concern: No  Gang Involvement:No   Subjective: Patient stated, "good" in response to events since last session. Patient reported patient/husband recently traveled to a wedding out of town and stayed at a hotel. Patient reported she was worried about bugs at the hotel. Patient reported when she arrived at the hotel patient checked the mattress for bugs. Patient reported when she arrived home patient took her suitcase in her home and put her clothing up. Patient reported in the past patient would have left the suitcase in the car for several days and washed all of her clothing. Patient stated, "I didn't let that disrupt peace" and stated, "I did good with that". Patient reported she challenged her thought and replaced her thought with a rational thought in response to patient's suitcase/clothing after the trip. Patient reported daily thoughts about the list of incomplete home repairs due to visual triggers  in the home. Patient reported the list of incomplete home repairs triggers feelings of being dismissed and resentment. Patient reported she wrote out her thoughts/feelings that she would like to express to her husband and shared with clinician during session. Patient reported concern that the conversation with her husband will go badly and will impact their relationship. Patient stated, "I feel like there's a wall in our communication". Patient stated, "I think its been pretty good" in response to mood since last session.   Interventions: Cognitive Behavioral Therapy. Clinician conducted session in person at clinician's office at [facility]. Reviewed events since last session. Assisted patient in discussing and identifying thoughts/feelings triggered by recent hotel stay. Reviewed patient's implementation of challenging and re framing cognitive distortions and changes in patient's behavioral response. Discussed patient's thoughts/feelings related to the list of incomplete home repairs and the impact on patient's behaviors. During session, patient shared patient's thoughts/feelings she would like to communicate to her husband, clinician provided psycho education related to communication and use of "I" statements, and assisted patient in re framing patient's statements into "I" statements. Assessed patient's mood. Clinician requested patient continue thought record, practice utilizing socratic questions to challenge negative thoughts/thought distortions, and maintain worry journal for homework.   Collaboration of Care: Other not required at this time   Diagnosis:  Mixed obsessional thoughts and acts   Generalized anxiety disorder   Mild episode of recurrent major depressive disorder (HCC)     Plan: Patient is to utilize [employer], thought re-framing, relaxation techniques, mindfulness and coping strategies to decrease symptoms associated with their diagnosis. Frequency:  weekly  Modality:  individual      Long-term goal:   Reduce overall level, frequency, and intensity of the feelings of depression and anxiety as evidenced by decrease obsessive thoughts, compulsions, worry, depressed mood from 6 to 7 days/week to 0 to 1 days/week per patient report for at least 3 consecutive months. Target Date: 07/30/24  Progress: progressing    Short-term goal:  Verbalize an understanding of the role that distorted thinking plays in creating fears, excessive worry, and ruminations. Target Date: 01/31/24  Progress: progressing    Reduce time spent completing compulsions from 1 hour per day during the week and 4 hours per day on the weekends Target Date: 01/31/24  Progress: progressing    Reduce impulse to immediately complete compulsions, such as, cleaning kitchen after meals, taking clothes out of dryer once dryer is complete, and picking up children's toys Target Date: 01/31/24  Progress: progressing    Develop and implement mindfulness exercises to increase patient's focus on being in the present  Target Date: 01/31/24  Progress: progressing    Identify, challenge, and replace negative thought patterns that contribute to feelings of depression, anxiety, and obsessive thoughts with positive thoughts and beliefs per patient's report Target Date: 01/31/24  Progress: progressing     Doree Barthel, LCSW

## 2023-10-15 ENCOUNTER — Ambulatory Visit (INDEPENDENT_AMBULATORY_CARE_PROVIDER_SITE_OTHER): Payer: No Typology Code available for payment source | Admitting: Clinical

## 2023-10-15 DIAGNOSIS — F422 Mixed obsessional thoughts and acts: Secondary | ICD-10-CM

## 2023-10-15 DIAGNOSIS — F330 Major depressive disorder, recurrent, mild: Secondary | ICD-10-CM | POA: Diagnosis not present

## 2023-10-15 DIAGNOSIS — F411 Generalized anxiety disorder: Secondary | ICD-10-CM

## 2023-10-15 NOTE — Progress Notes (Addendum)
[facility] Counselor/Therapist Progress Note  Patient ID: Stacey Houston, MRN: [id number],    Date: 10/15/2023  Time Spent: 8:35am - 9:35am : 60 minutes   Treatment Type: Individual Therapy  Reported Symptoms: Patient reported a decrease in compulsions and anxiety.   Mental Status Exam: Appearance:  Neat and Well Groomed     Behavior: Appropriate  Motor: Normal  Speech/Language:  Clear and Coherent  Affect: Appropriate  Mood: normal  Thought process: normal  Thought content:   WNL  Sensory/Perceptual disturbances:   WNL  Orientation: oriented to person, place, and situation  Attention: Good  Concentration: Good  Memory: WNL  Fund of knowledge:  Good  Insight:   Good  Judgment:  Good  Impulse Control: Good   Risk Assessment: Danger to Self:  No Patient denied current suicidal ideation  Self-injurious Behavior: No Danger to Others: No Patient denied current homicidal ideation Duty to Warn:no Physical Aggression / Violence:No  Access to Firearms a concern: No  Gang Involvement:No   Subjective: Patient stated, "good" in response to events since last session. Patient stated, "I did not have my discussion though" in reference to a conversation with husband about list of home repairs. Patient stated, "so I don't know about this being done in October which aggravates me" in reference to discussion with husband. Patient reported due to lack of time, activities, and husband being angry about a situation with his father were barriers to patient initiating a conversation. Patient reported feeling "exasperated" and worry when she was not able to talk with her husband about the list of incomplete home repairs. Patient reported feelings of dread  due to the outcome of previous conversations about home repairs and reported she worries the home repairs will be dismissed again. Patient reported she would like her husband to understand completing the list of home repairs is a  priority to patient. Patient reported her husband has initiated conversations with patient in regard to  patient initiating intimacy. Patient reported the list of home repairs not being addressed impacts patient initiating intimacy with her husband. Patient stated, "I did not write anything down about cleaning" in reference to patient's thought record. Patient stated, "I already decided I cleaned the week before and I wasn't going to clorox everything because I did the week before". Patient reported she was not able to clean Saturday morning due to husband's schedule and stated "I've decided I'm going to clean my house once a month".  Patient stated, "I felt really good about that" in response to not being able to deep clean on Saturday and stated, "that part of my worry and stress is 90% better because I've made myself not do things". Patient reported patient's friend and father has noticed a decrease in patient's anxiety and compulsions. Patient reported she noted in her thought record weight gain and reported she has started binge eating. Patient stated, "if I'm not a certain size it makes me very unhappy". Patient reported feeling anger, disappointment, sadness, and frustration in response to weight gain. Patient reported patient and psychiatrist discussed patient's eating habits and discussed patient following up with a nutritionist during a previous session. Patient reported recent weight gain is "bringing my mood way down". Patient stated, "I feel like my mood's pretty good today".    Interventions: Cognitive Behavioral Therapy and Interpersonal. Clinician conducted session in person at clinician's office at [facility]. Reviewed events since last session. Discussed status of patient's conversation with her husband and explored barriers to  patient initiating a conversation. Reviewed patient's thought record entries and assisted patient in discussing and identifying patient's thoughts/feelings in  response to entries. Assisted patient in examining patient's thoughts/feelings associated with conversation with husband and list of incomplete home repairs. Challenged statements/thoughts to assist patient in reframing statements she would like to communicate to her husband into "I" statements. Provided psycho education related to depressive symptoms and the use of behavioral activation. Clinician requested patient continue thought record, practice utilizing socratic questions to challenge negative thoughts/thought distortions, and maintain worry journal for homework.  Collaboration of Care: Other not required at this time   Diagnosis:  Mixed obsessional thoughts and acts   Generalized anxiety disorder   Mild episode of recurrent major depressive disorder (HCC)     Plan: Patient is to utilize [employer], thought re-framing, relaxation techniques, mindfulness and coping strategies to decrease symptoms associated with their diagnosis. Frequency: weekly  Modality: individual      Long-term goal:   Reduce overall level, frequency, and intensity of the feelings of depression and anxiety as evidenced by decrease obsessive thoughts, compulsions, worry, depressed mood from 6 to 7 days/week to 0 to 1 days/week per patient report for at least 3 consecutive months. Target Date: 07/30/24  Progress: progressing    Short-term goal:  Verbalize an understanding of the role that distorted thinking plays in creating fears, excessive worry, and ruminations. Target Date: 01/31/24  Progress: progressing    Reduce time spent completing compulsions from 1 hour per day during the week and 4 hours per day on the weekends Target Date: 01/31/24  Progress: progressing    Reduce impulse to immediately complete compulsions, such as, cleaning kitchen after meals, taking clothes out of dryer once dryer is complete, and picking up children's toys Target Date: 01/31/24  Progress: progressing     Develop and implement mindfulness exercises to increase patient's focus on being in the present  Target Date: 01/31/24  Progress: progressing    Identify, challenge, and replace negative thought patterns that contribute to feelings of depression, anxiety, and obsessive thoughts with positive thoughts and beliefs per patient's report Target Date: 01/31/24  Progress: progressing       Doree Barthel, LCSW

## 2023-10-15 NOTE — Progress Notes (Signed)
                Dezi Schaner, LCSW 

## 2023-10-21 ENCOUNTER — Telehealth (INDEPENDENT_AMBULATORY_CARE_PROVIDER_SITE_OTHER): Payer: No Typology Code available for payment source | Admitting: Psychiatry

## 2023-10-21 ENCOUNTER — Ambulatory Visit: Payer: No Typology Code available for payment source | Admitting: Clinical

## 2023-10-21 ENCOUNTER — Encounter (HOSPITAL_COMMUNITY): Payer: Self-pay | Admitting: Psychiatry

## 2023-10-21 DIAGNOSIS — F422 Mixed obsessional thoughts and acts: Secondary | ICD-10-CM

## 2023-10-21 DIAGNOSIS — F5081 Binge eating disorder, mild: Secondary | ICD-10-CM

## 2023-10-21 DIAGNOSIS — F330 Major depressive disorder, recurrent, mild: Secondary | ICD-10-CM

## 2023-10-21 DIAGNOSIS — Z79899 Other long term (current) drug therapy: Secondary | ICD-10-CM

## 2023-10-21 DIAGNOSIS — F411 Generalized anxiety disorder: Secondary | ICD-10-CM

## 2023-10-21 HISTORY — DX: Other long term (current) drug therapy: Z79.899

## 2023-10-21 MED ORDER — LISDEXAMFETAMINE DIMESYLATE 10 MG PO CAPS: 10.0000 mg | ORAL_CAPSULE | Freq: Every day | ORAL | 0 refills | Status: DC

## 2023-10-21 NOTE — Progress Notes (Unsigned)
                Dezi Schaner, LCSW 

## 2023-10-21 NOTE — Progress Notes (Unsigned)
[facility] Counselor/Therapist Progress Note  Patient ID: Stacey Houston, MRN: [id number],    Date: 10/21/2023  Time Spent: 8:42am - 9:33am : 51 minutes   Treatment Type: Individual Therapy  Reported Symptoms: Patient reported feeling "stressed", anxious about upcoming conversation with husband  Mental Status Exam: Appearance:  Neat and Well Groomed     Behavior: Appropriate  Motor: Normal  Speech/Language:  Clear and Coherent  Affect: Appropriate  Mood: normal  Thought process: normal  Thought content:   WNL  Sensory/Perceptual disturbances:   WNL  Orientation: oriented to person, place, and situation  Attention: Good  Concentration: Good  Memory: WNL  Fund of knowledge:  Good  Insight:   Good  Judgment:  Good  Impulse Control: Good   Risk Assessment: Danger to Self:  No Patient denied current suicidal ideation  Self-injurious Behavior: No Danger to Others: No Patient denied current homicidal ideation Duty to Warn:no  Physical Aggression / Violence:No  Access to Firearms a concern: No  Gang Involvement:No   Subjective: Patietn stated, "good" in response to events sicne last sessoin. Patietn reported patietn and husband did not have the conversation about the lsit of incomplete home repairs. Patient reproted she asked her husband if they could have a conversation tonight and he agreed. Discussed how she feels about upcoming converstaion - "fearful it's not going to go the way I want it to", "stressed", anxoius, and excitement. "I iwll be glad when that conversation's over". Explored how she wants it to go - Patient reported she wants husband to undersrand and empathize with patient's emotions and make a plan to complete the list. Patient reported "most things are completely open between [place] but thi sis not", "so that's weird" - explored her thoughts - "I dont want it to go bad so I put it off". Patient reported she feels when she previously discussed the list  with husband it was a stressful time in their life. "My hope is hat this coversation will go way differently than it did back then", "my approach to him was not good" in reference to previous coversation with husband. Discussed fair fighting rules. Patient stated, "I'm tired of feeling unresolved". Patient stated, "I think the weight gain has really affected my mood, Im so disappointed in myself". Feels like the list of incomplete hoe repairs and weight gain are "two hurdles, "feel like thse are two hurdles, if I can get over will change my state of mind". Patient stated, "I dont feel good about myself" and reported patient's perception is due to recent weight gain. "I'm pretty hopeful" in response to patient's mood today.   Interventions: Cognitive Behavioral Therapy and Interpersonal. Clinician conducted session in person at clinician's office at [facility]. Reviewed events since last session. Discussed status of patient's conversation with her husband and explored barriers to patient initiating a conversation.   Last - Clinician conducted session in person at clinician's office at [facility]. Reviewed events since last session. Discussed status of patient's conversation with her husband and explored barriers to patient initiating a conversation. Reviewed patient's thought record entries and assisted patient in discussing and identifying patient's thoughts/feelings in response to entries. Assisted patient in examining patient's thoughts/feelings associated with conversation with husband and list of incomplete home repairs. Challenged statements/thoughts to assist patient in reframing statements she would like to communicate to her husband into "I" statements. Provided psycho education related to depressive symptoms and the use of behavioral activation. Clinician requested patient continue thought  record, practice utilizing socratic questions to challenge negative thoughts/thought  distortions, and maintain worry journal for homework.   Collaboration of Care: Other not required at this time   Diagnosis:  Mixed obsessional thoughts and acts   Generalized anxiety disorder   Mild episode of recurrent major depressive disorder (HCC)     Plan: Patient is to utilize [employer], thought re-framing, relaxation techniques, mindfulness and coping strategies to decrease symptoms associated with their diagnosis. Frequency: weekly  Modality: individual      Long-term goal:   Reduce overall level, frequency, and intensity of the feelings of depression and anxiety as evidenced by decrease obsessive thoughts, compulsions, worry, depressed mood from 6 to 7 days/week to 0 to 1 days/week per patient report for at least 3 consecutive months. Target Date: 07/30/24  Progress: progressing    Short-term goal:  Verbalize an understanding of the role that distorted thinking plays in creating fears, excessive worry, and ruminations. Target Date: 01/31/24  Progress: progressing    Reduce time spent completing compulsions from 1 hour per day during the week and 4 hours per day on the weekends Target Date: 01/31/24  Progress: progressing    Reduce impulse to immediately complete compulsions, such as, cleaning kitchen after meals, taking clothes out of dryer once dryer is complete, and picking up children's toys Target Date: 01/31/24  Progress: progressing    Develop and implement mindfulness exercises to increase patient's focus on being in the present  Target Date: 01/31/24  Progress: progressing    Identify, challenge, and replace negative thought patterns that contribute to feelings of depression, anxiety, and obsessive thoughts with positive thoughts and beliefs per patient's report Target Date: 01/31/24  Progress: progressing         Doree Barthel, LCSW

## 2023-10-21 NOTE — Patient Instructions (Signed)
We started vyvanse 10mg  daily today to address the impulsivity around binge episodes. We will go slow with the dose to make sure this dose not worsen your OCD. Try to implement the behavioral changes we talked about in session today. When you get a chance, try to come by the clinic at [address] [place], [place] to do the urine drug screen while on the controlled substance (vyvanse).

## 2023-10-21 NOTE — Progress Notes (Signed)
BH MD Outpatient Progress Note  10/21/2023 4:04 PM Stacey Houston  MRN:  [id number]  Assessment:  Stacey Houston presents for follow-up evaluation. Today, 10/21/23, patient reports improvement to OCD symptoms with titration of Prozac now able to resist the compulsion to clean completely, the obsessive thoughts have reduced somewhat personal present.  As discussed in my chart messages the episodes with weight gain does coincide with stopping Topamax so most likely stopping the Topamax uncovered a binge eating disorder rather than a medication side effect from Prozac which should be helpful with impulsive eating.  On that note, has noticed headaches are occurring roughly weekly since coming off the so restarted amitriptyline by outside provider.  Carefully reviewed possible worsening of OCD with start of Vyvanse but given that she is failed naltrexone and Topamax due to side effects there are fairly limited options left to address outside of behavioral changes which have not yet occurred.  Will coordinate with PCP for nutrition referral as well.  She continues in psychotherapy and has made progress there to every other week.  Follow-up in 4 weeks.   For safety, her acute risk factors for suicide are: Current diagnosis of OCD, newborn in the home.  Her chronic risk factors for suicide are: Chronic mental health concerns, access to firearms.  Her protective factors are: Minor children living in the home, supportive family and friends, employment beloved pets, actively seeking and engaging with mental health care, no suicidal ideation, guns are secured in a gun safe.  While future events cannot be fully predicted she does not currently meet IVC criteria and can be continued as an outpatient.  Identifying Information: Stacey Houston is a 37 y.o. female with a history of OCD, generalized anxiety disorder, postpartum depression with worsening of OCD, recurrent major depressive disorder, chronic  headaches who is an established patient with [facility] participating in follow-up via video conferencing. Initial evaluation of OCD on 06/03/2023; please see that note for full case formulation.  Patient reported parents divorced at age 39 and with her mother leaving she does not have much of a relationship with her noting ongoing alcoholism.  Her sister also died around the same time as parents divorce and she saw psychotherapy briefly for this.  She denied a history of trauma but with circumstances of the above suspect this could have possible origin point for psychologic etiology of OCD.  Frequent headaches were also present throughout childhood and lasted into adulthood.  With her 2 pregnancies she had exacerbations of OCD and depression after delivery.  For her OCD, obsessions tend to ruminate on cleanliness which were followed by compulsions of cleaning which last for 2 hours every weekday and then an extended period of cleaning the entire house on Saturdays.  This was exacerbated after finding a bedbug in the home in March 2024 with experiencing extreme low mood for the first time without suicidal ideation.  Was not experiencing as much enjoyment in day-to-day activities so classified depression while improved as ongoing and mild.  Prozac had the best data for an SSRI for OCD which should address the depression and anxiety she experienced as well.  She took Topamax for her headaches and was eating roughly 2 meals per day.      Plan:   # OCD  generalized anxiety disorder Past medication trials:  Status of problem: improving Interventions: -- Continue Prozac 60 mg once daily (s6/18/24, i7/16/24, i8/19/24, i10/4/24) -- Continue psychotherapy   # Mild recurrent major  depressive disorder Past medication trials:  Status of problem: Improving Interventions: -- Prozac, psychotherapy as above  # Binge eating disorder, mild  On stimulant medication Past medication trials:   Status of problem: not improving as expected Interventions: -- start vyvanse 10mg  once daily (s11/5/24) -- Coordinate with PCP for nutrition referral -- urine drug screen   # Chronic headaches Past medication trials: Topamax, amitriptyline Status of problem: improving Interventions: -- Continue amitriptyline as above --Coordinate with PCP for neurology referral  Patient was given contact information for behavioral health clinic and was instructed to call 911 for emergencies.   Subjective:  Chief Complaint:  Chief Complaint  Patient presents with   OCD   Binge eating   Anxiety   Follow-up    Interval History: Reviewed mychart messages. Is still eating sugary treats and with more reflection is having binge episodes where if one oreo is consumed will have a whole box for example. As the depression has lifted, seemed to unlock increased appetite. Has been able to stay off the scale but does not like the way she looks or body feels. Denies restriction or purging to compensate. Not sure if this was purely post prozac or if going on for longer. The binges have been daily for 2 months now, potentially longer. Reviewed impact of coming off of topamax as likely effect on binge episodes. Reviewed vyvanse and possibility of worsening OCD symptoms. Will have a tall Yeti of coffee until lunch time as only food, will have lunch and snack temptations are between then and dinner as well as making dinner and post dinner when alone. She will try to coordinate with PCP for nutrition referral. Has graduated in therapy to every 2 weeks and OCD symptoms have calmed significantly, able to resist compulsions and not do them at this point. Still having obsessive thoughts.   Visit Diagnosis:    ICD-10-CM   1. Binge eating disorder, mild  F50.810 lisdexamfetamine (VYVANSE) 10 MG capsule    2. On stimulant medication  Z79.899 DRUG MONITOR, PANEL 1, SCREEN, URINE    3. Mixed obsessional thoughts and acts   F42.2     4. Mild episode of recurrent major depressive disorder (HCC)  F33.0     5. Generalized anxiety disorder  F41.1          Past Psychiatric History:  Diagnoses: OCD, generalized anxiety disorder, postpartum depression with worsening of OCD, recurrent major depressive disorder Medication trials: topamax (effective for headache), amitriptyline (ineffective for headache), fluoxetine (effective but possible weight gain) Previous psychiatrist/therapist: yes to therapy Hospitalizations: none Suicide attempts: none SIB: none Hx of violence towards others: none Current access to guns: yes secured in gun safe Hx of trauma/abuse: none Substance use: none  Past Medical History:  Past Medical History:  Diagnosis Date   Adjustment reaction with anxiety and depression 12/26/2022   Anemia    Anxiety    Postpartum   Chronic pelvic pain in female    Constipation 01/10/2015   Depression    Postpartum   H/O chronic cystitis    History of recurrent UTIs    History of urethral stricture    Nephrolithiasis    bilateral non-obstructive per urologist note 06-16-2017  -- dr Marlou Porch   Normal labor 11/10/2018   Pregnancy 12/11/2021   Retained placenta 11/11/2018   Rib pain 12/26/2022    Past Surgical History:  Procedure Laterality Date   AUGMENTATION MAMMAPLASTY Bilateral 2009   CHROMOPERTUBATION N/A 08/12/2017   Procedure: CHROMOPERTUBATION;  Surgeon: Langston Masker,  Aundra Millet, DO;  Location: [facility];  Service: Gynecology;  Laterality: N/A;   CYSTO/  URETHRAL DILATION  11/09/2004   DILATION AND EVACUATION N/A 11/11/2018   Procedure: DILATATION AND EVACUATION;  Surgeon: Zelphia Cairo, MD;  Location: [facility];  Service: Gynecology;  Laterality: N/A;   LAPAROSCOPY N/A 08/12/2017   Procedure: LAPAROSCOPY DIAGNOSTIC [place] OF ENDOMETRIOSI;  Surgeon: Mitchel Honour, DO;  Location: [facility];  Service: Gynecology;  Laterality: N/A;   WISDOM TOOTH  EXTRACTION  03/2016    Family Psychiatric History: father history of alcoholism, uncle died from alcoholism, mother alcoholism   Family History:  Family History  Problem Relation Age of Onset   Diabetes Maternal Grandmother    Diabetes Maternal Grandfather    Cancer Paternal Grandmother    Diabetes Paternal Grandmother    Cancer Paternal Grandfather    Diabetes Paternal Grandfather     Social History:  Academic/Vocational: Employed  Social History   Socioeconomic History   Marital status: Married    Spouse name: Not on file   Number of children: Not on file   Years of education: Not on file   Highest education level: Not on file  Occupational History   Not on file  Tobacco Use   Smoking status: Never   Smokeless tobacco: Never  Vaping Use   Vaping status: Never Used  Substance and Sexual Activity   Alcohol use: Not Currently   Drug use: Never   Sexual activity: Yes    Birth control/protection: OCP  Other Topics Concern   Not on file  Social History Narrative   Not on file   Social Determinants of Health   Financial Resource Strain: Not on file  Food Insecurity: Not on file  Transportation Needs: Not on file  Physical Activity: Not on file  Stress: Not on file  Social Connections: Not on file    Allergies:  Allergies  Allergen Reactions   Amoxicillin Hives    Has patient had a PCN reaction causing immediate rash, facial/tongue/throat swelling, SOB or lightheadedness with hypotension: Yes Has patient had a PCN reaction causing severe rash involving mucus membranes or skin necrosis: Yes Has patient had a PCN reaction that required hospitalization: No Has patient had a PCN reaction occurring within the last 10 years: No If all of the above answers are "NO", then may proceed with Cephalosporin use.   Penicillins Hives    Has patient had a PCN reaction causing immediate rash, facial/tongue/throat swelling, SOB or lightheadedness with hypotension: Yes Has  patient had a PCN reaction causing severe rash involving mucus membranes or skin necrosis: Yes Has patient had a PCN reaction that required hospitalization: No Has patient had a PCN reaction occurring within the last 10 years: No If all of the above answers are "NO", then may proceed with Cephalosporin use.    Sulfa Antibiotics Hives    Current Medications: Current Outpatient Medications  Medication Sig Dispense Refill   cyclobenzaprine (FLEXERIL) 5 MG tablet Take 5-10 mg by mouth at bedtime.     gabapentin (NEURONTIN) 100 MG capsule Take 300 mg by mouth 3 (three) times daily.     lisdexamfetamine (VYVANSE) 10 MG capsule Take 1 capsule (10 mg total) by mouth daily. 30 capsule 0   MAGNESIUM GLUCONATE PO Take by mouth at bedtime.     TURMERIC PO Take by mouth 2 (two) times daily.     FLUoxetine (PROZAC) 20 MG capsule Take 1 capsule (20 mg total) by mouth  daily. Take with 40 mg capsule daily. 90 capsule 0   FLUoxetine (PROZAC) 40 MG capsule Take 1 capsule (40 mg total) by mouth daily. 90 capsule 0   ibuprofen (ADVIL) 600 MG tablet Take 1 tablet (600 mg total) by mouth every 6 (six) hours as needed. 60 tablet 0   NIKKI 3-0.02 MG tablet Take 1 tablet by mouth daily.     Prenatal Vit-Fe Fumarate-FA (PRENATAL MULTIVITAMIN) TABS tablet Take 1 tablet by mouth daily at 12 noon. 60 tablet 1   No current facility-administered medications for this visit.    ROS: Review of Systems  Constitutional:  Positive for appetite change and unexpected weight change.  Endocrine: Positive for heat intolerance and polyphagia. Negative for cold intolerance.  Neurological:  Positive for headaches.  Psychiatric/Behavioral:  Negative for decreased concentration, dysphoric mood, hallucinations, self-injury, sleep disturbance and suicidal ideas. The patient is nervous/anxious.        Obsessions but no longer compulsions    Objective:  Psychiatric Specialty Exam: not currently breastfeeding.There is no height or  weight on file to calculate BMI.  General Appearance: Neat, Well Groomed, and appears stated age  Eye Contact:  Good  Speech:  Clear and Coherent and Normal Rate  Volume:  Normal  Mood:   "The OCD is a lot better but unhappy about the weight gain"  Affect:  Appropriate, Congruent, Constricted, and overall still bright but anxious  Thought Content: Logical, Hallucinations: None, and Rumination on cleanliness and sugar cravings/weight number  Suicidal Thoughts:  No  Homicidal Thoughts:  No  Thought Process:  Coherent, Goal Directed, and Linear  Orientation:  Full (Time, Place, and Person)    Memory:  Grossly intact   Judgment:  Fair, difficulty with resisting binge on sugar  Insight:  Good  Concentration:  Concentration: Good  Recall:  not formally assessed   Fund of Knowledge: Good  Language: Good  Psychomotor Activity:  Normal  Akathisia:  No  AIMS (if indicated): not done  Assets:  Communication Skills Desire for Improvement Financial Resources/Insurance Housing Intimacy Leisure Time Physical Health Resilience Social Support Talents/Skills Transportation Vocational/Educational  ADL's:  Intact  Cognition: WNL  Sleep:  Fair   PE: General: sits comfortably in view of camera; no acute distress  Pulm: no increased work of breathing on room air  MSK: all extremity movements appear intact  Neuro: no focal neurological deficits observed  Gait & Station: unable to assess by video    Metabolic Disorder Labs: No results found for: "HGBA1C", "MPG" No results found for: "PROLACTIN" Lab Results  Component Value Date   CHOL 174 01/10/2015   TRIG 102.0 01/10/2015   HDL 55.60 01/10/2015   CHOLHDL 3 01/10/2015   VLDL 20.4 01/10/2015   LDLCALC 98 01/10/2015   Lab Results  Component Value Date   TSH 1.24 12/26/2022   TSH 1.23 01/10/2015    Therapeutic Level Labs: No results found for: "LITHIUM" No results found for: "VALPROATE" No results found for:  "CBMZ"  Screenings:  GAD-7    Flowsheet Row Office Visit from 04/16/2023 in [facility]  Total GAD-7 Score 19      PHQ2-9    Flowsheet Row Office Visit from 08/28/2023 in [facility] [place] Office Visit from 06[facility]03/2023 in [facility] [place] Office Visit from 04/16/2023 in [facility] Office Visit from 12/26/2022 in [facility] [place]  PHQ-2  Total Score 2 2 6  0  PHQ-9 Total Score 10 6 15  --      Flowsheet Row ED from 08/18/2023 in [facility]  ED from 07/09/2023 in [facility] Visit from 06/03/2023 in [facility] at [place]  C-SSRS RISK CATEGORY No Risk No Risk No Risk       Collaboration of Care: Collaboration of Care: Medication Management AEB as above, Primary Care Provider AEB as above, and Referral or follow-up with counselor/therapist AEB as above  Patient/Guardian was advised Release of Information must be obtained prior to any record release in order to collaborate their care with an outside provider. Patient/Guardian was advised if they have not already done so to contact the registration department to sign all necessary forms in order for [place] to release information regarding their care.   Consent: Patient/Guardian gives verbal consent for treatment and assignment of benefits for services provided during this visit. Patient/Guardian expressed understanding and agreed to proceed.   Televisit via video: I connected with patient on 10/21/23 at  2:30 PM EST by a video enabled telemedicine application and verified that I am speaking with the correct person using two identifiers.  Location: Patient: at home in [place] Provider: remote office in [place]   I discussed the limitations of evaluation and management by telemedicine and the availability  of in person appointments. The patient expressed understanding and agreed to proceed.  I discussed the assessment and treatment plan with the patient. The patient was provided an opportunity to ask questions and all were answered. The patient agreed with the plan and demonstrated an understanding of the instructions.   The patient was advised to call back or seek an in-person evaluation if the symptoms worsen or if the condition fails to improve as anticipated.  I provided 30 minutes of virtual face-to-face time during this encounter.  Elsie Lincoln, MD 10/21/2023, 4:04 PM

## 2023-10-22 ENCOUNTER — Other Ambulatory Visit: Payer: Self-pay | Admitting: Primary Care

## 2023-10-22 DIAGNOSIS — F422 Mixed obsessional thoughts and acts: Secondary | ICD-10-CM

## 2023-10-22 DIAGNOSIS — F5081 Binge eating disorder, mild: Secondary | ICD-10-CM

## 2023-11-04 ENCOUNTER — Ambulatory Visit (INDEPENDENT_AMBULATORY_CARE_PROVIDER_SITE_OTHER): Payer: No Typology Code available for payment source | Admitting: Clinical

## 2023-11-04 ENCOUNTER — Telehealth (HOSPITAL_COMMUNITY): Payer: No Typology Code available for payment source | Admitting: Psychiatry

## 2023-11-04 DIAGNOSIS — F330 Major depressive disorder, recurrent, mild: Secondary | ICD-10-CM

## 2023-11-04 DIAGNOSIS — F422 Mixed obsessional thoughts and acts: Secondary | ICD-10-CM

## 2023-11-04 DIAGNOSIS — F411 Generalized anxiety disorder: Secondary | ICD-10-CM

## 2023-11-04 NOTE — Progress Notes (Signed)
[facility] Counselor/Therapist Progress Note  Patient ID: Stacey Houston, MRN: [id number],    Date: 11/04/2023  Time Spent: 8:38am - 9:23am : 45 minutes   Treatment Type: Individual Therapy  Reported Symptoms: patient reported binge eating  Mental Status Exam: Appearance:  Neat and Well Groomed     Behavior: Appropriate  Motor: Normal  Speech/Language:  Clear and Coherent  Affect: Appropriate  Mood: normal  Thought process: normal  Thought content:   WNL  Sensory/Perceptual disturbances:   WNL  Orientation: oriented to person, place, and situation  Attention: Good  Concentration: Good  Memory: WNL  Fund of knowledge:  Good  Insight:   Good  Judgment:  Good  Impulse Control: Good   Risk Assessment: Danger to Self:  No Patient denied current suicidal ideation  Self-injurious Behavior: No Danger to Others: No Patient denied current homicidal ideation Duty to Warn:no Physical Aggression / Violence:No  Access to Firearms a concern: No  Gang Involvement:No   Subjective: Patient reported she recently had a conversation with her husband to discuss the list of incomplete home repairs and stated, "he was very receptive". Patient reported patient/husband established a time line of one year to complete the list of incomplete home repairs. Patient stated, "it went a whole lot better than I thought it would", "I thought he was going to think it wasn't important and he was going to overact". Patient stated,  "Im glad that's (conversation with husband) over" and stated, "every thing else has been going pretty good". Patient stated, "I get stressed this time of the year" and reported holidays, multiple birthdays are stressors during this time of year. Patient stated, "the eating I guess because of the weight has been bothering me". Patient stated, "I'm in a good place with that" in reference to frequent cleaning and reported she is not cleaning her home after work. Patient  reported she stopped taking gabapentin due to the side effects and reported she noted a decrease in a craving for sweets after discontinuing the medication. Patient reported patient's PCP referred patient to a nutritionist. Patient reported patient's psychiatrist indicated patient has been binge eating and prescribed vyvanse. Patient reported she has been eating one hard candy each day to satisfy a craving for sweets and reported she is no longer keeping sweets in her home. Patient stated, "I feel very good about things most of the time" in response to patient's mood. Patient stated, "I would say my stress is much better". Patient reported patient's husband is in the process of building a tree house and patient reported she has observed herself focusing on the tasks that are not completed. Patient stated, "I'm pretty good" in response to current mood.   Interventions: Cognitive Behavioral Therapy. Clinician conducted session in person at clinician's office at [facility]. Reviewed events since last session. Discussed the status of patient's recent conversation with her husband, the outcome, and communication strategies patient utilized during the conversation. Explored the impact of recent conversation on patient's mood. Discussed patient's concerns as it relates to patient's appetite, eating habits, and strategies patient has implemented to reduce binge eating. Assisted patient in examining recent triggers for anxiety and cognitive distortions associated with triggers. Provided psycho education related to cognitive distortions, such as, disqualifying the positives. Assessed current mood. Discussed changing the frequency of sessions to bi-weekly as a result of patient's progress in therapy. Clinician requested patient continue thought record, practice utilizing socratic questions to challenge negative thoughts/thought distortions, maintain worry journal for  homework, and practice identifying one positive  for every negative thought patient experiences.   Collaboration of Care: Other not required at this time   Diagnosis:  Mixed obsessional thoughts and acts   Generalized anxiety disorder   Mild episode of recurrent major depressive disorder (HCC)     Plan: Patient is to utilize [employer] [facility], thought re-framing, relaxation techniques, mindfulness and coping strategies to decrease symptoms associated with their diagnosis. Frequency: bi-weekly  Modality: individual      Long-term goal:   Reduce overall level, frequency, and intensity of the feelings of depression and anxiety as evidenced by decrease obsessive thoughts, compulsions, worry, depressed mood from 6 to 7 days/week to 0 to 1 days/week per patient report for at least 3 consecutive months. Target Date: 07/30/24  Progress: progressing    Short-term goal:  Verbalize an understanding of the role that distorted thinking plays in creating fears, excessive worry, and ruminations. Target Date: 01/31/24  Progress: progressing    Reduce time spent completing compulsions from 1 hour per day during the week and 4 hours per day on the weekends Target Date: 01/31/24  Progress: progressing    Reduce impulse to immediately complete compulsions, such as, cleaning kitchen after meals, taking clothes out of dryer once dryer is complete, and picking up children's toys Target Date: 01/31/24  Progress: progressing    Develop and implement mindfulness exercises to increase patient's focus on being in the present  Target Date: 01/31/24  Progress: progressing    Identify, challenge, and replace negative thought patterns that contribute to feelings of depression, anxiety, and obsessive thoughts with positive thoughts and beliefs per patient's report Target Date: 01/31/24  Progress: progressing         Doree Barthel, LCSW

## 2023-11-04 NOTE — Progress Notes (Signed)
                Dezi Schaner, LCSW 

## 2023-11-18 ENCOUNTER — Telehealth (HOSPITAL_COMMUNITY): Payer: No Typology Code available for payment source | Admitting: Psychiatry

## 2023-11-18 ENCOUNTER — Encounter (HOSPITAL_COMMUNITY): Payer: Self-pay

## 2023-11-18 ENCOUNTER — Ambulatory Visit (INDEPENDENT_AMBULATORY_CARE_PROVIDER_SITE_OTHER): Payer: No Typology Code available for payment source | Admitting: Clinical

## 2023-11-18 DIAGNOSIS — F411 Generalized anxiety disorder: Secondary | ICD-10-CM | POA: Diagnosis not present

## 2023-11-18 DIAGNOSIS — F422 Mixed obsessional thoughts and acts: Secondary | ICD-10-CM | POA: Diagnosis not present

## 2023-11-18 DIAGNOSIS — F330 Major depressive disorder, recurrent, mild: Secondary | ICD-10-CM | POA: Diagnosis not present

## 2023-11-18 NOTE — Progress Notes (Signed)
[facility] Counselor/Therapist Progress Note  Patient ID: Stacey Houston, MRN: [id number],    Date: 11/18/2023  Time Spent: 8:39am - 9:31am : 52 minutes   Treatment Type: Individual Therapy  Reported Symptoms: Patient reported binge eating  Mental Status Exam: Appearance:  Neat and Well Groomed     Behavior: Appropriate  Motor: Normal  Speech/Language:  Clear and Coherent  Affect: Appropriate  Mood: normal  Thought process: normal  Thought content:   WNL  Sensory/Perceptual disturbances:   WNL  Orientation: oriented to person, place, and situation  Attention: Good  Concentration: Good  Memory: WNL  Fund of knowledge:  Good  Insight:   Good  Judgment:  Good  Impulse Control: Fair   Risk Assessment: Danger to Self:  No  Patient denied current suicidal ideation  Self-injurious Behavior: No Danger to Others: No Patient denied current homicidal ideation Duty to Warn:no Physical Aggression / Violence:No  Access to Firearms a concern: No  Gang Involvement:No   Subjective: Patient stated, "good" in response to events since last session.  Patient reported [place] and patient packed a bag for her children. Patient reported when packing the bag patient's thought was that she was going to have to unpack the bag when she returns home. Patient stated, "I reminded myself you have all evening to get this stuff put away", "even if you put it away tomorrow its not the end of the world". Patient reported she hosted her daughter's birthday party and reported typically she would have "deep cleaned" prior to the party. Patient stated, she did not "deep clean and [place] after everyone left like I normally would". Patient reported no anxiety in response to the party. Patient reported she ate a full box of cookies yesterday. Patient reported she feels when she set limitations on her eating the limitations trigger patient to binge eat. Patient  stated, "yesterday I was not in a good mood", "my mood was better when I was eating well and not binging". Patient reported feeling "happy because I like chocolate" and stated, "I think food is a very big comfort for me", "it just makes me feel happy". Patient stated, "when I'm binge eating it makes me feel down, mad". Patient reported watching television/movies with her family, "cuddling with my dog", completing tasks, reading, jigsaw puzzles, and exercise are activities that make patient feel happy and promote feelings of comfort. Patient stated, "I want it to be all or nothing", "I don't want to do a little bit" in reference to decorating, cleaning, working out. Patient stated, "sometimes I wonder if the way I grew up makes me want things to be perfect". Patient reported her parents divorced when patient was young and patient reported she wanted the family unit she did not have. Patient reported her sister passed away from spina bifida and patient found her sister unresponsive. Patient stated, "that was traumatic" in reference to the events surrounding her sister's death. Patient reported her parents divorced within a year of patient's sister's passing. Patient stated, "I feel like my mood's good today".  Interventions: Cognitive Behavioral Therapy. Clinician conducted session in person at clinician's office at [facility]. Reviewed events since last session. Reviewed patient's thought record entries. Explored and identified thoughts/feelings triggered by recent events. Reviewed patient's implementation of challenging cognitive distortions. Assessed frequency and intensity of anxiety. Discussed patient's concerns related to binge eating. Assisted patient in exploring and identifying triggers for binge eating. Assisted patient in exploring and  identifying thoughts/feelings associated with eating. Assisted patient in exploring and identifying other activities that promote feelings of happiness and  comfort. Provided psycho education related to cognitive distortions, such as, "all of nothing". Discussed family dynamics during patient's childhood and explored patient's motivation for perfection. Clinician requested patient continue thought record, practice utilizing socratic questions to challenge negative thoughts/thought distortions, maintain worry journal for homework, and practice identifying one positive for every negative thought patient experiences.   Collaboration of Care: Other not required at this time   Diagnosis:  Mixed obsessional thoughts and acts   Generalized anxiety disorder   Mild episode of recurrent major depressive disorder (HCC)     Plan: Patient is to utilize [employer] [facility], thought re-framing, relaxation techniques, mindfulness and coping strategies to decrease symptoms associated with their diagnosis. Frequency: bi-weekly  Modality: individual      Long-term goal:   Reduce overall level, frequency, and intensity of the feelings of depression and anxiety as evidenced by decrease obsessive thoughts, compulsions, worry, depressed mood from 6 to 7 days/week to 0 to 1 days/week per patient report for at least 3 consecutive months. Target Date: 07/30/24  Progress: progressing    Short-term goal:  Verbalize an understanding of the role that distorted thinking plays in creating fears, excessive worry, and ruminations. Target Date: 01/31/24  Progress: progressing    Reduce time spent completing compulsions from 1 hour per day during the week and 4 hours per day on the weekends Target Date: 01/31/24  Progress: progressing    Reduce impulse to immediately complete compulsions, such as, cleaning kitchen after meals, taking clothes out of dryer once dryer is complete, and picking up children's toys Target Date: 01/31/24  Progress: progressing    Develop and implement mindfulness exercises to increase patient's focus on being in the present  Target Date:  01/31/24  Progress: progressing    Identify, challenge, and replace negative thought patterns that contribute to feelings of depression, anxiety, and obsessive thoughts with positive thoughts and beliefs per patient's report Target Date: 01/31/24  Progress: progressing       Doree Barthel, LCSW

## 2023-11-18 NOTE — Progress Notes (Signed)
                Dezi Schaner, LCSW 

## 2023-11-24 ENCOUNTER — Telehealth (INDEPENDENT_AMBULATORY_CARE_PROVIDER_SITE_OTHER): Payer: No Typology Code available for payment source | Admitting: Psychiatry

## 2023-11-24 ENCOUNTER — Encounter (HOSPITAL_COMMUNITY): Payer: Self-pay | Admitting: Psychiatry

## 2023-11-24 DIAGNOSIS — Z79899 Other long term (current) drug therapy: Secondary | ICD-10-CM

## 2023-11-24 DIAGNOSIS — F422 Mixed obsessional thoughts and acts: Secondary | ICD-10-CM | POA: Diagnosis not present

## 2023-11-24 DIAGNOSIS — F330 Major depressive disorder, recurrent, mild: Secondary | ICD-10-CM | POA: Diagnosis not present

## 2023-11-24 DIAGNOSIS — F411 Generalized anxiety disorder: Secondary | ICD-10-CM

## 2023-11-24 DIAGNOSIS — F5081 Binge eating disorder, mild: Secondary | ICD-10-CM | POA: Diagnosis not present

## 2023-11-24 MED ORDER — FLUOXETINE HCL 20 MG PO CAPS: 20.0000 mg | ORAL_CAPSULE | Freq: Every day | ORAL | 0 refills | Status: DC

## 2023-11-24 MED ORDER — FLUOXETINE HCL 40 MG PO CAPS: 40.0000 mg | ORAL_CAPSULE | Freq: Every day | ORAL | 0 refills | Status: DC

## 2023-11-24 MED ORDER — LISDEXAMFETAMINE DIMESYLATE 20 MG PO CAPS: 20.0000 mg | ORAL_CAPSULE | Freq: Every day | ORAL | 0 refills | Status: DC

## 2023-11-24 NOTE — Progress Notes (Signed)
BH MD Outpatient Progress Note  11/24/2023 11:52 AM Stacey Houston  MRN:  [id number]  Assessment:  [name] presents for follow-up evaluation. Today, 11/24/23, patient reports improvement to OCD symptoms with combination of Prozac and psychotherapy now able to resist the compulsion to clean completely, the obsessive thoughts continue to be less frequent.  Headaches have also stayed relatively minimal despite coming off of gabapentin and starting Vyvanse.  Not noticing much improvement to binge episodes and mostly dependent on whether sugary snacks are available or not but no side effects with Vyvanse so we will titrate today.  She will come by the clinic to get a urine toxicology screen before next appointment. Carefully reviewed possible worsening of OCD with start of Vyvanse but given that she is failed naltrexone and Topamax due to side effects there are fairly limited options left to address outside of behavioral changes which have not yet occurred.  Will see nutrition this week.  She continues in psychotherapy and has made progress there to every other week.  Follow-up in 4 weeks.   For safety, her acute risk factors for suicide are: Current diagnosis of OCD, newborn in the home.  Her chronic risk factors for suicide are: Chronic mental health concerns, access to firearms.  Her protective factors are: Minor children living in the home, supportive family and friends, employment beloved pets, actively seeking and engaging with mental health care, no suicidal ideation, guns are secured in a gun safe.  While future events cannot be fully predicted she does not currently meet IVC criteria and can be continued as an outpatient.  Identifying Information: Stacey Houston is a 37 y.o. female with a history of OCD, generalized anxiety disorder, postpartum depression with worsening of OCD, recurrent major depressive disorder, chronic headaches who is an established patient with [facility] participating in follow-up via video conferencing. Initial evaluation of OCD on 06/03/2023; please see that note for full case formulation.  As discussed in my chart messages the episodes with weight gain does coincide with stopping Topamax so most likely stopping the Topamax uncovered a binge eating disorder rather than a medication side effect from Prozac which should be helpful with impulsive eating.     Plan:   # OCD  generalized anxiety disorder Past medication trials:  Status of problem: improving Interventions: -- Continue Prozac 60 mg once daily (s6/18/24, i7/16/24, i8/19/24, i10/4/24) -- Continue psychotherapy   # Mild recurrent major depressive disorder Past medication trials:  Status of problem: Improving Interventions: -- Prozac, psychotherapy as above  # Binge eating disorder, mild  On stimulant medication Past medication trials:  Status of problem: not improving as expected Interventions: -- Titrate vyvanse to 20mg  once daily (s11/5/24, i12/9/24) -- Coordinate with PCP for nutrition referral -- urine drug screen   # Chronic headaches Past medication trials: Topamax, amitriptyline Status of problem: improving Interventions: --Coordinate with PCP for neurology referral  Patient was given contact information for behavioral health clinic and was instructed to call 911 for emergencies.   Subjective:  Chief Complaint:  Chief Complaint  Patient presents with   OCD   Eating Disorder   Follow-up   Anxiety   Depression    Interval History: Couldn't tell much of a difference with starting the vyvanse but did stop the gabapentin because felt that it was having an impact on increasing her appetite. Will see nutrition this week. No worsening of OCD or headaches/chest pain, sleep not quite as good but may be  related to the gabapentin discontinuation. More so early awakening. Finding not as hungry rather than curbing binges as when sweets are are  available will binge. Mealtime plate is about half filled at this point, no weight loss. Headaches have been better even with the gabapentin discontinuation, the flexeril at night has been helping. OCD overall has been well controlled and happy with progress of treatment. Attributes progress as well to ongoing therapy and graduating to once every 2 weeks. Will still have a tall Yeti of coffee until lunch time as only food (except on Sundays when having a bigger breakfast), will have lunch and snack temptations are between then and dinner as well as making dinner and post dinner when alone.   Visit Diagnosis:    ICD-10-CM   1. Mild episode of recurrent major depressive disorder (HCC)  F33.0 FLUoxetine (PROZAC) 20 MG capsule    FLUoxetine (PROZAC) 40 MG capsule    2. Mixed obsessional thoughts and acts  F42.2 FLUoxetine (PROZAC) 20 MG capsule    FLUoxetine (PROZAC) 40 MG capsule    3. Generalized anxiety disorder  F41.1 FLUoxetine (PROZAC) 20 MG capsule    FLUoxetine (PROZAC) 40 MG capsule    4. Binge eating disorder, mild  F50.810 lisdexamfetamine (VYVANSE) 20 MG capsule    5. On stimulant medication  Z79.899       Past Psychiatric History:  Diagnoses: OCD, generalized anxiety disorder, postpartum depression with worsening of OCD, recurrent major depressive disorder, binge eating disorder Medication trials: topamax (effective for headache), amitriptyline (ineffective for headache), fluoxetine (effective but possible weight gain) Previous psychiatrist/therapist: yes to therapy Hospitalizations: none Suicide attempts: none SIB: none Hx of violence towards others: none Current access to guns: yes secured in gun safe Hx of trauma/abuse: none Substance use: none  Past Medical History:  Past Medical History:  Diagnosis Date   Adjustment reaction with anxiety and depression 12/26/2022   Anemia    Anxiety    Postpartum   Chronic pelvic pain in female    Constipation 01/10/2015    Depression    Postpartum   H/O chronic cystitis    History of recurrent UTIs    History of urethral stricture    Nephrolithiasis    bilateral non-obstructive per urologist note 06-16-2017  -- dr Marlou Porch   Normal labor 11/10/2018   Pregnancy 12/11/2021   Retained placenta 11/11/2018   Rib pain 12/26/2022    Past Surgical History:  Procedure Laterality Date   AUGMENTATION MAMMAPLASTY Bilateral 2009   CHROMOPERTUBATION N/A 08/12/2017   Procedure: CHROMOPERTUBATION;  Surgeon: Mitchel Honour, DO;  Location: [facility];  Service: Gynecology;  Laterality: N/A;   CYSTO/  URETHRAL DILATION  11/09/2004   DILATION AND EVACUATION N/A 11/11/2018   Procedure: DILATATION AND EVACUATION;  Surgeon: Zelphia Cairo, MD;  Location: [facility];  Service: Gynecology;  Laterality: N/A;   LAPAROSCOPY N/A 08/12/2017   Procedure: LAPAROSCOPY DIAGNOSTIC [place] OF ENDOMETRIOSI;  Surgeon: Mitchel Honour, DO;  Location: [facility];  Service: Gynecology;  Laterality: N/A;   WISDOM TOOTH EXTRACTION  03/2016    Family Psychiatric History: father history of alcoholism, uncle died from alcoholism, mother alcoholism   Family History:  Family History  Problem Relation Age of Onset   Diabetes Maternal Grandmother    Diabetes Maternal Grandfather    Cancer Paternal Grandmother    Diabetes Paternal Grandmother    Cancer Paternal Grandfather    Diabetes Paternal Grandfather     Social History:  Academic/Vocational: Employed  Social History   Socioeconomic History   Marital status: Married    Spouse name: Not on file   Number of children: Not on file   Years of education: Not on file   Highest education level: Not on file  Occupational History   Not on file  Tobacco Use   Smoking status: Never   Smokeless tobacco: Never  Vaping Use   Vaping status: Never Used  Substance and Sexual Activity   Alcohol use: Not Currently   Drug use: Never   Sexual activity:  Yes    Birth control/protection: OCP  Other Topics Concern   Not on file  Social History Narrative   Not on file   Social Determinants of Health   Financial Resource Strain: Not on file  Food Insecurity: Not on file  Transportation Needs: Not on file  Physical Activity: Not on file  Stress: Not on file  Social Connections: Not on file    Allergies:  Allergies  Allergen Reactions   Amoxicillin Hives    Has patient had a PCN reaction causing immediate rash, facial/tongue/throat swelling, SOB or lightheadedness with hypotension: Yes Has patient had a PCN reaction causing severe rash involving mucus membranes or skin necrosis: Yes Has patient had a PCN reaction that required hospitalization: No Has patient had a PCN reaction occurring within the last 10 years: No If all of the above answers are "NO", then may proceed with Cephalosporin use.   Penicillins Hives    Has patient had a PCN reaction causing immediate rash, facial/tongue/throat swelling, SOB or lightheadedness with hypotension: Yes Has patient had a PCN reaction causing severe rash involving mucus membranes or skin necrosis: Yes Has patient had a PCN reaction that required hospitalization: No Has patient had a PCN reaction occurring within the last 10 years: No If all of the above answers are "NO", then may proceed with Cephalosporin use.    Sulfa Antibiotics Hives    Current Medications: Current Outpatient Medications  Medication Sig Dispense Refill   APPLE CIDER VINEGAR PO Take by mouth daily.     ASHWAGANDHA GUMMIES PO Take by mouth daily.     cyclobenzaprine (FLEXERIL) 5 MG tablet Take 5-10 mg by mouth at bedtime.     FLUoxetine (PROZAC) 20 MG capsule Take 1 capsule (20 mg total) by mouth daily. Take with 40 mg capsule daily. 90 capsule 0   FLUoxetine (PROZAC) 40 MG capsule Take 1 capsule (40 mg total) by mouth daily. 90 capsule 0   ibuprofen (ADVIL) 600 MG tablet Take 1 tablet (600 mg total) by mouth every 6  (six) hours as needed. 60 tablet 0   lisdexamfetamine (VYVANSE) 20 MG capsule Take 1 capsule (20 mg total) by mouth daily. 30 capsule 0   MAGNESIUM GLUCONATE PO Take by mouth at bedtime.     NIKKI 3-0.02 MG tablet Take 1 tablet by mouth daily.     Prenatal Vit-Fe Fumarate-FA (PRENATAL MULTIVITAMIN) TABS tablet Take 1 tablet by mouth daily at 12 noon. 60 tablet 1   TURMERIC PO Take by mouth 2 (two) times daily.     No current facility-administered medications for this visit.    ROS: Review of Systems  Constitutional:  Positive for appetite change and unexpected weight change.  Endocrine: Positive for heat intolerance and polyphagia. Negative for cold intolerance.  Neurological:  Positive for headaches.  Psychiatric/Behavioral:  Negative for decreased concentration, dysphoric mood, hallucinations, self-injury, sleep disturbance and suicidal ideas. The patient is nervous/anxious.  Less obsessions and still no longer compulsions    Objective:  Psychiatric Specialty Exam: not currently breastfeeding.There is no height or weight on file to calculate BMI.  General Appearance: Neat, Well Groomed, and appears stated age  Eye Contact:  Good  Speech:  Clear and Coherent and Normal Rate  Volume:  Normal  Mood:   "Doing good"  Affect:  Appropriate, Congruent, Constricted, and overall still bright but anxious  Thought Content: Logical, Hallucinations: None, and Rumination on sugar cravings/weight number  Suicidal Thoughts:  No  Homicidal Thoughts:  No  Thought Process:  Coherent, Goal Directed, and Linear  Orientation:  Full (Time, Place, and Person)    Memory:  Grossly intact   Judgment:  Fair, difficulty with resisting binge on sugar  Insight:  Good  Concentration:  Concentration: Good  Recall:  not formally assessed   Fund of Knowledge: Good  Language: Good  Psychomotor Activity:  Normal  Akathisia:  No  AIMS (if indicated): not done  Assets:  Communication Skills Desire for  Improvement Financial Resources/Insurance Housing Intimacy Leisure Time Physical Health Resilience Social Support Talents/Skills Transportation Vocational/Educational  ADL's:  Intact  Cognition: WNL  Sleep:  Fair   PE: General: sits comfortably in view of camera; no acute distress  Pulm: no increased work of breathing on room air  MSK: all extremity movements appear intact  Neuro: no focal neurological deficits observed  Gait & Station: unable to assess by video    Metabolic Disorder Labs: No results found for: "HGBA1C", "MPG" No results found for: "PROLACTIN" Lab Results  Component Value Date   CHOL 174 01/10/2015   TRIG 102.0 01/10/2015   HDL 55.60 01/10/2015   CHOLHDL 3 01/10/2015   VLDL 20.4 01/10/2015   LDLCALC 98 01/10/2015   Lab Results  Component Value Date   TSH 1.24 12/26/2022   TSH 1.23 01/10/2015    Therapeutic Level Labs: No results found for: "LITHIUM" No results found for: "VALPROATE" No results found for: "CBMZ"  Screenings:  GAD-7    Flowsheet Row Office Visit from 04/16/2023 in [facility]  Total GAD-7 Score 19      PHQ2-9    Flowsheet Row Office Visit from 08/28/2023 in [facility] [place] Office Visit from 06[facility]03/2023 in [facility] [place] Office Visit from 04/16/2023 in [facility] Office Visit from 12/26/2022 in [facility] [place]  PHQ-2 Total Score 2 2 6  0  PHQ-9 Total Score 10 6 15  --      Flowsheet Row ED from 08/18/2023 in [facility]  ED from 07/09/2023 in [facility] Visit from 06/03/2023 in [facility] at [place]  C-SSRS RISK CATEGORY No Risk No Risk No Risk       Collaboration of Care: Collaboration of Care: Medication Management AEB as above, Primary Care Provider AEB as above, and  Referral or follow-up with counselor/therapist AEB as above  Patient/Guardian was advised Release of Information must be obtained prior to any record release in order to collaborate their care with an outside provider. Patient/Guardian was advised if they have not already done so to contact the registration department to sign all necessary forms in order for [place] to release information regarding their care.   Consent: Patient/Guardian gives verbal consent for treatment and assignment of benefits for services provided during this  visit. Patient/Guardian expressed understanding and agreed to proceed.   Televisit via video: I connected with patient on 11/24/23 at 11:30 AM EST by a video enabled telemedicine application and verified that I am speaking with the correct person using two identifiers.  Location: Patient: at work in [employer] Provider: remote office in [place]   I discussed the limitations of evaluation and management by telemedicine and the availability of in person appointments. The patient expressed understanding and agreed to proceed.  I discussed the assessment and treatment plan with the patient. The patient was provided an opportunity to ask questions and all were answered. The patient agreed with the plan and demonstrated an understanding of the instructions.   The patient was advised to call back or seek an in-person evaluation if the symptoms worsen or if the condition fails to improve as anticipated.  I provided 20 minutes of virtual face-to-face time during this encounter.  Elsie Lincoln, MD 11/24/2023, 11:52 AM

## 2023-11-24 NOTE — Patient Instructions (Signed)
We increased the dose of the Vyvanse to 20 mg once daily today.  Keep up the good work that you are doing in therapy and we will be on the lookout for the urine drug screen when you get a chance to come by the clinic.  Just swing by the front desk first and they can print off the order form and point you in the direction of the clinic.

## 2023-11-27 ENCOUNTER — Encounter: Payer: No Typology Code available for payment source | Attending: Primary Care | Admitting: Dietician

## 2023-11-27 ENCOUNTER — Encounter: Payer: Self-pay | Admitting: Dietician

## 2023-11-27 VITALS — Wt 153.0 lb

## 2023-11-27 DIAGNOSIS — F5081 Binge eating disorder, mild: Secondary | ICD-10-CM | POA: Diagnosis present

## 2023-11-27 DIAGNOSIS — F422 Mixed obsessional thoughts and acts: Secondary | ICD-10-CM | POA: Insufficient documentation

## 2023-11-27 DIAGNOSIS — Z6826 Body mass index (BMI) 26.0-26.9, adult: Secondary | ICD-10-CM | POA: Diagnosis not present

## 2023-11-27 NOTE — Patient Instructions (Addendum)
Limit snacking to defined area(s), ie avoid eating on couch Change routine of eating, whether time changes, or choices change, or avoiding snacks every day and keep for certain days or relying more on physical hunger.  Control food portions especially starchy foods (larger amounts lead to wider fluctuations in blood sugar and insulin which can increase cravings for sweets)

## 2023-11-27 NOTE — Progress Notes (Signed)
Medical Nutrition Therapy: Visit start time: 0830  end time: 0930  Assessment:   Referral Diagnosis: binge eating Other medical history/ diagnoses: none significant.  Psychosocial issues/ stress concerns: history of depression 04/2023  Medications, supplements: reconciled list in medical record   Current weight: 153lbs (patient reported) Height: 5'4" BMI: 26.26 Patient's personal weight goal: 125-130lbs  Progress and evaluation:  Patient reports bout of depression in May-June 2024, which led to some binge eating of sweets. She has seen a therapist and reports she is recovered from depression, but still dealing with craving and bingeing. Has rx for Vyvanse to hopefully help. Reports current weight of about 153lbs recently, up from 125-135 norm, mostly due to increase in consumption of sweets/ candy.   Has TMJ and was having severe headaches, started on gabapentin which she felt contributed to weight/ appetite.  Craves sweets mostly chocolate little debbie cherry cordial, milk duds, choc chip cookies throughout day Lives with spouse and 2 small children Food allergies: none Special diet practices: none Patient seeks help with controlling binge eating, losing weight back to her norm of 125-130lbs (about 25lbs)    Dietary Intake:  Usual eating pattern includes 2 meals and 3 snacks per day. Dining out frequency: 5 meals per week. Who plans meals/ buys groceries? self Who prepares meals? self  Breakfast: drinks coffee, no food Snack: none Lunch: varies -- [place] pita and fries; spicy chicken caesar salad; was bringing salad from home (no meat, not filling) Snack: candy, single snack box + cookies, other sweet before dinner Supper: usually cooks, plans menus; 12/11 chicken strips and fries Snack: candy/ sweets about 1 hr after dinner, kids in bed Beverages: water throughout day, less lately maybe 30oz (was drinking 100oz), coffee in am  Physical activity: none; works sedentary job full  time weekdays   Intervention:   Nutrition Care Education:   Basic nutrition: basic food groups; appropriate nutrient balance; appropriate meal and snack schedule; general nutrition guidelines    Weight control: importance of low sugar and low fat choices; appropriate food portions; estimated energy needs for weight loss at 1400 kcal, provided guidance for 2-3 servings (30-45g) CHO per meal, avg 3oz portions of lean meats/ proteins, generous portions of nonstarchy veg, limited amounts of added fats. Discussed intermittent fasting per patient request. Binge eating: managing food cravings with delay, distraction, distance; lower sugar/ calorie alternatives; changing routine of snacking at certain times and/or locations; discussed behavior chains; avoiding guilt/ shame; importance of balance, moderation in meals to help control appetite; finding alternative activities to eating, including physical activity/ exercise. Advised controlling portions of starchy foodsand including protein, small amounts of fat to avoid fluctuations in BGs and insulin levels   Other intervention notes: Patient voices readiness to work on manageable changes to control binge eating and lose weight. Listed goals developed with direction from patient.  Patient declined scheduling follow up MNT at this time but will schedule later if needed.   Nutritional Diagnosis:  NB-1.5 Disordered eating pattern As related to binge eating episodes.  As evidenced by large portions of sweet snacks one or more times daily. [place]-3.4 Unintentional weight gain As related to excess calories.  As evidenced by patient with current BMI of 26, with her norm at 21-22.   Education Materials given:  Tips for Insurance underwriter with food lists, sample meal pattern Visit summary with goals/ instructions to be viewed via patient portal   Learner/ who was taught:  Patient   Level of understanding: Verbalizes/  demonstrates  competency  Demonstrated degree of understanding via:   Teach back Learning barriers: None  Willingness to learn/ readiness for change: Acceptance, ready for change  Monitoring and Evaluation:  Dietary intake, exercise, and body weight      follow up: prn

## 2023-11-28 LAB — DRUG MONITOR, PANEL 1, SCREEN, URINE
Amphetamines: NEGATIVE ng/mL (ref <500)
Barbiturates: NEGATIVE ng/mL (ref <300)
Benzodiazepines: NEGATIVE ng/mL (ref <100)
Cocaine Metabolite: NEGATIVE ng/mL (ref <150)
Creatinine: 58.4 mg/dL (ref >=20.0)
Marijuana Metabolite: NEGATIVE ng/mL (ref <20)
Methadone Metabolite: NEGATIVE ng/mL (ref <100)
Opiates: NEGATIVE ng/mL (ref <100)
Oxidant: NEGATIVE ug/mL (ref <200)
Oxycodone: NEGATIVE ng/mL (ref <100)
Phencyclidine: NEGATIVE ng/mL (ref <25)
pH: 6.8 (ref 4.5–9.0)

## 2023-11-28 LAB — DM TEMPLATE

## 2023-12-01 ENCOUNTER — Encounter (HOSPITAL_COMMUNITY): Payer: Self-pay

## 2023-12-02 ENCOUNTER — Ambulatory Visit: Payer: No Typology Code available for payment source | Admitting: Clinical

## 2023-12-04 ENCOUNTER — Ambulatory Visit: Payer: No Typology Code available for payment source | Admitting: Clinical

## 2023-12-04 DIAGNOSIS — F422 Mixed obsessional thoughts and acts: Secondary | ICD-10-CM | POA: Diagnosis not present

## 2023-12-04 DIAGNOSIS — F411 Generalized anxiety disorder: Secondary | ICD-10-CM | POA: Diagnosis not present

## 2023-12-04 DIAGNOSIS — F330 Major depressive disorder, recurrent, mild: Secondary | ICD-10-CM | POA: Diagnosis not present

## 2023-12-04 NOTE — Progress Notes (Signed)
[facility] Counselor/Therapist Progress Note  Patient ID: Stacey Houston, MRN: [id number],    Date: 12/04/2023  Time Spent: 9:36am - 10:33am : 57 minutes   Treatment Type: Individual Therapy  Reported Symptoms: Patient reported worry and ruminating thoughts  Mental Status Exam: Appearance:  Neat and Well Groomed     Behavior: Appropriate  Motor: Normal  Speech/Language:  Clear and Coherent  Affect: Appropriate  Mood: normal  Thought process: normal  Thought content:   WNL  Sensory/Perceptual disturbances:   WNL  Orientation: oriented to person, place, and situation  Attention: Good  Concentration: Good  Memory: WNL  Fund of knowledge:  Good  Insight:   Good  Judgment:  Good  Impulse Control: Good   Risk Assessment: Danger to Self:  No Patient denied current suicidal ideation  Self-injurious Behavior: No Danger to Others: No Patient denied current homicidal ideation Duty to Warn:no Physical Aggression / Violence:No  Access to Firearms a concern: No  Gang Involvement:No   Subjective: Patient stated, "good" in response to events since last session. Patient reported during patient's recent appointment with the psychiatrist patient's medication, vyvanse, was increased to 20 mg. Patient reported she has had an appointment with a nutritionist since last session. Patient reported the nutritionist recommended patient eat in one room of her home, limit sweets, and take time to consider patient's decision when eating sweets. Patient reported two items on patient's list of home repairs has been completed. Patient reported her husband keeps his business papers on the kitchen table and patient stated, "it's bothering me". Patient stated,  "I worry if I let the papers sit there he is going to let them sit there". Patient reported she expressed her feelings to her husband regarding the papers remaining on the table. Patient reported her husband attempted to initiate  intimacy this morning and patient referred to the papers on the table in response. Patient acknowledged she was keeping score during patient/husband's conversation. Patient stated, "I feel like he doesn't try to compromise" in reference to the papers. Patient stated, "its not the papers that are so important, its that he knows I don't like them there". Patient reported she plans to initiate a conversation with her husband to discuss the papers on the table, patient's feelings, and potential resolutions. Patient stated,  "I have tried not to do that (keeping score) and I have been very good at that until today". Patient stated, "I had gotten very resentful of him (husband) because we talked about the list", "that has gotten a lot better for me".  Patient stated, "I do feel much more comfortable" in regard to initiating conversations with patient's husband. Patient stated, "my OCD is doing very well" and stated. "good" in response to patient's mood.   Interventions: Cognitive Behavioral Therapy and Interpersonal. Clinician conducted session in person at clinician's office at [facility]. Reviewed events since last session. Discussed recent appointment with psychiatrist and nutritionist. Explored and identified thoughts/feelings triggered by husband's papers remaining on the table. Discussed patient's recent conversation with husband regarding the papers and assisted patient in examining patient's communication with husband. Explored strategies to address patient's concerns regarding husband's papers, such as, keeping papers in a filing box, using a roll up mat, establishing an office space downstairs for husband. Provided psycho education related to fair fighting rules. Assessed patient's mood. Clinician requested patient continue thought record, practice utilizing socratic questions to challenge negative thoughts/thought distortions, maintain worry journal for homework, and practice identifying one  positive  for every negative thought patient experiences.   Collaboration of Care: Other not required at this time   Diagnosis:  Mixed obsessional thoughts and acts   Generalized anxiety disorder   Mild episode of recurrent major depressive disorder (HCC)     Plan: Patient is to utilize [employer] [facility], thought re-framing, relaxation techniques, mindfulness and coping strategies to decrease symptoms associated with their diagnosis. Frequency: bi-weekly  Modality: individual      Long-term goal:   Reduce overall level, frequency, and intensity of the feelings of depression and anxiety as evidenced by decrease obsessive thoughts, compulsions, worry, depressed mood from 6 to 7 days/week to 0 to 1 days/week per patient report for at least 3 consecutive months. Target Date: 07/30/24  Progress: progressing    Short-term goal:  Verbalize an understanding of the role that distorted thinking plays in creating fears, excessive worry, and ruminations. Target Date: 01/31/24  Progress: progressing    Reduce time spent completing compulsions from 1 hour per day during the week and 4 hours per day on the weekends Target Date: 01/31/24  Progress: progressing    Reduce impulse to immediately complete compulsions, such as, cleaning kitchen after meals, taking clothes out of dryer once dryer is complete, and picking up children's toys Target Date: 01/31/24  Progress: progressing    Develop and implement mindfulness exercises to increase patient's focus on being in the present  Target Date: 01/31/24  Progress: progressing    Identify, challenge, and replace negative thought patterns that contribute to feelings of depression, anxiety, and obsessive thoughts with positive thoughts and beliefs per patient's report Target Date: 01/31/24  Progress: progressing         Doree Barthel, LCSW

## 2023-12-04 NOTE — Progress Notes (Signed)
                Dezi Schaner, LCSW 

## 2023-12-16 ENCOUNTER — Ambulatory Visit: Payer: No Typology Code available for payment source | Admitting: Clinical

## 2023-12-16 DIAGNOSIS — F330 Major depressive disorder, recurrent, mild: Secondary | ICD-10-CM

## 2023-12-16 DIAGNOSIS — F422 Mixed obsessional thoughts and acts: Secondary | ICD-10-CM

## 2023-12-16 DIAGNOSIS — F411 Generalized anxiety disorder: Secondary | ICD-10-CM

## 2023-12-16 NOTE — Progress Notes (Signed)
 [facility] Counselor/Therapist Progress Note  Patient ID: Stacey Houston, MRN: [id number],    Date: 12/16/2023  Time Spent: 8:39am - 9:29am : 50 minutes   Treatment Type: Individual Therapy  Reported Symptoms: Patient reported a recent decline in mood  Mental Status Exam: Appearance:  Neat and Well Groomed     Behavior: Appropriate  Motor: Normal  Speech/Language:  Clear and Coherent  Affect: Appropriate  Mood: normal  Thought process: normal  Thought content:   WNL  Sensory/Perceptual disturbances:   WNL  Orientation: oriented to person, place, and situation  Attention: Good  Concentration: Good  Memory: WNL  Fund of knowledge:  Good  Insight:   Good  Judgment:  Good  Impulse Control: Good   Risk Assessment: Danger to Self:  No Patient denied current suicidal ideation  Self-injurious Behavior: No Danger to Others: No Patient denied current homicidal ideation Duty to Warn:no Physical Aggression / Violence:No  Access to Firearms a concern: No  Gang Involvement:No   Subjective: Patient reported she has not initiated a conversation with her husband to discuss the papers on the table. Patient stated, I was in a funky mood right before Christmas. Patient reported she discontinued the vyvanse  to determine if the medication was impacting patient's mood. Patient reported while taking vyvanse  patient noted a decrease in the urge to snack and a decrease in cravings for sugar. Patient stated, I always have in the back of my head that's the straw that broke the camels back, I don't want to be in that place again in reference to a previous depressive episode. Patient stated, I think I would have felt stressed out and anxious anyway if I wasn't taking the vyvanse . Patient reported she experienced interrupted sleep while taking vyvanse . Patient reported due to recent changes in patient's schedule she took the vyvanse  later in the morning. Patient stated, I probably  would have been in those moods any ways.  Patient reported patient's family typically goes to patient's grandfather's home for Christmas and reported the visit looked different this year due to grandfather's health and grandfather receiving hospice care. Patient stated, I think we put too much on our plate in reference to patient/family's holiday plans. Patient stated, he's himself but he's not in reference to patient's grandfather. Patient stated, I think that's been happening a lot as well in regards to anticipatory grief. Patient reported she is unhappy with her weight and stated, that bothers me. Patient stated,  I did a lot of that (challenging cognitive distortions). Patient reported she noted a decline in mood and increase in symptoms of OCD when theres a lot of things happening out of the normal that I can't control. Patient stated, I see a whole bunch of things that need improvement in response to patient's perception of herself.  Patient stated, lately I have not been good at that in regard to identifying one positive for every negative thought. Patient stated, that's always been very hard for me in reference to identifying positive qualities about herself. Patient stated, I feel like I'm positive in response to current mood.   Interventions: Cognitive Behavioral Therapy. Clinician conducted session in person at clinician's office at [facility]. Reviewed events since last session. Explored and identified barriers to initiating a conversation with husband. Explored patient's concerns regarding vyvanse  and the impact on patient's mood. Explored and identified triggers for recent decline in mood. Provided psycho education related to anticipatory grief. Reviewed challenging cognitive distortions. Explored patient's perception of herself. Reviewed  patient's homework. Challenged cognitive distortions and assisted patient in reframing situations. Assessed current mood. Clinician  requested patient continue thought record, practice utilizing socratic questions to challenge negative thoughts/thought distortions, maintain worry journal for homework, and practice identifying one positive for every negative thought patient experiences. In addition, clinician requested patient complete a positive qualities survey for homework.     Collaboration of Care: Other not required at this time   Diagnosis:  Mixed obsessional thoughts and acts   Generalized anxiety disorder   Mild episode of recurrent major depressive disorder (HCC)     Plan: Patient is to utilize [employer] [facility], thought re-framing, relaxation techniques, mindfulness and coping strategies to decrease symptoms associated with their diagnosis. Frequency: bi-weekly  Modality: individual      Long-term goal:   Reduce overall level, frequency, and intensity of the feelings of depression and anxiety as evidenced by decrease obsessive thoughts, compulsions, worry, depressed mood from 6 to 7 days/week to 0 to 1 days/week per patient report for at least 3 consecutive months. Target Date: 07/30/24  Progress: progressing    Short-term goal:  Verbalize an understanding of the role that distorted thinking plays in creating fears, excessive worry, and ruminations. Target Date: 01/31/24  Progress: progressing    Reduce time spent completing compulsions from 1 hour per day during the week and 4 hours per day on the weekends Target Date: 01/31/24  Progress: progressing    Reduce impulse to immediately complete compulsions, such as, cleaning kitchen after meals, taking clothes out of dryer once dryer is complete, and picking up children's toys Target Date: 01/31/24  Progress: progressing    Develop and implement mindfulness exercises to increase patient's focus on being in the present  Target Date: 01/31/24  Progress: progressing    Identify, challenge, and replace negative thought patterns that contribute to  feelings of depression, anxiety, and obsessive thoughts with positive thoughts and beliefs per patient's report Target Date: 01/31/24  Progress: progressing       Darice Seats, LCSW

## 2023-12-16 NOTE — Progress Notes (Signed)
   Stacey Barthel, LCSW

## 2023-12-22 ENCOUNTER — Encounter (HOSPITAL_COMMUNITY): Payer: Self-pay | Admitting: Psychiatry

## 2023-12-22 ENCOUNTER — Telehealth (HOSPITAL_COMMUNITY): Payer: No Typology Code available for payment source | Admitting: Psychiatry

## 2023-12-22 DIAGNOSIS — F422 Mixed obsessional thoughts and acts: Secondary | ICD-10-CM

## 2023-12-22 DIAGNOSIS — F5081 Binge eating disorder, mild: Secondary | ICD-10-CM

## 2023-12-22 DIAGNOSIS — F411 Generalized anxiety disorder: Secondary | ICD-10-CM

## 2023-12-22 DIAGNOSIS — Z79899 Other long term (current) drug therapy: Secondary | ICD-10-CM

## 2023-12-22 DIAGNOSIS — F330 Major depressive disorder, recurrent, mild: Secondary | ICD-10-CM

## 2023-12-22 MED ORDER — LISDEXAMFETAMINE DIMESYLATE 20 MG PO CAPS: 20.0000 mg | ORAL_CAPSULE | Freq: Every day | ORAL | 0 refills | Status: DC

## 2023-12-22 NOTE — Patient Instructions (Signed)
 We did not make any medication changes today.  Keep up the good work with some of the behavioral changes that we have been talking about and the nutritionist pointers that she gave you.

## 2023-12-22 NOTE — Progress Notes (Signed)
 BH MD Outpatient Progress Note  12/22/2023 3:52 PM Stacey Houston  MRN:  [id number]  Assessment:  Stacey Houston presents for follow-up evaluation. Today, 12/22/23, patient reports improvement to OCD symptoms with combination of Prozac  and psychotherapy now able to resist the compulsion to clean with the exception of recent titration of Vyvanse .  Overall, the obsessive thoughts continue to be less frequent.  Headaches have also stayed relatively minimal despite coming off of gabapentin and starting Vyvanse .  The 20 mg dose of Vyvanse  has been effective for helping to curb compulsive eating of sugary snacks and reducing cravings.  She will come by the clinic to get a urine toxicology screen before next appointment.  As above the Vyvanse  did cause a brief worsening of the OCD but given that she is failed naltrexone  and Topamax  due to side effects there are fairly limited options left to address outside of behavioral changes which have been bolstered by seeing the nutritionist.  She continues in psychotherapy and has made progress there to every other week.  Follow-up in 12 weeks.   For safety, her acute risk factors for suicide are: Current diagnosis of OCD, newborn in the home.  Her chronic risk factors for suicide are: Chronic mental health concerns, access to firearms.  Her protective factors are: Minor children living in the home, supportive family and friends, employment beloved pets, actively seeking and engaging with mental health care, no suicidal ideation, guns are secured in a gun safe.  While future events cannot be fully predicted she does not currently meet IVC criteria and can be continued as an outpatient.  Identifying Information: Stacey Houston is a 38 y.o. female with a history of OCD, generalized anxiety disorder, postpartum depression with worsening of OCD, recurrent major depressive disorder, chronic headaches who is an established patient with [facility] participating in follow-up via video conferencing. Initial evaluation of OCD on 06/03/2023; please see that note for full case formulation.  As discussed in my chart messages the episodes with weight gain does coincide with stopping Topamax  so most likely stopping the Topamax  uncovered a binge eating disorder rather than a medication side effect from Prozac  which should be helpful with impulsive eating.     Plan:   # OCD  generalized anxiety disorder Past medication trials:  Status of problem: improving Interventions: -- Continue Prozac  60 mg once daily (s6/18/24, i7/16/24, i8/19/24, i10/4/24) -- Continue psychotherapy   # Mild recurrent major depressive disorder Past medication trials:  Status of problem: Improving Interventions: -- Prozac , psychotherapy as above  # Binge eating disorder, mild  On stimulant medication Past medication trials:  Status of problem: improving Interventions: -- Continue vyvanse  20mg  once daily (s11/5/24, i12/9/24) -- Coordinate with PCP for nutrition referral -- urine drug screen before next appointment   # Chronic headaches Past medication trials: Topamax , amitriptyline  Status of problem: improving Interventions: --Coordinate with PCP for neurology referral  Patient was given contact information for behavioral health clinic and was instructed to call 911 for emergencies.   Subjective:  Chief Complaint:  Chief Complaint  Patient presents with   OCD   Anxiety   Depression   Eating Disorder   Follow-up    Interval History: Things have been good since last appointment. Can tell the 20mg  dose of vyvanse  is helping with the cravings for sweets. Hasn't been sleeping as well with the increased dose and is going to try and get used to it. Can get to sleep but will wake  up at 1:45a and will stay awake for awhile at a time. Has tried to start the keto diet but is not planning on being super strict. Has not been eating candy and has been working  out again. Still doing just lunch and dinner but will have all 3 on the weekends. Did see the nutritionist and got some good pointers. No headaches/chest pain but has had slight worsening of OCD with urge to clean but able to redirect. Mealtime plate is about half filled at this point, no weight loss. Happy with progress of treatment. Will still have a tall Yeti of coffee until lunch time as only food (except on Sundays when having a bigger breakfast).   Visit Diagnosis:    ICD-10-CM   1. Binge eating disorder, mild  F50.810 lisdexamfetamine (VYVANSE ) 20 MG capsule    lisdexamfetamine (VYVANSE ) 20 MG capsule    lisdexamfetamine (VYVANSE ) 20 MG capsule    2. Mixed obsessional thoughts and acts  F42.2     3. Generalized anxiety disorder  F41.1     4. Mild episode of recurrent major depressive disorder (HCC)  F33.0     5. On stimulant medication  Z79.899        Past Psychiatric History:  Diagnoses: OCD, generalized anxiety disorder, postpartum depression with worsening of OCD, recurrent major depressive disorder, binge eating disorder Medication trials: topamax  (effective for headache), amitriptyline  (ineffective for headache), fluoxetine  (effective but possible weight gain) Previous psychiatrist/therapist: yes to therapy Hospitalizations: none Suicide attempts: none SIB: none Hx of violence towards others: none Current access to guns: yes secured in gun safe Hx of trauma/abuse: none Substance use: none  Past Medical History:  Past Medical History:  Diagnosis Date   Adjustment reaction with anxiety and depression 12/26/2022   Anemia    Anxiety    Postpartum   Chronic pelvic pain in female    Constipation 01/10/2015   Depression    Postpartum   H/O chronic cystitis    History of recurrent UTIs    History of urethral stricture    Nephrolithiasis    bilateral non-obstructive per urologist note 06-16-2017  -- dr cam   Normal labor 11/10/2018   Pregnancy 12/11/2021    Retained placenta 11/11/2018   Rib pain 12/26/2022    Past Surgical History:  Procedure Laterality Date   AUGMENTATION MAMMAPLASTY Bilateral 2009   CHROMOPERTUBATION N/A 08/12/2017   Procedure: CHROMOPERTUBATION;  Surgeon: Dannielle Bouchard, DO;  Location: [facility];  Service: Gynecology;  Laterality: N/A;   CYSTO/  URETHRAL DILATION  11/09/2004   DILATION AND EVACUATION N/A 11/11/2018   Procedure: DILATATION AND EVACUATION;  Surgeon: Latisha Medford, MD;  Location: [facility];  Service: Gynecology;  Laterality: N/A;   LAPAROSCOPY N/A 08/12/2017   Procedure: LAPAROSCOPY DIAGNOSTIC [place] OF ENDOMETRIOSI;  Surgeon: Dannielle Bouchard, DO;  Location: [facility];  Service: Gynecology;  Laterality: N/A;   WISDOM TOOTH EXTRACTION  03/2016    Family Psychiatric History: father history of alcoholism, uncle died from alcoholism, mother alcoholism   Family History:  Family History  Problem Relation Age of Onset   Diabetes Maternal Grandmother    Diabetes Maternal Grandfather    Cancer Paternal Grandmother    Diabetes Paternal Grandmother    Cancer Paternal Grandfather    Diabetes Paternal Grandfather     Social History:  Academic/Vocational: Employed  Social History   Socioeconomic History   Marital status: Married    Spouse name: Not on file   Number  of children: Not on file   Years of education: Not on file   Highest education level: Not on file  Occupational History   Not on file  Tobacco Use   Smoking status: Never   Smokeless tobacco: Never  Vaping Use   Vaping status: Never Used  Substance and Sexual Activity   Alcohol use: Not Currently   Drug use: Never   Sexual activity: Yes    Birth control/protection: OCP  Other Topics Concern   Not on file  Social History Narrative   Not on file   Social Drivers of Health   Financial Resource Strain: Not on file  Food Insecurity: Not on file  Transportation Needs: Not on file   Physical Activity: Not on file  Stress: Not on file  Social Connections: Not on file    Allergies:  Allergies  Allergen Reactions   Amoxicillin Hives    Has patient had a PCN reaction causing immediate rash, facial/tongue/throat swelling, SOB or lightheadedness with hypotension: Yes Has patient had a PCN reaction causing severe rash involving mucus membranes or skin necrosis: Yes Has patient had a PCN reaction that required hospitalization: No Has patient had a PCN reaction occurring within the last 10 years: No If all of the above answers are NO, then may proceed with Cephalosporin use.   Penicillins Hives    Has patient had a PCN reaction causing immediate rash, facial/tongue/throat swelling, SOB or lightheadedness with hypotension: Yes Has patient had a PCN reaction causing severe rash involving mucus membranes or skin necrosis: Yes Has patient had a PCN reaction that required hospitalization: No Has patient had a PCN reaction occurring within the last 10 years: No If all of the above answers are NO, then may proceed with Cephalosporin use.    Sulfa Antibiotics Hives    Current Medications: Current Outpatient Medications  Medication Sig Dispense Refill   [START ON 01/20/2024] lisdexamfetamine (VYVANSE ) 20 MG capsule Take 1 capsule (20 mg total) by mouth daily. 30 capsule 0   [START ON 02/19/2024] lisdexamfetamine (VYVANSE ) 20 MG capsule Take 1 capsule (20 mg total) by mouth daily. 30 capsule 0   ASHWAGANDHA GUMMIES PO Take by mouth daily.     cyclobenzaprine  (FLEXERIL ) 5 MG tablet Take 5-10 mg by mouth at bedtime.     FLUoxetine  (PROZAC ) 20 MG capsule Take 1 capsule (20 mg total) by mouth daily. Take with 40 mg capsule daily. 90 capsule 0   FLUoxetine  (PROZAC ) 40 MG capsule Take 1 capsule (40 mg total) by mouth daily. 90 capsule 0   ibuprofen  (ADVIL ) 600 MG tablet Take 1 tablet (600 mg total) by mouth every 6 (six) hours as needed. 60 tablet 0   lisdexamfetamine (VYVANSE ) 20 MG  capsule Take 1 capsule (20 mg total) by mouth daily. 30 capsule 0   MAGNESIUM GLUCONATE PO Take by mouth at bedtime.     NIKKI 3-0.02 MG tablet Take 1 tablet by mouth daily.     Prenatal Vit-Fe Fumarate-FA (PRENATAL MULTIVITAMIN) TABS tablet Take 1 tablet by mouth daily at 12 noon. 60 tablet 1   No current facility-administered medications for this visit.    ROS: Review of Systems  Constitutional:  Negative for appetite change and unexpected weight change.  Endocrine: Positive for heat intolerance. Negative for cold intolerance and polyphagia.  Neurological:  Positive for headaches.  Psychiatric/Behavioral:  Positive for sleep disturbance. Negative for decreased concentration, dysphoric mood, hallucinations, self-injury and suicidal ideas. The patient is nervous/anxious.  Less obsessions and still no longer compulsions    Objective:  Psychiatric Specialty Exam: There were no vitals taken for this visit.There is no height or weight on file to calculate BMI.  General Appearance: Neat, Well Groomed, and appears stated age  Eye Contact:  Good  Speech:  Clear and Coherent and Normal Rate  Volume:  Normal  Mood:   Doing good just some slight worsening of sleep  Affect:  Appropriate, Congruent, Constricted, and overall still bright but anxious  Thought Content: Logical, Hallucinations: None, and Rumination on sugar cravings/weight number but improving  Suicidal Thoughts:  No  Homicidal Thoughts:  No  Thought Process:  Coherent, Goal Directed, and Linear  Orientation:  Full (Time, Place, and Person)    Memory:  Grossly intact   Judgment:  Fair, difficulty with resisting binge on sugar but improving  Insight:  Good  Concentration:  Concentration: Good  Recall:  not formally assessed   Fund of Knowledge: Good  Language: Good  Psychomotor Activity:  Normal  Akathisia:  No  AIMS (if indicated): not done  Assets:  Communication Skills Desire for Improvement Financial  Resources/Insurance Housing Intimacy Leisure Time Physical Health Resilience Social Support Talents/Skills Transportation Vocational/Educational  ADL's:  Intact  Cognition: WNL  Sleep:  Fair   PE: General: sits comfortably in view of camera; no acute distress  Pulm: no increased work of breathing on room air  MSK: all extremity movements appear intact  Neuro: no focal neurological deficits observed  Gait & Station: unable to assess by video    Metabolic Disorder Labs: No results found for: HGBA1C, MPG No results found for: PROLACTIN Lab Results  Component Value Date   CHOL 174 01/10/2015   TRIG 102.0 01/10/2015   HDL 55.60 01/10/2015   CHOLHDL 3 01/10/2015   VLDL 20.4 01/10/2015   LDLCALC 98 01/10/2015   Lab Results  Component Value Date   TSH 1.24 12/26/2022   TSH 1.23 01/10/2015    Therapeutic Level Labs: No results found for: LITHIUM No results found for: VALPROATE No results found for: CBMZ  Screenings:  GAD-7    Flowsheet Row Office Visit from 04/16/2023 in [facility]  Total GAD-7 Score 19      PHQ2-9    Flowsheet Row Nutrition from 11/27/2023 in [facility] Visit from 08/28/2023 in [facility] [place] Office Visit from 06/03/2023 in [facility] [place] Office Visit from 04/16/2023 in [facility] Office Visit from 12/26/2022 in [facility] [place]  PHQ-2 Total Score 0 2 2 6  0  PHQ-9 Total Score -- 10 6 15  --      Flowsheet Row ED from 08/18/2023 in [facility]  ED from 07/09/2023 in [facility] Visit from 06/03/2023 in [facility] at [place]  C-SSRS RISK CATEGORY No Risk No Risk No Risk       Collaboration of Care: Collaboration of  Care: Medication Management AEB as above, Primary Care Provider AEB as above, and Referral or follow-up with counselor/therapist AEB as above  Patient/Guardian was advised Release of Information must be obtained prior to any record release in order to collaborate their care with an outside provider. Patient/Guardian was advised if they have not already done so to contact the registration department to sign  all necessary forms in order for us  to release information regarding their care.   Consent: Patient/Guardian gives verbal consent for treatment and assignment of benefits for services provided during this visit. Patient/Guardian expressed understanding and agreed to proceed.   Televisit via video: I connected with patient on 12/22/23 at  3:30 PM EST by a video enabled telemedicine application and verified that I am speaking with the correct person using two identifiers.  Location: Patient: at work in [employer] Provider: remote office in [place]   I discussed the limitations of evaluation and management by telemedicine and the availability of in person appointments. The patient expressed understanding and agreed to proceed.  I discussed the assessment and treatment plan with the patient. The patient was provided an opportunity to ask questions and all were answered. The patient agreed with the plan and demonstrated an understanding of the instructions.   The patient was advised to call back or seek an in-person evaluation if the symptoms worsen or if the condition fails to improve as anticipated.  I provided 20 minutes of virtual face-to-face time during this encounter.  Jayson DELENA Peel, MD 12/22/2023, 3:52 PM

## 2024-01-01 ENCOUNTER — Ambulatory Visit: Payer: No Typology Code available for payment source | Admitting: Clinical

## 2024-01-01 ENCOUNTER — Ambulatory Visit (INDEPENDENT_AMBULATORY_CARE_PROVIDER_SITE_OTHER): Payer: No Typology Code available for payment source | Admitting: Clinical

## 2024-01-01 DIAGNOSIS — F422 Mixed obsessional thoughts and acts: Secondary | ICD-10-CM

## 2024-01-01 DIAGNOSIS — F411 Generalized anxiety disorder: Secondary | ICD-10-CM | POA: Diagnosis not present

## 2024-01-01 DIAGNOSIS — F330 Major depressive disorder, recurrent, mild: Secondary | ICD-10-CM

## 2024-01-01 NOTE — Progress Notes (Signed)
                Dezi Schaner, LCSW 

## 2024-01-01 NOTE — Progress Notes (Signed)
[facility] Counselor/Therapist Progress Note  Patient ID: Stacey Houston, MRN: [id number],    Date: 01/01/2024  Time Spent: 8:36am - 9:24am : 48 minutes   Treatment Type: Individual Therapy  Reported Symptoms: Patient reported obsessive thoughts, difficulty staying asleep  Mental Status Exam: Appearance:  Neat and Well Groomed     Behavior: Appropriate  Motor: Normal  Speech/Language:  Clear and Coherent  Affect: Appropriate  Mood: normal  Thought process: normal  Thought content:   WNL  Sensory/Perceptual disturbances:   WNL  Orientation: oriented to person, place, and situation  Attention: Good  Concentration: Good  Memory: WNL  Fund of knowledge:  Good  Insight:   Good  Judgment:  Good  Impulse Control: Good   Risk Assessment: Danger to Self:  No Patient denied current suicidal ideation  Self-injurious Behavior: No Danger to Others: No Patient denied current homicidal ideation Duty to Warn:no Physical Aggression / Violence:No  Access to Firearms a concern: No  Gang Involvement:No   Subjective: Patient stated, "they've been good" in response to events since last session. Patient reported on January 1st patient started a keto diet and stated, "I've done really well, I haven't cheated at all". Patient stated, "I feel better just knowing that I'm eating correctly". Patient stated, "switching gears and eating healthy and stopping the binging has helped me to be in a better mood". Patient reported she has not experienced binge eating behaviors since last session. Patient stated, "I feel like it makes me feel like I have myself more together". Patient stated, "I'm going to try to discipline myself to get up before work and get in a workout". Patient stated,  "there's been a few times where I've been aggravated that I haven't lost more than 1 pound on keto", "I did start to get flustered that nothing else has been done on my list". Patient reported she had a  conversation with her husband to discuss husband's papers on the table and patient reported she requested husband remove his papers from the table on Saturdays/Sundays while patient is home. Patient reported her husband moved his papers in response to their conversation.  Patient reported she resumed taking vyvanse and stated, "I've been doing really well on it", "I do think it may hinder my OCD a little bit". Patient reported an increase in obsessive thoughts since resuming the vyvanse and stated, "they're not as bad as they worse, they don't control me or make me as upset as they use to" in reference to obsessive thoughts. Patient reported she has been utilizing coping strategies discussed in therapy in response to obsessive thoughts. Patient stated,  "I have been very aware of not doing that" in reference to compulsions. Patient reported waking up in the early morning hours and experiencing difficulty returning to sleep.   Interventions: Cognitive Behavioral Therapy. Clinician conducted session via caregility video from clinician's office at [facility]. Patient provided verbal consent to proceed with telehealth session and is aware of limitations of telephone or video visits. Patient participated in session from patient's home. Reviewed events since last session. Reviewed patient's thought record. Discussed recent dietary changes and the impact on patient's mood. Assessed patient's mood since last session and current mood. Discussed patient's recent conversation with her husband and the outcome. Discussed patient's decision to resume vyvanse and explored patient's concerns regarding the impact on symptoms of OCD. Reviewed coping strategies patient has utilized in response to obsessive thoughts. Discussed patient's concerns related to difficulty staying asleep. Provided  psycho education related to sleep and physical activity. Provided reflective listening, validation, and praised patient for  challenging obsessive thoughts. Clinician requested patient continue thought record, practice utilizing socratic questions to challenge negative thoughts/thought distortions, maintain worry journal for homework, and practice identifying one positive for every negative thought patient experiences.      Collaboration of Care: Other not required at this time   Diagnosis:  Mixed obsessional thoughts and acts   Generalized anxiety disorder   Mild episode of recurrent major depressive disorder (HCC)     Plan: Patient is to utilize [employer] [facility], thought re-framing, relaxation techniques, mindfulness and coping strategies to decrease symptoms associated with their diagnosis. Frequency: bi-weekly  Modality: individual      Long-term goal:   Reduce overall level, frequency, and intensity of the feelings of depression and anxiety as evidenced by decrease obsessive thoughts, compulsions, worry, depressed mood from 6 to 7 days/week to 0 to 1 days/week per patient report for at least 3 consecutive months. Target Date: 07/30/24  Progress: progressing    Short-term goal:  Verbalize an understanding of the role that distorted thinking plays in creating fears, excessive worry, and ruminations. Target Date: 01/31/24  Progress: progressing    Reduce time spent completing compulsions from 1 hour per day during the week and 4 hours per day on the weekends Target Date: 01/31/24  Progress: progressing    Reduce impulse to immediately complete compulsions, such as, cleaning kitchen after meals, taking clothes out of dryer once dryer is complete, and picking up children's toys Target Date: 01/31/24  Progress: progressing    Develop and implement mindfulness exercises to increase patient's focus on being in the present  Target Date: 01/31/24  Progress: progressing    Identify, challenge, and replace negative thought patterns that contribute to feelings of depression, anxiety, and obsessive  thoughts with positive thoughts and beliefs per patient's report Target Date: 01/31/24  Progress: progressing     Doree Barthel, LCSW

## 2024-01-07 ENCOUNTER — Ambulatory Visit (INDEPENDENT_AMBULATORY_CARE_PROVIDER_SITE_OTHER): Payer: No Typology Code available for payment source | Admitting: Primary Care

## 2024-01-07 ENCOUNTER — Other Ambulatory Visit: Payer: Self-pay | Admitting: Primary Care

## 2024-01-07 ENCOUNTER — Encounter: Payer: Self-pay | Admitting: Primary Care

## 2024-01-07 VITALS — BP 108/76 | HR 94 | Temp 97.5°F | Ht 64.0 in | Wt 151.0 lb

## 2024-01-07 DIAGNOSIS — F411 Generalized anxiety disorder: Secondary | ICD-10-CM

## 2024-01-07 DIAGNOSIS — F5081 Binge eating disorder, mild: Secondary | ICD-10-CM | POA: Diagnosis not present

## 2024-01-07 DIAGNOSIS — Z13220 Encounter for screening for lipoid disorders: Secondary | ICD-10-CM | POA: Diagnosis not present

## 2024-01-07 DIAGNOSIS — Z0000 Encounter for general adult medical examination without abnormal findings: Secondary | ICD-10-CM | POA: Diagnosis not present

## 2024-01-07 DIAGNOSIS — F330 Major depressive disorder, recurrent, mild: Secondary | ICD-10-CM | POA: Diagnosis not present

## 2024-01-07 DIAGNOSIS — G8929 Other chronic pain: Secondary | ICD-10-CM | POA: Diagnosis not present

## 2024-01-07 DIAGNOSIS — R519 Headache, unspecified: Secondary | ICD-10-CM

## 2024-01-07 DIAGNOSIS — M26629 Arthralgia of temporomandibular joint, unspecified side: Secondary | ICD-10-CM

## 2024-01-07 DIAGNOSIS — R5383 Other fatigue: Secondary | ICD-10-CM

## 2024-01-07 DIAGNOSIS — D72829 Elevated white blood cell count, unspecified: Secondary | ICD-10-CM

## 2024-01-07 DIAGNOSIS — F422 Mixed obsessional thoughts and acts: Secondary | ICD-10-CM

## 2024-01-07 LAB — CBC
HCT: 41.1 % (ref 36.0–46.0)
Hemoglobin: 13.6 g/dL (ref 12.0–15.0)
MCHC: 33.2 g/dL (ref 30.0–36.0)
MCV: 91.3 fL (ref 78.0–100.0)
Platelets: 358 10*3/uL (ref 150.0–400.0)
RBC: 4.5 Mil/uL (ref 3.87–5.11)
RDW: 12.7 % (ref 11.5–15.5)
WBC: 15.2 10*3/uL — ABNORMAL HIGH (ref 4.0–10.5)

## 2024-01-07 LAB — COMPREHENSIVE METABOLIC PANEL
ALT: 15 U/L (ref 0–35)
AST: 13 U/L (ref 0–37)
Albumin: 4.3 g/dL (ref 3.5–5.2)
Alkaline Phosphatase: 63 U/L (ref 39–117)
BUN: 11 mg/dL (ref 6–23)
CO2: 29 meq/L (ref 19–32)
Calcium: 9.3 mg/dL (ref 8.4–10.5)
Chloride: 101 meq/L (ref 96–112)
Creatinine, Ser: 0.7 mg/dL (ref 0.40–1.20)
GFR: 111.9 mL/min (ref >60.00)
Glucose, Bld: 85 mg/dL (ref 70–99)
Potassium: 3.8 meq/L (ref 3.5–5.1)
Sodium: 137 meq/L (ref 135–145)
Total Bilirubin: 0.4 mg/dL (ref 0.2–1.2)
Total Protein: 7.2 g/dL (ref 6.0–8.3)

## 2024-01-07 LAB — LIPID PANEL
Cholesterol: 181 mg/dL (ref 0–200)
HDL: 76.8 mg/dL (ref >39.00)
LDL Cholesterol: 82 mg/dL (ref 0–99)
NonHDL: 104.27
Total CHOL/HDL Ratio: 2
Triglycerides: 111 mg/dL (ref 0.0–149.0)
VLDL: 22.2 mg/dL (ref 0.0–40.0)

## 2024-01-07 LAB — VITAMIN D 25 HYDROXY (VIT D DEFICIENCY, FRACTURES): VITD: 30.71 ng/mL (ref 30.00–100.00)

## 2024-01-07 LAB — VITAMIN B12: Vitamin B-12: 246 pg/mL (ref 211–911)

## 2024-01-07 LAB — TSH: TSH: 1.75 u[IU]/mL (ref 0.35–5.50)

## 2024-01-07 NOTE — Assessment & Plan Note (Signed)
Improved!  Continue fluoxetine 60 mg daily. Following with therapy and psychiatry.

## 2024-01-07 NOTE — Assessment & Plan Note (Signed)
Following with TMJ specialist.  Continue cyclobenzaprine 5 mg PRN.

## 2024-01-07 NOTE — Progress Notes (Signed)
Subjective:    Patient ID: Stacey Houston, female    DOB: 02-08-1986, 38 y.o.   MRN: [id number]  HPI  Stacey Houston is a very pleasant 38 y.o. female with a history of GAD, MDD, binge eating disorder who presents today for complete physical and follow up of chronic conditions.   She would also like to discuss fatigue and weight gain.  Following with psychiatry and is managed on fluoxetine 60 mg daily, Vyvanse 20 mg daily. She was placed on Vyvanse for binge eating disorder, doing well on this.   She has been on a Keto diet for the last 4 weeks but her weight has not reduced which has caused frustration. She is exercising three days weekly.  Diet currently consists of:  Breakfast: Skips, Coffee Lunch: Salad Dinner: Meat, veggies Snacks: Fruit, sugar free chocolates Desserts: sugar free chocolates  Beverages: Water  Immunizations: -Tetanus: Completed in 2022 -Influenza: Declines influenza vaccine.  Eye exam: Completes annually  Dental exam: Completes semi-annually    Pap Smear: Follows with GYN  BP Readings from Last 3 Encounters:  01/07/24 108/76  08/28/23 112/76  08/18/23 110/73   Wt Readings from Last 3 Encounters:  01/07/24 151 lb (68.5 kg)  11/27/23 153 lb (69.4 kg)  08/28/23 145 lb (65.8 kg)        Review of Systems  Constitutional:  Negative for unexpected weight change.  HENT:  Positive for sinus pressure. Negative for rhinorrhea.   Respiratory:  Negative for cough and shortness of breath.   Cardiovascular:  Negative for chest pain.  Gastrointestinal:  Negative for constipation and diarrhea.  Genitourinary:  Negative for difficulty urinating and menstrual problem.  Musculoskeletal:  Negative for arthralgias and myalgias.  Skin:  Negative for rash.  Allergic/Immunologic: Negative for environmental allergies.  Neurological:  Negative for dizziness, numbness and headaches.  Psychiatric/Behavioral:  The patient is not nervous/anxious.           Past Medical History:  Diagnosis Date   Adjustment reaction with anxiety and depression 12/26/2022   Anemia    Anxiety    Postpartum   Chronic pelvic pain in female    Constipation 01/10/2015   Depression    Postpartum   H/O chronic cystitis    History of recurrent UTIs    History of urethral stricture    Nephrolithiasis    bilateral non-obstructive per urologist note 06-16-2017  -- dr Marlou Porch   Normal labor 11/10/2018   Pregnancy 12/11/2021   Retained placenta 11/11/2018   Rib pain 12/26/2022    Social History   Socioeconomic History   Marital status: Married    Spouse name: Not on file   Number of children: Not on file   Years of education: Not on file   Highest education level: Not on file  Occupational History   Not on file  Tobacco Use   Smoking status: Never   Smokeless tobacco: Never  Vaping Use   Vaping status: Never Used  Substance and Sexual Activity   Alcohol use: Not Currently   Drug use: Never   Sexual activity: Yes    Birth control/protection: OCP  Other Topics Concern   Not on file  Social History Narrative   Not on file   Social Drivers of Health   Financial Resource Strain: Not on file  Food Insecurity: Not on file  Transportation Needs: Not on file  Physical Activity: Not on file  Stress: Not on file  Social Connections: Not on file  Intimate Partner Violence: Not on file    Past Surgical History:  Procedure Laterality Date   AUGMENTATION MAMMAPLASTY Bilateral 2009   CHROMOPERTUBATION N/A 08/12/2017   Procedure: CHROMOPERTUBATION;  Surgeon: Mitchel Honour, DO;  Location: [facility];  Service: Gynecology;  Laterality: N/A;   CYSTO/  URETHRAL DILATION  11/09/2004   DILATION AND EVACUATION N/A 11/11/2018   Procedure: DILATATION AND EVACUATION;  Surgeon: Zelphia Cairo, MD;  Location: [facility];  Service: Gynecology;  Laterality: N/A;   LAPAROSCOPY N/A 08/12/2017   Procedure: LAPAROSCOPY DIAGNOSTIC  [place] OF ENDOMETRIOSI;  Surgeon: Mitchel Honour, DO;  Location: [facility];  Service: Gynecology;  Laterality: N/A;   WISDOM TOOTH EXTRACTION  03/2016    Family History  Problem Relation Age of Onset   Diabetes Maternal Grandmother    Diabetes Maternal Grandfather    Cancer Paternal Grandmother    Diabetes Paternal Grandmother    Cancer Paternal Grandfather    Diabetes Paternal Grandfather     Allergies  Allergen Reactions   Amoxicillin Hives    Has patient had a PCN reaction causing immediate rash, facial/tongue/throat swelling, SOB or lightheadedness with hypotension: Yes Has patient had a PCN reaction causing severe rash involving mucus membranes or skin necrosis: Yes Has patient had a PCN reaction that required hospitalization: No Has patient had a PCN reaction occurring within the last 10 years: No If all of the above answers are "NO", then may proceed with Cephalosporin use.   Penicillins Hives    Has patient had a PCN reaction causing immediate rash, facial/tongue/throat swelling, SOB or lightheadedness with hypotension: Yes Has patient had a PCN reaction causing severe rash involving mucus membranes or skin necrosis: Yes Has patient had a PCN reaction that required hospitalization: No Has patient had a PCN reaction occurring within the last 10 years: No If all of the above answers are "NO", then may proceed with Cephalosporin use.    Sulfa Antibiotics Hives    Current Outpatient Medications on File Prior to Visit  Medication Sig Dispense Refill   ASHWAGANDHA GUMMIES PO Take by mouth daily.     cyclobenzaprine (FLEXERIL) 5 MG tablet Take 5-10 mg by mouth at bedtime.     FLUoxetine (PROZAC) 20 MG capsule Take 1 capsule (20 mg total) by mouth daily. Take with 40 mg capsule daily. 90 capsule 0   FLUoxetine (PROZAC) 40 MG capsule Take 1 capsule (40 mg total) by mouth daily. 90 capsule 0   lisdexamfetamine (VYVANSE) 20 MG capsule Take 1 capsule (20 mg  total) by mouth daily. 30 capsule 0   [START ON 01/20/2024] lisdexamfetamine (VYVANSE) 20 MG capsule Take 1 capsule (20 mg total) by mouth daily. 30 capsule 0   [START ON 02/19/2024] lisdexamfetamine (VYVANSE) 20 MG capsule Take 1 capsule (20 mg total) by mouth daily. 30 capsule 0   MAGNESIUM GLUCONATE PO Take by mouth at bedtime.     NIKKI 3-0.02 MG tablet Take 1 tablet by mouth daily.     Prenatal Vit-Fe Fumarate-FA (PRENATAL MULTIVITAMIN) TABS tablet Take 1 tablet by mouth daily at 12 noon. 60 tablet 1   ibuprofen (ADVIL) 600 MG tablet Take 1 tablet (600 mg total) by mouth every 6 (six) hours as needed. (Patient not taking: Reported on 01/07/2024) 60 tablet 0   No current facility-administered medications on file prior to visit.    BP 108/76   Pulse 94   Temp (!) 97.5 F (36.4 C) (Temporal)   Ht 5\' 4"  (1.626  m)   Wt 151 lb (68.5 kg)   LMP 12/29/2023   SpO2 99%   BMI 25.92 kg/m  Objective:   Physical Exam HENT:     Right Ear: Tympanic membrane and ear canal normal.     Left Ear: Tympanic membrane and ear canal normal.  Eyes:     Pupils: Pupils are equal, round, and reactive to light.  Cardiovascular:     Rate and Rhythm: Normal rate and regular rhythm.  Pulmonary:     Effort: Pulmonary effort is normal.     Breath sounds: Normal breath sounds.  Abdominal:     General: Bowel sounds are normal.     Palpations: Abdomen is soft.     Tenderness: There is no abdominal tenderness.  Musculoskeletal:        General: Normal range of motion.     Cervical back: Neck supple.  Skin:    General: Skin is warm and dry.  Neurological:     Mental Status: She is alert and oriented to person, place, and time.     Cranial Nerves: No cranial nerve deficit.     Deep Tendon Reflexes:     Reflex Scores:      Patellar reflexes are 2+ on the right side and 2+ on the left side. Psychiatric:        Mood and Affect: Mood normal.           Assessment & Plan:  Preventative health  care Assessment & Plan: Immunizations UTD. Declines influenza vaccine.  Pap smear UTD. Follows with GYN  Commended her on dietary changes and regular exercise. Encouraged to continue.   Exam stable. Labs pending.  Follow up in 1 year for repeat physical.   Orders: -     CBC -     Lipid panel -     TSH -     Comprehensive metabolic panel -     Vitamin B12 -     VITAMIN D 25 Hydroxy (Vit-D Deficiency, Fractures)  Chronic TMJ pain Assessment & Plan: Following with TMJ specialist.  Continue cyclobenzaprine 5 mg PRN.   Binge eating disorder, mild Assessment & Plan: Improved and controlled.  Continue Vyvanse 20 mg daily. Following with psychiatry.   Orders: -     Vitamin B12 -     VITAMIN D 25 Hydroxy (Vit-D Deficiency, Fractures)  Frequent headaches Assessment & Plan: Improved with TMJ treatment.  Continue cyclobenzaprine 5 mg PRN. She is working on getting a Financial trader.   Generalized anxiety disorder Assessment & Plan: Improved!  Continue fluoxetine 60 mg daily. Following with therapy and psychiatry.    Mild episode of recurrent major depressive disorder [facility]) Assessment & Plan: Improved!  Continue fluoxetine 60 mg daily. Following with therapy and psychiatry.   Orders: -     Vitamin B12 -     VITAMIN D 25 Hydroxy (Vit-D Deficiency, Fractures)  Mixed obsessional thoughts and acts Assessment & Plan: Improved!  Continue fluoxetine 60 mg daily. Following with therapy and psychiatry.          Doreene Nest, NP

## 2024-01-07 NOTE — Assessment & Plan Note (Signed)
Improved with TMJ treatment.  Continue cyclobenzaprine 5 mg PRN. She is working on getting a Financial trader.

## 2024-01-07 NOTE — Assessment & Plan Note (Signed)
Improved and controlled.  Continue Vyvanse 20 mg daily. Following with psychiatry.

## 2024-01-07 NOTE — Patient Instructions (Signed)
Stop by the lab prior to leaving today. I will notify you of your results once received.   It was a pleasure to see you today!  

## 2024-01-07 NOTE — Assessment & Plan Note (Addendum)
Immunizations UTD. Declines influenza vaccine.  Pap smear UTD. Follows with GYN  Commended her on dietary changes and regular exercise. Encouraged to continue.   Exam stable. Labs pending.  Follow up in 1 year for repeat physical.

## 2024-01-08 ENCOUNTER — Ambulatory Visit: Payer: No Typology Code available for payment source

## 2024-01-08 DIAGNOSIS — R5383 Other fatigue: Secondary | ICD-10-CM

## 2024-01-08 DIAGNOSIS — D72829 Elevated white blood cell count, unspecified: Secondary | ICD-10-CM

## 2024-01-08 LAB — IBC + FERRITIN
Ferritin: 97.6 ng/mL (ref 10.0–291.0)
Iron: 16 ug/dL — ABNORMAL LOW (ref 42–145)
Saturation Ratios: 4.4 % — ABNORMAL LOW (ref 20.0–50.0)
TIBC: 359.8 ug/dL (ref 250.0–450.0)
Transferrin: 257 mg/dL (ref 212.0–360.0)

## 2024-01-08 LAB — WHITE CELL DIFFERENTIAL
Band Neutrophils: 0 %
Basophils Relative: 0.5 % (ref 0.0–3.0)
Eosinophils Relative: 0.6 % (ref 0.0–5.0)
Lymphocytes Relative: 15.3 % (ref 12.0–46.0)
Monocytes Relative: 5.7 % (ref 3.0–12.0)
Neutrophils Relative %: 77.9 % — ABNORMAL HIGH (ref 43.0–77.0)

## 2024-01-09 ENCOUNTER — Ambulatory Visit
Admission: EM | Admit: 2024-01-09 | Discharge: 2024-01-09 | Disposition: A | Discharge status: Home / Self Care | Payer: No Typology Code available for payment source | Attending: Emergency Medicine | Admitting: Emergency Medicine

## 2024-01-09 DIAGNOSIS — H6692 Otitis media, unspecified, left ear: Secondary | ICD-10-CM | POA: Diagnosis not present

## 2024-01-09 DIAGNOSIS — D72829 Elevated white blood cell count, unspecified: Secondary | ICD-10-CM

## 2024-01-09 DIAGNOSIS — R300 Dysuria: Secondary | ICD-10-CM | POA: Diagnosis not present

## 2024-01-09 DIAGNOSIS — E611 Iron deficiency: Secondary | ICD-10-CM

## 2024-01-09 DIAGNOSIS — J069 Acute upper respiratory infection, unspecified: Secondary | ICD-10-CM | POA: Diagnosis not present

## 2024-01-09 LAB — POCT URINALYSIS DIP (MANUAL ENTRY)
Bilirubin, UA: NEGATIVE
Glucose, UA: NEGATIVE mg/dL
Ketones, POC UA: NEGATIVE mg/dL
Nitrite, UA: NEGATIVE
Protein Ur, POC: NEGATIVE mg/dL
Spec Grav, UA: 1.01 (ref 1.010–1.025)
Urobilinogen, UA: 0.2 U/dL
pH, UA: 6.5 (ref 5.0–8.0)

## 2024-01-09 LAB — POCT URINE PREGNANCY: Preg Test, Ur: NEGATIVE

## 2024-01-09 MED ORDER — CEFDINIR 300 MG PO CAPS: 300.0000 mg | ORAL_CAPSULE | Freq: Two times a day (BID) | ORAL | 0 refills | Status: AC

## 2024-01-09 NOTE — ED Provider Notes (Signed)
Stacey Houston    CSN: [id number] Arrival date & time: 01/09/24  [id number]      History   Chief Complaint Chief Complaint  Patient presents with   URI    HPI Stacey Houston is a 38 y.o. female.  Patient presents with 5-day history of ear pain, sore throat, congestion, cough.  No fever or shortness of breath.  Patient also presents with malodorous urine and dysuria x 3 days.  No abdominal pain or hematuria.  No OTC medications taken today.  Patient was seen by her PCP on 01/07/2024 for preventative healthcare exam, chronic TMJ pain, binge eating disorder, frequent headaches, generalized anxiety disorder, depressive disorder, mixed obsessional thoughts and acts.  The history is provided by the patient and medical records.    Past Medical History:  Diagnosis Date   Adjustment reaction with anxiety and depression 12/26/2022   Anemia    Anxiety    Postpartum   Chronic pelvic pain in female    Constipation 01/10/2015   Depression    Postpartum   H/O chronic cystitis    History of recurrent UTIs    History of urethral stricture    Nephrolithiasis    bilateral non-obstructive per urologist note 06-16-2017  -- dr Marlou Porch   Normal labor 11/10/2018   Pregnancy 12/11/2021   Retained placenta 11/11/2018   Rib pain 12/26/2022    Patient Active Problem List   Diagnosis Date Noted   Preventative health care 01/07/2024   On stimulant medication 10/21/2023   Binge eating disorder, mild 09/19/2023   History of strep pharyngitis 08/28/2023   Chronic TMJ pain 08/28/2023   OCD (obsessive compulsive disorder) 06/03/2023   Generalized anxiety disorder 06/03/2023   Mild episode of recurrent major depressive disorder (HCC) 06/03/2023   Frequent headaches 12/26/2022   Abnormal thyroid blood test 12/26/2022    Past Surgical History:  Procedure Laterality Date   AUGMENTATION MAMMAPLASTY Bilateral 2009   CHROMOPERTUBATION N/A 08/12/2017   Procedure: CHROMOPERTUBATION;  Surgeon:  Mitchel Honour, DO;  Location: [facility];  Service: Gynecology;  Laterality: N/A;   CYSTO/  URETHRAL DILATION  11/09/2004   DILATION AND EVACUATION N/A 11/11/2018   Procedure: DILATATION AND EVACUATION;  Surgeon: Zelphia Cairo, MD;  Location: [facility];  Service: Gynecology;  Laterality: N/A;   LAPAROSCOPY N/A 08/12/2017   Procedure: LAPAROSCOPY DIAGNOSTIC [place] OF ENDOMETRIOSI;  Surgeon: Mitchel Honour, DO;  Location: [facility];  Service: Gynecology;  Laterality: N/A;   WISDOM TOOTH EXTRACTION  03/2016    OB History     Gravida  2   Para  2   Term  2   Preterm      AB      Living  2      SAB      IAB      Ectopic      Multiple  0   Live Births  2            Home Medications    Prior to Admission medications   Medication Sig Start Date End Date Taking? Authorizing Provider  cefdinir (OMNICEF) 300 MG capsule Take 1 capsule (300 mg total) by mouth 2 (two) times daily for 10 days. 01/09/24 01/19/24 Yes Mickie Bail, NP  ASHWAGANDHA GUMMIES PO Take by mouth daily.    [provider]  cyclobenzaprine (FLEXERIL) 5 MG tablet Take 5-10 mg by mouth at bedtime. 09/29/23 01/27/24  [provider]  FLUoxetine (PROZAC) 20 MG  capsule Take 1 capsule (20 mg total) by mouth daily. Take with 40 mg capsule daily. 11/24/23 02/22/24  Elsie Lincoln, MD  FLUoxetine (PROZAC) 40 MG capsule Take 1 capsule (40 mg total) by mouth daily. 11/24/23   Elsie Lincoln, MD  ibuprofen (ADVIL) 600 MG tablet Take 1 tablet (600 mg total) by mouth every 6 (six) hours as needed. Patient not taking: Reported on 01/07/2024 12/12/21   Harold Hedge, MD  lisdexamfetamine (VYVANSE) 20 MG capsule Take 1 capsule (20 mg total) by mouth daily. 12/22/23   Elsie Lincoln, MD  lisdexamfetamine (VYVANSE) 20 MG capsule Take 1 capsule (20 mg total) by mouth daily. 01/20/24 02/19/24  Elsie Lincoln, MD  lisdexamfetamine (VYVANSE) 20 MG capsule Take 1  capsule (20 mg total) by mouth daily. 02/19/24 03/20/24  Elsie Lincoln, MD  MAGNESIUM GLUCONATE PO Take by mouth at bedtime.    [provider]  NIKKI 3-0.02 MG tablet Take 1 tablet by mouth daily. 11/18/22   [provider]  Prenatal Vit-Fe Fumarate-FA (PRENATAL MULTIVITAMIN) TABS tablet Take 1 tablet by mouth daily at 12 noon. 12/12/21   Harold Hedge, MD    Family History Family History  Problem Relation Age of Onset   Diabetes Maternal Grandmother    Diabetes Maternal Grandfather    Cancer Paternal Grandmother    Diabetes Paternal Grandmother    Cancer Paternal Grandfather    Diabetes Paternal Grandfather     Social History Social History   Tobacco Use   Smoking status: Never   Smokeless tobacco: Never  Vaping Use   Vaping status: Never Used  Substance Use Topics   Alcohol use: Not Currently   Drug use: Never     Allergies   Amoxicillin, Penicillins, and Sulfa antibiotics   Review of Systems Review of Systems  Constitutional:  Negative for chills and fever.  HENT:  Positive for congestion, ear pain and sore throat.   Respiratory:  Positive for cough. Negative for shortness of breath.   Gastrointestinal:  Negative for abdominal pain, diarrhea and vomiting.  Genitourinary:  Positive for dysuria. Negative for flank pain, hematuria, pelvic pain and vaginal discharge.     Physical Exam Triage Vital Signs ED Triage Vitals [01/09/24 0823]  Encounter Vitals Group     BP 113/63     Systolic BP Percentile      Diastolic BP Percentile      Pulse Rate 100     Resp 18     Temp 98.8 F (37.1 C)     Temp src      SpO2 98 %     Weight      Height      Head Circumference      Peak Flow      Pain Score      Pain Loc      Pain Education      Exclude from Growth Chart    No data found.  Updated Vital Signs BP 113/63   Pulse 100   Temp 98.8 F (37.1 C)   Resp 18   LMP 12/29/2023   SpO2 98%   Visual Acuity Right Eye Distance:   Left Eye  Distance:   Bilateral Distance:    Right Eye Near:   Left Eye Near:    Bilateral Near:     Physical Exam Constitutional:      General: She is not in acute distress. HENT:     Right Ear: Tympanic membrane normal.  Left Ear: Tympanic membrane is erythematous.     Nose: Nose normal.     Mouth/Throat:     Mouth: Mucous membranes are moist.     Pharynx: Posterior oropharyngeal erythema present.  Cardiovascular:     Rate and Rhythm: Normal rate and regular rhythm.     Heart sounds: Normal heart sounds.  Pulmonary:     Effort: Pulmonary effort is normal. No respiratory distress.     Breath sounds: Normal breath sounds.  Abdominal:     General: Bowel sounds are normal.     Palpations: Abdomen is soft.     Tenderness: There is no abdominal tenderness. There is no right CVA tenderness, left CVA tenderness, guarding or rebound.  Neurological:     Mental Status: She is alert.      UC Treatments / Results  Labs (all labs ordered are listed, but only abnormal results are displayed) Labs Reviewed  POCT URINALYSIS DIP (MANUAL ENTRY) - Abnormal; Notable for the following components:      Result Value   Clarity, UA turbid (*)    Blood, UA trace-intact (*)    Leukocytes, UA Small (1+) (*)    All other components within normal limits  POCT URINE PREGNANCY    EKG   Radiology No results found.  Procedures Procedures (including critical care time)  Medications Ordered in UC Medications - No data to display  Initial Impression / Assessment and Plan / UC Course  I have reviewed the triage vital signs and the nursing notes.  Pertinent labs & imaging results that were available during my care of the patient were reviewed by me and considered in my medical decision making (see chart for details).    Left otitis media, acute upper respiratory infection, dysuria.  Afebrile and vital signs are stable.  Lungs are clear and O2 sat is 98% on room air.  Treating today with cefdinir  which patient has taken in the past without difficulty.  Education provided on otitis media, upper respiratory infection, and dysuria.  Instructed patient to follow up with her PCP if her symptoms are not improving.  She agrees to plan of care.   Final Clinical Impressions(s) / UC Diagnoses   Final diagnoses:  Left otitis media, unspecified otitis media type  Acute upper respiratory infection  Dysuria     Discharge Instructions      Take the cefdinir as directed.  Follow-up with your primary care provider if your symptoms are not improving.      ED Prescriptions     Medication Sig Dispense Auth. Provider   cefdinir (OMNICEF) 300 MG capsule Take 1 capsule (300 mg total) by mouth 2 (two) times daily for 10 days. 20 capsule Mickie Bail, NP      PDMP not reviewed this encounter.   Mickie Bail, NP 01/09/24 [phone]

## 2024-01-09 NOTE — ED Triage Notes (Signed)
Patient to Urgent Care with complaints of nasal congestion and facial pain/ cough/ left sided ear pain/ sore throat. Symptoms started five days ago.  Also w/ concerns about a UTI- reports pain at the end of urination/ malodorous urine. Symptoms x3 days.

## 2024-01-09 NOTE — Discharge Instructions (Addendum)
Take the cefdinir as directed.  Follow-up with your primary care provider if your symptoms are not improving.

## 2024-01-15 ENCOUNTER — Ambulatory Visit: Payer: No Typology Code available for payment source | Admitting: Primary Care

## 2024-01-15 ENCOUNTER — Ambulatory Visit: Payer: No Typology Code available for payment source | Admitting: Clinical

## 2024-01-15 ENCOUNTER — Encounter: Payer: Self-pay | Admitting: Primary Care

## 2024-01-15 VITALS — BP 110/72 | HR 105 | Temp 97.5°F | Ht 64.0 in | Wt 152.0 lb

## 2024-01-15 DIAGNOSIS — F330 Major depressive disorder, recurrent, mild: Secondary | ICD-10-CM

## 2024-01-15 DIAGNOSIS — F411 Generalized anxiety disorder: Secondary | ICD-10-CM

## 2024-01-15 DIAGNOSIS — F422 Mixed obsessional thoughts and acts: Secondary | ICD-10-CM | POA: Diagnosis not present

## 2024-01-15 DIAGNOSIS — H65192 Other acute nonsuppurative otitis media, left ear: Secondary | ICD-10-CM | POA: Diagnosis not present

## 2024-01-15 NOTE — Progress Notes (Signed)
[facility] Counselor/Therapist Progress Note  Patient ID: Stacey Houston, MRN: [id number],    Date: 01/15/2024  Time Spent: 8:43am - 9:35am : 52 minutes   Treatment Type: Individual Therapy  Reported Symptoms: Patient reported recent decline in mood  Mental Status Exam: Appearance:  Neat and Well Groomed     Behavior: Appropriate  Motor: Normal  Speech/Language:  Clear and Coherent and Normal Rate  Affect: Appropriate  Mood: Patient reported feeling "blah"  Thought process: normal  Thought content:   WNL  Sensory/Perceptual disturbances:   Patient reported experiencing pain in patient's ear   Orientation: oriented to person, place, and situation  Attention: Good  Concentration: Good  Memory: WNL  Fund of knowledge:  Good  Insight:   Good  Judgment:  Good  Impulse Control: Good   Risk Assessment: Danger to Self:  No Patient denied current suicidal ideation  Self-injurious Behavior: No Danger to Others: No Patient denied current homicidal ideation Duty to Warn:no Physical Aggression / Violence:No  Access to Firearms a concern: No  Gang Involvement:No   Subjective: Patient stated, "kind of up and down" in response to events since last session. Patient reported patient's son started daycare and has been experiencing difficulty sleeping for the past 4 weeks. Patient reported improvement in son's sleep over the past two nights. Patient reported she is recovering from an ear infection, bladder infection, and a sinus infection. Patient reported she is still experiencing pain in her ear. Patient reported she has a follow up appointment with her PCP today to discuss pain in patient's ear. Patient reported she received results from a CT scan that indicated other issues that can contribute to headaches and pain in the face/neck. Patient reported she has a follow up appointment with the TMJ specialist on February 6th. Patient stated, "pretty good" in response to mood  since last session. Patient stated, "I think vyvanse might give me a little energy in the mornings". Patient reported "probably just blah" in response to today's mood. Patient stated, "my weight loss journey is not like I wanted it to be", "that's also kind of kept me down". Patient stated, "I feel like I'm just kind of stuck" in reference to weight loss. Patient reported ear pain, lack of sleep, weight loss, "kids being sick", balancing children's personalities, and increase in grandfather's hospice care are triggers for recent decline in mood. Patient reported she did not take the vyvanse for 2 days recently due to an issue at the pharmacy and stated,  "I didn't care as much" in reference to obsessive thoughts.  Patient stated, "its not making me clean the house every day but I think that's also because I'm aware of it". Patient stated, "I can see where it (medication) makes OCD bad". Patient stated, "I've been a lot happier recently since my OCD is better", "across the board that has helped tremendously". Patient reported "feeling healthier" in response to recent dietary changes. Patient reported she continues to maintain a thought record/worry journal daily.  Interventions: Cognitive Behavioral Therapy and supportive therapy . Clinician conducted session in person at clinician's office at [facility]. Reviewed events since last session. Provided supportive therapy, active listening, and validation as patient discussed recent [place] concerns and CT results. Assessed patient's mood since last session and current mood. Discussed additional changes in symptoms since resuming vyvanse. Assisted patient in exploring and identifying triggers for recent decline in mood. Assessed intensity and frequency of obsessive thoughts/compulsions. Discussed changes in patient's mood in response  to improvement in symptoms of OCD. Reviewed coping strategies patient has been utilizing in response to obsessive thoughts.  Clinician requested patient continue thought record, practice utilizing socratic questions to challenge negative thoughts/thought distortions, maintain worry journal for homework, and practice identifying one positive for every negative thought patient experiences.     Collaboration of Care: Other not required at this time   Diagnosis:  Mixed obsessional thoughts and acts   Generalized anxiety disorder   Mild episode of recurrent major depressive disorder (HCC)     Plan: Patient is to utilize [employer] [facility], thought re-framing, relaxation techniques, mindfulness and coping strategies to decrease symptoms associated with their diagnosis. Frequency: bi-weekly  Modality: individual      Long-term goal:   Reduce overall level, frequency, and intensity of the feelings of depression and anxiety as evidenced by decrease obsessive thoughts, compulsions, worry, depressed mood from 6 to 7 days/week to 0 to 1 days/week per patient report for at least 3 consecutive months. Target Date: 07/30/24  Progress: progressing    Short-term goal:  Verbalize an understanding of the role that distorted thinking plays in creating fears, excessive worry, and ruminations. Target Date: 01/31/24  Progress: progressing    Reduce time spent completing compulsions from 1 hour per day during the week and 4 hours per day on the weekends Target Date: 01/31/24  Progress: progressing    Reduce impulse to immediately complete compulsions, such as, cleaning kitchen after meals, taking clothes out of dryer once dryer is complete, and picking up children's toys Target Date: 01/31/24  Progress: progressing    Develop and implement mindfulness exercises to increase patient's focus on being in the present  Target Date: 01/31/24  Progress: progressing    Identify, challenge, and replace negative thought patterns that contribute to feelings of depression, anxiety, and obsessive thoughts with positive thoughts and  beliefs per patient's report Target Date: 01/31/24  Progress: progressing     Doree Barthel, LCSW

## 2024-01-15 NOTE — Assessment & Plan Note (Signed)
Appears to be healing well.  Continue cefdinir 3 mg twice daily until complete. Increase Flonase to twice daily dosing. Offered course of steroids to help with symptom management, she kindly declines.  Reassurance provided. Follow-up if no continued improvement.

## 2024-01-15 NOTE — Progress Notes (Signed)
Subjective:    Patient ID: Stacey Houston, female    DOB: Aug 06, 1986, 38 y.o.   MRN: [id number]  HPI  Stacey Houston is a very pleasant 38 y.o. female with a history of chronic TMJ pain, OCD, GAD, MDD, binge eating disorder, acute otitis media who presents today to discuss otalgia.  Evaluated urgent care in [place] on 01/09/2024 for a 5-day history of URI symptoms.  Diagnosed with acute otitis media to the left and treated with cefdinir 300 mg twice daily x 10 days.  Today she discussed continued left ear pain for which she describes as throbbing and sharp. She's also noticing pain to there left side of her face. Also with a reduction in ability to hear which began 6 days ago, "feels like I'm in a barrel". She's been taking Ibuprofen 400 mg once to twice daily which isn't helping much.   She has been using Flonase once daily at night which as helped. She is compliant to her cefdinir twice daily.  Wt Readings from Last 3 Encounters:  01/15/24 152 lb (68.9 kg)  01/07/24 151 lb (68.5 kg)  11/27/23 153 lb (69.4 kg)     Review of Systems  Constitutional:  Negative for fever.  HENT:  Positive for congestion and ear pain.   Respiratory:  Positive for cough.   Neurological:  Positive for headaches.         Past Medical History:  Diagnosis Date   Adjustment reaction with anxiety and depression 12/26/2022   Anemia    Anxiety    Postpartum   Chronic pelvic pain in female    Constipation 01/10/2015   Depression    Postpartum   H/O chronic cystitis    History of recurrent UTIs    History of urethral stricture    Nephrolithiasis    bilateral non-obstructive per urologist note 06-16-2017  -- dr Marlou Porch   Normal labor 11/10/2018   Pregnancy 12/11/2021   Retained placenta 11/11/2018   Rib pain 12/26/2022    Social History   Socioeconomic History   Marital status: Married    Spouse name: Not on file   Number of children: Not on file   Years of education: Not on  file   Highest education level: Not on file  Occupational History   Not on file  Tobacco Use   Smoking status: Never   Smokeless tobacco: Never  Vaping Use   Vaping status: Never Used  Substance and Sexual Activity   Alcohol use: Not Currently   Drug use: Never   Sexual activity: Yes    Birth control/protection: OCP  Other Topics Concern   Not on file  Social History Narrative   Not on file   Social Drivers of Health   Financial Resource Strain: Not on file  Food Insecurity: Not on file  Transportation Needs: Not on file  Physical Activity: Not on file  Stress: Not on file  Social Connections: Not on file  Intimate Partner Violence: Not on file    Past Surgical History:  Procedure Laterality Date   AUGMENTATION MAMMAPLASTY Bilateral 2009   CHROMOPERTUBATION N/A 08/12/2017   Procedure: CHROMOPERTUBATION;  Surgeon: Mitchel Honour, DO;  Location: [facility];  Service: Gynecology;  Laterality: N/A;   CYSTO/  URETHRAL DILATION  11/09/2004   DILATION AND EVACUATION N/A 11/11/2018   Procedure: DILATATION AND EVACUATION;  Surgeon: Zelphia Cairo, MD;  Location: [facility];  Service: Gynecology;  Laterality: N/A;   LAPAROSCOPY N/A 08/12/2017  Procedure: LAPAROSCOPY DIAGNOSTIC [place] OF ENDOMETRIOSI;  Surgeon: Mitchel Honour, DO;  Location: [facility];  Service: Gynecology;  Laterality: N/A;   WISDOM TOOTH EXTRACTION  03/2016    Family History  Problem Relation Age of Onset   Diabetes Maternal Grandmother    Diabetes Maternal Grandfather    Cancer Paternal Grandmother    Diabetes Paternal Grandmother    Cancer Paternal Grandfather    Diabetes Paternal Grandfather     Allergies  Allergen Reactions   Amoxicillin Hives    Has patient had a PCN reaction causing immediate rash, facial/tongue/throat swelling, SOB or lightheadedness with hypotension: Yes Has patient had a PCN reaction causing severe rash involving mucus membranes or  skin necrosis: Yes Has patient had a PCN reaction that required hospitalization: No Has patient had a PCN reaction occurring within the last 10 years: No If all of the above answers are "NO", then may proceed with Cephalosporin use.   Penicillins Hives    Has patient had a PCN reaction causing immediate rash, facial/tongue/throat swelling, SOB or lightheadedness with hypotension: Yes Has patient had a PCN reaction causing severe rash involving mucus membranes or skin necrosis: Yes Has patient had a PCN reaction that required hospitalization: No Has patient had a PCN reaction occurring within the last 10 years: No If all of the above answers are "NO", then may proceed with Cephalosporin use.    Sulfa Antibiotics Hives    Current Outpatient Medications on File Prior to Visit  Medication Sig Dispense Refill   ASHWAGANDHA GUMMIES PO Take by mouth daily.     cefdinir (OMNICEF) 300 MG capsule Take 1 capsule (300 mg total) by mouth 2 (two) times daily for 10 days. 20 capsule 0   cyclobenzaprine (FLEXERIL) 5 MG tablet Take 5-10 mg by mouth at bedtime.     FLUoxetine (PROZAC) 20 MG capsule Take 1 capsule (20 mg total) by mouth daily. Take with 40 mg capsule daily. 90 capsule 0   FLUoxetine (PROZAC) 40 MG capsule Take 1 capsule (40 mg total) by mouth daily. 90 capsule 0   ibuprofen (ADVIL) 600 MG tablet Take 1 tablet (600 mg total) by mouth every 6 (six) hours as needed. 60 tablet 0   lisdexamfetamine (VYVANSE) 20 MG capsule Take 1 capsule (20 mg total) by mouth daily. 30 capsule 0   [START ON 01/20/2024] lisdexamfetamine (VYVANSE) 20 MG capsule Take 1 capsule (20 mg total) by mouth daily. 30 capsule 0   [START ON 02/19/2024] lisdexamfetamine (VYVANSE) 20 MG capsule Take 1 capsule (20 mg total) by mouth daily. 30 capsule 0   MAGNESIUM GLUCONATE PO Take by mouth at bedtime.     NIKKI 3-0.02 MG tablet Take 1 tablet by mouth daily.     Prenatal Vit-Fe Fumarate-FA (PRENATAL MULTIVITAMIN) TABS tablet Take 1  tablet by mouth daily at 12 noon. 60 tablet 1   No current facility-administered medications on file prior to visit.    BP 110/72   Pulse (!) 105   Temp (!) 97.5 F (36.4 C) (Temporal)   Ht 5\' 4"  (1.626 m)   Wt 152 lb (68.9 kg)   LMP 12/29/2023   SpO2 98%   BMI 26.09 kg/m  Objective:   Physical Exam Constitutional:      General: She is not in acute distress. HENT:     Right Ear: Tympanic membrane and ear canal normal. Tympanic membrane is not erythematous, retracted or bulging.     Left Ear: Ear canal normal. A middle  ear effusion is present. Tympanic membrane is retracted. Tympanic membrane is not erythematous.  Cardiovascular:     Rate and Rhythm: Normal rate.  Pulmonary:     Effort: Pulmonary effort is normal.  Skin:    General: Skin is warm and dry.  Neurological:     Mental Status: She is alert.           Assessment & Plan:  Acute otitis media with effusion of left ear Assessment & Plan: Appears to be healing well.  Continue cefdinir 3 mg twice daily until complete. Increase Flonase to twice daily dosing. Offered course of steroids to help with symptom management, she kindly declines.  Reassurance provided. Follow-up if no continued improvement.         Doreene Nest, NP

## 2024-01-15 NOTE — Progress Notes (Signed)
Stacey Barthel, LCSW

## 2024-01-15 NOTE — Patient Instructions (Signed)
Continue the antibiotic as prescribed.  Increase the Flonase to twice daily.  It was a pleasure to see you today!

## 2024-01-27 ENCOUNTER — Ambulatory Visit: Payer: No Typology Code available for payment source | Admitting: Clinical

## 2024-02-05 ENCOUNTER — Other Ambulatory Visit: Payer: No Typology Code available for payment source

## 2024-02-05 ENCOUNTER — Ambulatory Visit: Payer: No Typology Code available for payment source | Admitting: Clinical

## 2024-02-05 DIAGNOSIS — F411 Generalized anxiety disorder: Secondary | ICD-10-CM | POA: Diagnosis not present

## 2024-02-05 DIAGNOSIS — F422 Mixed obsessional thoughts and acts: Secondary | ICD-10-CM

## 2024-02-05 DIAGNOSIS — F330 Major depressive disorder, recurrent, mild: Secondary | ICD-10-CM | POA: Diagnosis not present

## 2024-02-05 NOTE — Progress Notes (Signed)
 [facility] Counselor/Therapist Progress Note  Patient ID: Stacey Houston, MRN: [id number],    Date: 02/05/2024  Time Spent: 8:32am - 9:32am : 60 minutes   Treatment Type: Individual Therapy  Reported Symptoms: Patient reported feeling "more down recently"  Mental Status Exam: Appearance:  Neat and Well Groomed     Behavior: Appropriate  Motor: Normal  Speech/Language:  Clear and Coherent and Normal Rate  Affect: Appropriate  Mood: Patient stated, "I think I'm feeling ok"  Thought process: normal  Thought content:   WNL  Sensory/Perceptual disturbances:   WNL  Orientation: oriented to person, place, situation, and day of week  Attention: Good  Concentration: Good  Memory: WNL  Fund of knowledge:  Good  Insight:   Good  Judgment:  Good  Impulse Control: Good   Risk Assessment: Danger to Self:  No Patient denied current suicidal ideation  Self-injurious Behavior: No Danger to Others: No Patient denied current homicidal ideation Duty to Warn:no Physical Aggression / Violence:No  Access to Firearms a concern: No  Gang Involvement:No   Subjective: Patient reported her grandfather passed away recently and patient stated, "It was hard". Patient reported her grandfather's health had been declining prior to his passing. Patient stated, "I'm good I guess", "I guess I've been more down recently". Patient reported patient's mother attended the visitation service and patient reported she has not seen her mother in over 3 years. Patient reported her mother met patient's husband for the first time at her grandfather's visitation. Patient stated, "good" in reference to patient's interaction with mother at the visitation. Patient stated, "I feel better now that she and I can talk and she's met my husband". Patient reported her mother has a history of alcohol use and stated, "she was a functional alcoholic for most of my life". Patient reported her mother has not used alcohol  in over a year. Patient reported she was able to spend time with her grandfather several days prior to his passing. Patient stated, "It's kind of like a hole because he's been there my whole life". Patient stated, "I think I'm feeling ok" in response to current mood. Patient stated, "I would say that we have achieved that goal, I am much better about those thoughts" in response to patient's first short term goal. Patient stated, "I feel like I'm good", "use to I would clean every week except for the week she (housekeeper) didn't clean" and patient reported she is cleaning twice a month in response to patient's second short term goal. Patient stated, "knowing nothing bad is going to happen if the house is not cloroxed every 5 days has helped". Patient reported cleaning "maybe 15 minutes during the week and sometimes not even that". Patient stated, "I'm much better about that one", "Ive also gotten much better about the children's toys" and reported she does not pick up her children's toys until their nap time. Patient stated, "most of the time I think its met" in response to patient's third short term goal. Patient stated, "I think I do well with that now", "I think that one's good" in response to patient's fourth short term goal. Patient stated, "I think that I've done good work on that, I don't think that one's all the way achieved" in response to patient's fifth short term goal.   Interventions: Motivational Interviewing and supportive therapy.  Clinician conducted session via caregility video from clinician's home office. Patient provided verbal consent to proceed with telehealth session and is aware of limitations  of telephone or video visits. Patient participated in session from patient's home. Reviewed events since last session. Provided supportive therapy and active listening as patient discussed the recent loss of her grandfather, patient's mother's presence at her grandfather's services, and patient's  response. Provided psycho education related to grief/loss. Assessed patient's mood. Reviewed patient's goals and patient's progress towards goals. Clinician requested patient continue thought record, practice utilizing socratic questions to challenge negative thoughts/thought distortions, maintain worry journal for homework, and practice identifying one positive for every negative thought patient experiences.     Collaboration of Care: Other not required at this time   Diagnosis:  Mixed obsessional thoughts and acts   Generalized anxiety disorder   Mild episode of recurrent major depressive disorder (HCC)     Plan: Patient is to utilize [employer] [facility], thought re-framing, relaxation techniques, mindfulness and coping strategies to decrease symptoms associated with their diagnosis. Frequency: bi-weekly  Modality: individual      Long-term goal:   Reduce overall level, frequency, and intensity of the feelings of depression and anxiety as evidenced by decrease obsessive thoughts, compulsions, worry, depressed mood from 6 to 7 days/week to 0 to 1 days/week per patient report for at least 3 consecutive months. Target Date: 07/30/24  Progress: progressing    Short-term goal:  Verbalize an understanding of the role that distorted thinking plays in creating fears, excessive worry, and ruminations.  Target Date: 01/31/24  Progress: Patient reported she feels this goal has been met.     Reduce time spent completing compulsions from 1 hour per day during the week and 4 hours per day on the weekends.  Target Date: 01/31/24  Progress: Patient reported she feels this goal has been met.    Reduce impulse to immediately complete compulsions, such as, cleaning kitchen after meals, taking clothes out of dryer once dryer is complete, and picking up children's toys  Target Date: 07/30/24  Progress: progressing    Develop and implement mindfulness exercises to increase patient's focus on  being in the present  Target Date: 01/31/24  Progress: Patient reported she feels this goal has been met.    Identify, challenge, and replace negative thought patterns that contribute to feelings of depression, anxiety, and obsessive thoughts with positive thoughts and beliefs per patient's report  Target Date: 07/30/24  Progress: progressing       Doree Barthel, LCSW

## 2024-02-05 NOTE — Progress Notes (Signed)
   Doree Barthel, LCSW

## 2024-02-09 ENCOUNTER — Other Ambulatory Visit (INDEPENDENT_AMBULATORY_CARE_PROVIDER_SITE_OTHER): Payer: No Typology Code available for payment source

## 2024-02-09 DIAGNOSIS — E611 Iron deficiency: Secondary | ICD-10-CM

## 2024-02-09 DIAGNOSIS — D72829 Elevated white blood cell count, unspecified: Secondary | ICD-10-CM | POA: Diagnosis not present

## 2024-02-09 LAB — CBC WITH DIFFERENTIAL/PLATELET
Basophils Absolute: 0.1 10*3/uL (ref 0.0–0.1)
Basophils Relative: 1.1 % (ref 0.0–3.0)
Eosinophils Absolute: 0.3 10*3/uL (ref 0.0–0.7)
Eosinophils Relative: 3.8 % (ref 0.0–5.0)
HCT: 39.9 % (ref 36.0–46.0)
Hemoglobin: 13.5 g/dL (ref 12.0–15.0)
Lymphocytes Relative: 28.1 % (ref 12.0–46.0)
Lymphs Abs: 2 10*3/uL (ref 0.7–4.0)
MCHC: 33.8 g/dL (ref 30.0–36.0)
MCV: 89.9 fl (ref 78.0–100.0)
Monocytes Absolute: 0.4 10*3/uL (ref 0.1–1.0)
Monocytes Relative: 5.6 % (ref 3.0–12.0)
Neutro Abs: 4.3 10*3/uL (ref 1.4–7.7)
Neutrophils Relative %: 61.4 % (ref 43.0–77.0)
Platelets: 367 10*3/uL (ref 150.0–400.0)
RBC: 4.44 Mil/uL (ref 3.87–5.11)
RDW: 12.7 % (ref 11.5–15.5)
WBC: 7.1 10*3/uL (ref 4.0–10.5)

## 2024-02-09 LAB — RETICULOCYTES
ABS Retic: 67950 {cells}/uL (ref 20000–80000)
Retic Ct Pct: 1.5 %

## 2024-02-09 LAB — C-REACTIVE PROTEIN: CRP: <1 mg/dL (ref 0.5–20.0)

## 2024-02-09 LAB — IBC + FERRITIN
Ferritin: 37.7 ng/mL (ref 10.0–291.0)
Iron: 80 ug/dL (ref 42–145)
Saturation Ratios: 20.5 % (ref 20.0–50.0)
TIBC: 390.6 ug/dL (ref 250.0–450.0)
Transferrin: 279 mg/dL (ref 212.0–360.0)

## 2024-02-27 ENCOUNTER — Ambulatory Visit: Payer: No Typology Code available for payment source | Admitting: Clinical

## 2024-02-27 DIAGNOSIS — F330 Major depressive disorder, recurrent, mild: Secondary | ICD-10-CM | POA: Diagnosis not present

## 2024-02-27 DIAGNOSIS — F411 Generalized anxiety disorder: Secondary | ICD-10-CM | POA: Diagnosis not present

## 2024-02-27 DIAGNOSIS — F422 Mixed obsessional thoughts and acts: Secondary | ICD-10-CM

## 2024-02-27 NOTE — Progress Notes (Signed)
   Stacey Barthel, LCSW

## 2024-02-27 NOTE — Progress Notes (Signed)
 [facility] Counselor/Therapist Progress Note  Patient ID: Stacey Houston, MRN: [id number],    Date: 02/27/2024  Time Spent: 8:32am - 9:26am : 54 minutes  Treatment Type: Individual Therapy  Reported Symptoms: Patient reported feelings of anxiety and feeling "down and out"  Mental Status Exam: Appearance:  Neat and Well Groomed     Behavior: Appropriate  Motor: Normal  Speech/Language:  Clear and Coherent and Normal Rate  Affect: Appropriate  Mood: Patient reported feeling "down and out"  Thought process: normal  Thought content:   WNL  Sensory/Perceptual disturbances:   WNL  Orientation: oriented to person, place, and situation  Attention: Good  Concentration: Good  Memory: WNL  Fund of knowledge:  Good  Insight:   Good  Judgment:  Good  Impulse Control: Good   Risk Assessment: Danger to Self:  No Patient denied current suicidal ideation  Self-injurious Behavior: No Danger to Others: No Patient denied current homicidal ideation Duty to Warn:no Physical Aggression / Violence:No  Access to Firearms a concern: No  Gang Involvement:No   Subjective: Patient stated, "its been difficult I would say", "not so much that he (grandfather) died but life without him is way different for my grandma and everybody" in response to events since last session. Patient stated, "I have been kind of down and out, I don't know whether its been the weather or its the things I want done outside that are not done".  Patient reported the list of home repairs is a trigger. Patient stated, "I don't look forward to that (conversation with husband), I still have a lot of anxiety talking to him about that (list of household repairs)". Patient reported she feels after patient's children go to bed on a Friday or Saturday night would be an ideal time to have a conversation with husband. Patient reported potentially feeling dismissed is a barrier to patient initiating a conversation with  husband.  Patient stated, "I don't really have evidence to support that, he was not dismissive", "I think it's just fear in my brain". Patient stated, "I'm definitely going to talk to him before the weekend's over". Patient stated, "I don't have any reason to be anxious to bring it up". Patient stated, "the fear of the things not getting done bothers me", "I really don't want to get back to not saying my feelings". Patient reported husband's work schedule, lack of time, stated "its not in his brain everyday like it is in mine", husband working on his father's farm are potential reasons husband has not completed the list of repairs. Patient stated, "I was positive, happy and more laid back after we had that conversation" in reference to discussion of household repairs. Patient stated, "I think it has been hindering my OCD as well, when I get down my OCD is not great".  Patient reported not having control over the list being completed triggers patient to try to control what patient feels she can control in the house. Patient stated,  "everyday it circles back to the list" in response to patient's thought record/worry journal. Patient stated, "Stacey Houston been trying to do that, I've been a lot better about doing that", "its harder for me to do it when I feel down" in response to patient's homework to identify a positive for every negative.   Interventions: Cognitive Behavioral Therapy and Interpersonal. Clinician conducted session via caregility video from clinician's home office. Patient provided verbal consent to proceed with telehealth session and is aware of limitations of telephone or  video visits. Patient participated in session from patient's home. Reviewed events since last session. Explored and identified triggers for anxiety and decline in mood. Provided psycho education related to triggers for depression and anxiety. Explored and identified barriers to patient initiating a conversation with her husband.  Challenged patient's statement regarding fear of being dismissed by husband and assisted patient in practicing identifying evidence for/against fear of being dismissed. Assisted patient in exploring and identifying alternatives to the situation regarding the list of home repairs. Reviewed use of "I" statements in communication and refraining from keeping score. Reviewed patient's thought record/worry journal. Reviewed patient's homework to identify a positive for every negative.  Clinician requested for homework patient continue thought record, practice utilizing socratic questions to challenge negative thoughts/thought distortions, maintain worry journal, and practice identifying one positive for every negative thought patient experiences.    Collaboration of Care: Other not required at this time   Diagnosis:  Mixed obsessional thoughts and acts   Generalized anxiety disorder   Mild episode of recurrent major depressive disorder (HCC)     Plan: Patient is to utilize [employer] [facility], thought re-framing, relaxation techniques, mindfulness and coping strategies to decrease symptoms associated with their diagnosis. Frequency: bi-weekly  Modality: individual      Long-term goal:   Reduce overall level, frequency, and intensity of the feelings of depression and anxiety as evidenced by decrease obsessive thoughts, compulsions, worry, depressed mood from 6 to 7 days/week to 0 to 1 days/week per patient report for at least 3 consecutive months. Target Date: 07/30/24  Progress: progressing    Short-term goal:  Verbalize an understanding of the role that distorted thinking plays in creating fears, excessive worry, and ruminations.  Target Date: 01/31/24  Progress: Patient reported she feels this goal has been met.     Reduce time spent completing compulsions from 1 hour per day during the week and 4 hours per day on the weekends.  Target Date: 01/31/24  Progress: Patient reported she  feels this goal has been met.    Reduce impulse to immediately complete compulsions, such as, cleaning kitchen after meals, taking clothes out of dryer once dryer is complete, and picking up children's toys  Target Date: 07/30/24  Progress: progressing    Develop and implement mindfulness exercises to increase patient's focus on being in the present  Target Date: 01/31/24  Progress: Patient reported she feels this goal has been met.    Identify, challenge, and replace negative thought patterns that contribute to feelings of depression, anxiety, and obsessive thoughts with positive thoughts and beliefs per patient's report  Target Date: 07/30/24  Progress: progressing    Doree Barthel, LCSW

## 2024-03-10 ENCOUNTER — Other Ambulatory Visit (HOSPITAL_COMMUNITY): Payer: Self-pay | Admitting: Psychiatry

## 2024-03-10 DIAGNOSIS — F330 Major depressive disorder, recurrent, mild: Secondary | ICD-10-CM

## 2024-03-10 DIAGNOSIS — F422 Mixed obsessional thoughts and acts: Secondary | ICD-10-CM

## 2024-03-10 DIAGNOSIS — F411 Generalized anxiety disorder: Secondary | ICD-10-CM

## 2024-03-11 ENCOUNTER — Ambulatory Visit: Payer: No Typology Code available for payment source | Admitting: Clinical

## 2024-03-11 DIAGNOSIS — F330 Major depressive disorder, recurrent, mild: Secondary | ICD-10-CM | POA: Diagnosis not present

## 2024-03-11 DIAGNOSIS — F422 Mixed obsessional thoughts and acts: Secondary | ICD-10-CM

## 2024-03-11 DIAGNOSIS — F411 Generalized anxiety disorder: Secondary | ICD-10-CM | POA: Diagnosis not present

## 2024-03-11 NOTE — Progress Notes (Signed)
 [facility] Counselor/Therapist Progress Note  Patient ID: DARRIS CARACHURE, MRN: [id number],    Date: 03/11/2024  Time Spent: 8:42am -9:32am : 50 minutes   Treatment Type: Individual Therapy  Reported Symptoms: Patient reported "low" mood  Mental Status Exam: Appearance:  Neat and Well Groomed     Behavior: Appropriate  Motor: Normal  Speech/Language:  Clear and Coherent and Normal Rate  Affect: Appropriate  Mood: Patient reported "low" mood  Thought process: normal  Thought content:   WNL  Sensory/Perceptual disturbances:   WNL  Orientation: oriented to person, place, and situation  Attention: Good  Concentration: Good  Memory: WNL  Fund of knowledge:  Good  Insight:   Good  Judgment:  Good  Impulse Control: Good   Risk Assessment: Danger to Self:  No Patient denied current suicidal ideation  Self-injurious Behavior: No Danger to Others: No Patient denied current homicidal ideation Duty to Warn:no Physical Aggression / Violence:No  Access to Firearms a concern: No  Gang Involvement:No   Subjective: Patient stated, "I would say about the same" in response to mood since last session. Patient reported patient was recently notified a friend's children contracted lice. Patient reported patient checked her children for lice for several days after being notified. Patient reported initial worry in response to lice.  Patient stated "I didn't freak out" in reference to the situation and stated, "I did much better". Patient stated, "I was proud of myself for that, I didn't have a big meltdown worry". Patient stated, "I didn't catastrophize every little thing that could go wrong" and "I was really happy with how I handled it". Patient reported she noted a decline in mood during this time last year and reported she noted a slight decline in mood at the same time this year. Patient reported she started writing what she was worried about and why she felt she was worried in  patient's worry journal. Patient reported she feels can not enjoy this time of year due to the list of incomplete household repairs. Patient reported she initiated a conversation with her husband to discuss patient's thoughts/feelings related to list of incomplete household repairs. Patient reported she felt her husband was understanding of patient's thoughts/feelings related to the list of household repairs. Patient stated, "our (patient/husband) talking has become much better", "I'm much more mindful now of how I communicate with him". Patient reported concern regarding side effects of Prozac and reported she plans to ask the psychiatrist about the medications. Patient stated, "I feel like it kind of dulls emotions" in reference to Prozac. Patient reported "low" mood today.   Interventions: Cognitive Behavioral Therapy and Interpersonal. Clinician conducted session in person at clinician's office at [facility]. Reviewed events since last session and assessed for changes. Processed patient's thoughts, feelings and behavioral response to being notified friend's children contacted lice. Discussed decline in mood and examined triggers associated with decline in mood. Discussed patient's recent conversation with patient's husband regarding household repairs and the outcome. Reviewed changes in patient's communication and the impact on patient's communication with husband. Provided supportive therapy and active listening as patient discussed concerns related to antidepressant medication and side effects. Clinician requested for homework patient continue thought record, practice utilizing socratic questions to challenge negative thoughts/thought distortions, maintain worry journal, and practice identifying one positive for every negative thought patient experiences.    Collaboration of Care: Other not required at this time   Diagnosis:  Mixed obsessional thoughts and acts   Generalized anxiety  disorder   Mild episode of recurrent major depressive disorder (HCC)     Plan: Patient is to utilize [employer] [facility], thought re-framing, relaxation techniques, mindfulness and coping strategies to decrease symptoms associated with their diagnosis. Frequency: bi-weekly  Modality: individual      Long-term goal:   Reduce overall level, frequency, and intensity of the feelings of depression and anxiety as evidenced by decrease obsessive thoughts, compulsions, worry, depressed mood from 6 to 7 days/week to 0 to 1 days/week per patient report for at least 3 consecutive months. Target Date: 07/30/24  Progress: progressing    Short-term goal:  Verbalize an understanding of the role that distorted thinking plays in creating fears, excessive worry, and ruminations.  Target Date: 01/31/24  Progress: Patient reported she feels this goal has been met.     Reduce time spent completing compulsions from 1 hour per day during the week and 4 hours per day on the weekends.  Target Date: 01/31/24  Progress: Patient reported she feels this goal has been met.    Reduce impulse to immediately complete compulsions, such as, cleaning kitchen after meals, taking clothes out of dryer once dryer is complete, and picking up children's toys  Target Date: 07/30/24  Progress: progressing    Develop and implement mindfulness exercises to increase patient's focus on being in the present  Target Date: 01/31/24  Progress: Patient reported she feels this goal has been met.    Identify, challenge, and replace negative thought patterns that contribute to feelings of depression, anxiety, and obsessive thoughts with positive thoughts and beliefs per patient's report  Target Date: 07/30/24  Progress: progressing    Doree Barthel, LCSW

## 2024-03-11 NOTE — Progress Notes (Signed)
   Doree Barthel, LCSW

## 2024-03-15 ENCOUNTER — Encounter (HOSPITAL_COMMUNITY): Payer: Self-pay | Admitting: Psychiatry

## 2024-03-15 ENCOUNTER — Telehealth (INDEPENDENT_AMBULATORY_CARE_PROVIDER_SITE_OTHER): Payer: No Typology Code available for payment source | Admitting: Psychiatry

## 2024-03-15 DIAGNOSIS — F422 Mixed obsessional thoughts and acts: Secondary | ICD-10-CM | POA: Diagnosis not present

## 2024-03-15 DIAGNOSIS — F330 Major depressive disorder, recurrent, mild: Secondary | ICD-10-CM | POA: Diagnosis not present

## 2024-03-15 DIAGNOSIS — Z79899 Other long term (current) drug therapy: Secondary | ICD-10-CM

## 2024-03-15 DIAGNOSIS — F411 Generalized anxiety disorder: Secondary | ICD-10-CM | POA: Diagnosis not present

## 2024-03-15 DIAGNOSIS — F5081 Binge eating disorder, mild: Secondary | ICD-10-CM

## 2024-03-15 MED ORDER — LISDEXAMFETAMINE DIMESYLATE 10 MG PO CAPS: 10.0000 mg | ORAL_CAPSULE | Freq: Every day | ORAL | 0 refills | Status: DC

## 2024-03-15 MED ORDER — FLUOXETINE HCL 40 MG PO CAPS: 80.0000 mg | ORAL_CAPSULE | Freq: Every day | ORAL | 2 refills | Status: DC

## 2024-03-15 NOTE — Patient Instructions (Signed)
 We decreased the Vyvanse to 10 mg once daily today.  After being on this for 1 week you can increase the fluoxetine (Prozac) to 80 mg once daily.  If the irritability is improving but still present once we go up on the dose of the fluoxetine we can try coming off of the Vyvanse completely as the increased dose of the fluoxetine should help with the binge eating craving.

## 2024-03-15 NOTE — Progress Notes (Signed)
 BH MD Outpatient Progress Note  03/15/2024 4:01 PM Stacey Houston  MRN:  [id number]  Assessment:  Stacey Houston presents for follow-up evaluation. Today, 03/15/24, patient reports being able to control the compulsions but with titration of Vyvanse has noted irritability, early awakening with repeated awakening, and more obsessive thought.  Their plan to taper Vyvanse back to 10 mg dosing and after 1 week on this will titrate fluoxetine which should also provide a benefit to impulsivity as it relates to binge eating.  May ultimately discontinue Vyvanse depending on response to this.  Consideration given to Wellbutrin given prior trial of this after postpartum depression and would be less potent action on dopamine and norepinephrine when compared with Vyvanse. Headaches have also stayed relatively minimal despite coming off of gabapentin and starting Vyvanse.  She will come by the clinic to get a urine toxicology screen before next appointment.  Given that she is failed naltrexone and Topamax due to side effects there are fairly limited options left to address outside of behavioral changes which have been bolstered by seeing the nutritionist.  She continues in psychotherapy and has made progress there to every other week.  Follow-up in 8 weeks.  Provider transition discussed.   For safety, her acute risk factors for suicide are: Current diagnosis of OCD, newborn in the home.  Her chronic risk factors for suicide are: Chronic mental health concerns, access to firearms.  Her protective factors are: Minor children living in the home, supportive family and friends, employment beloved pets, actively seeking and engaging with mental health care, no suicidal ideation, guns are secured in a gun safe.  While future events cannot be fully predicted she does not currently meet IVC criteria and can be continued as an outpatient.  Identifying Information: Stacey Houston is a 38 y.o. female with a history of  OCD, generalized anxiety disorder, postpartum depression with worsening of OCD, recurrent major depressive disorder, chronic headaches who is an established patient with [facility] participating in follow-up via video conferencing. Initial evaluation of OCD on 06/03/2023; please see that note for full case formulation.  As discussed in my chart messages the episodes with weight gain does coincide with stopping Topamax so most likely stopping the Topamax uncovered a binge eating disorder rather than a medication side effect from Prozac which should be helpful with impulsive eating.     Plan:   # OCD  generalized anxiety disorder Past medication trials:  Status of problem: improving Interventions: -- Titrate Prozac to 80 mg once daily (s6/18/24, i7/16/24, i8/19/24, i10/4/24, i4/8/25) -- Continue psychotherapy   # Mild recurrent major depressive disorder Past medication trials:  Status of problem: Chronic with mild exacerbation Interventions: -- Prozac, psychotherapy as above  # Binge eating disorder, mild  On stimulant medication Past medication trials:  Status of problem: Well-controlled Interventions: -- Taper vyvanse to 10mg  once daily (s11/5/24, i12/9/24, d3/31/25) -- Coordinate with PCP for nutrition referral -- urine drug screen before next appointment   # Chronic headaches Past medication trials: Topamax, amitriptyline Status of problem: improving Interventions: --Coordinate with PCP for neurology referral  Patient was given contact information for behavioral health clinic and was instructed to call 911 for emergencies.   Subjective:  Chief Complaint:  Chief Complaint  Patient presents with   OCD   Binge eating disorder   Anxiety   Depression   Follow-up    Interval History: Things have been pretty good since last appointment. Has been a bit more  down than she was before, however. This is her typical pattern of mood dipping at start of Spring.  OCD symptoms are still well controlled on fluoxetine but has noticed that she has been running more irritable on the Vyvanse. Happens most commonly when her 36 year old and 36 year old. This has been more consistent prior to the start of Spring. Remembers 3 years ago OB put her on Wellbutrin which may have helped mood. Reviewed Vyvanse and Wellbutrin receptor profile. With feeling more down has been having increased cravings for sugar. Notes that when she isn't down is able to challenge these thoughts more. Also noting more OCD thoughts on the Vyvanse. Does have some worry of emotional blunting on fluoxetine as did not cry as much as expected at the funeral of her grandfather. Would be amenable to titration of fluoxetine with taper of Vyvanse. Has been waking up at 4a with non-restful sleep until 550a. Still doing just lunch and dinner but will have all 3 on the weekends. No headaches/chest pain. Mealtime plate is still about half filled at this point, no weight loss. Will still have a tall Yeti of coffee until lunch time as only food (except on Sundays when having a bigger breakfast).   Visit Diagnosis:    ICD-10-CM   1. Mixed obsessional thoughts and acts  F42.2 FLUoxetine (PROZAC) 40 MG capsule    2. Generalized anxiety disorder  F41.1 FLUoxetine (PROZAC) 40 MG capsule    3. On stimulant medication  Z79.899     4. Binge eating disorder, mild  F50.810 lisdexamfetamine (VYVANSE) 10 MG capsule    FLUoxetine (PROZAC) 40 MG capsule    5. Mild episode of recurrent major depressive disorder (HCC)  F33.0 FLUoxetine (PROZAC) 40 MG capsule        Past Psychiatric History:  Diagnoses: OCD, generalized anxiety disorder, postpartum depression with worsening of OCD, recurrent major depressive disorder, binge eating disorder Medication trials: topamax (effective for headache), amitriptyline (ineffective for headache), fluoxetine (effective but possible weight gain), vyvanse (irritability, insomnia) Previous  psychiatrist/therapist: yes to therapy Hospitalizations: none Suicide attempts: none SIB: none Hx of violence towards others: none Current access to guns: yes secured in gun safe Hx of trauma/abuse: none Substance use: none  Past Medical History:  Past Medical History:  Diagnosis Date   Adjustment reaction with anxiety and depression 12/26/2022   Anemia    Anxiety    Postpartum   Chronic pelvic pain in female    Constipation 01/10/2015   Depression    Postpartum   H/O chronic cystitis    History of recurrent UTIs    History of urethral stricture    Nephrolithiasis    bilateral non-obstructive per urologist note 06-16-2017  -- dr Marlou Porch   Normal labor 11/10/2018   Pregnancy 12/11/2021   Retained placenta 11/11/2018   Rib pain 12/26/2022    Past Surgical History:  Procedure Laterality Date   AUGMENTATION MAMMAPLASTY Bilateral 2009   CHROMOPERTUBATION N/A 08/12/2017   Procedure: CHROMOPERTUBATION;  Surgeon: Mitchel Honour, DO;  Location: [facility];  Service: Gynecology;  Laterality: N/A;   CYSTO/  URETHRAL DILATION  11/09/2004   DILATION AND EVACUATION N/A 11/11/2018   Procedure: DILATATION AND EVACUATION;  Surgeon: Zelphia Cairo, MD;  Location[facility];  Service: Gynecology;  Laterality: N/A;   LAPAROSCOPY N/A 08/12/2017   Procedure: LAPAROSCOPY DIAGNOSTIC [place] OF ENDOMETRIOSI;  Surgeon: Mitchel Honour, DO;  Location: [facility];  Service: Gynecology;  Laterality: N/A;  WISDOM TOOTH EXTRACTION  03/2016    Family Psychiatric History: father history of alcoholism, uncle died from alcoholism, mother alcoholism   Family History:  Family History  Problem Relation Age of Onset   Diabetes Maternal Grandmother    Diabetes Maternal Grandfather    Cancer Paternal Grandmother    Diabetes Paternal Grandmother    Cancer Paternal Grandfather    Diabetes Paternal Grandfather     Social History:  Academic/Vocational:  Employed  Social History   Socioeconomic History   Marital status: Married    Spouse name: Not on file   Number of children: Not on file   Years of education: Not on file   Highest education level: Not on file  Occupational History   Not on file  Tobacco Use   Smoking status: Never   Smokeless tobacco: Never  Vaping Use   Vaping status: Never Used  Substance and Sexual Activity   Alcohol use: Not Currently   Drug use: Never   Sexual activity: Yes    Birth control/protection: OCP  Other Topics Concern   Not on file  Social History Narrative   Not on file   Social Drivers of Health   Financial Resource Strain: Not on file  Food Insecurity: Not on file  Transportation Needs: Not on file  Physical Activity: Not on file  Stress: Not on file  Social Connections: Not on file    Allergies:  Allergies  Allergen Reactions   Amoxicillin Hives    Has patient had a PCN reaction causing immediate rash, facial/tongue/throat swelling, SOB or lightheadedness with hypotension: Yes Has patient had a PCN reaction causing severe rash involving mucus membranes or skin necrosis: Yes Has patient had a PCN reaction that required hospitalization: No Has patient had a PCN reaction occurring within the last 10 years: No If all of the above answers are "NO", then may proceed with Cephalosporin use.   Penicillins Hives    Has patient had a PCN reaction causing immediate rash, facial/tongue/throat swelling, SOB or lightheadedness with hypotension: Yes Has patient had a PCN reaction causing severe rash involving mucus membranes or skin necrosis: Yes Has patient had a PCN reaction that required hospitalization: No Has patient had a PCN reaction occurring within the last 10 years: No If all of the above answers are "NO", then may proceed with Cephalosporin use.    Sulfa Antibiotics Hives    Current Medications: Current Outpatient Medications  Medication Sig Dispense Refill   lisdexamfetamine  (VYVANSE) 10 MG capsule Take 1 capsule (10 mg total) by mouth daily. 30 capsule 0   cyclobenzaprine (FLEXERIL) 5 MG tablet Take 5-10 mg by mouth at bedtime.     FLUoxetine (PROZAC) 40 MG capsule Take 2 capsules (80 mg total) by mouth daily. 60 capsule 2   NIKKI 3-0.02 MG tablet Take 1 tablet by mouth daily.     Prenatal Vit-Fe Fumarate-FA (PRENATAL MULTIVITAMIN) TABS tablet Take 1 tablet by mouth daily at 12 noon. 60 tablet 1   No current facility-administered medications for this visit.    ROS: Review of Systems  Constitutional:  Negative for appetite change and unexpected weight change.  Endocrine: Positive for heat intolerance. Negative for cold intolerance and polyphagia.  Neurological:  Positive for headaches.  Psychiatric/Behavioral:  Positive for sleep disturbance. Negative for decreased concentration, dysphoric mood, hallucinations, self-injury and suicidal ideas. The patient is nervous/anxious.        Ongoing obsessions and still no longer compulsions    Objective:  Psychiatric  Specialty Exam: There were no vitals taken for this visit.There is no height or weight on file to calculate BMI.  General Appearance: Neat, Well Groomed, and appears stated age  Eye Contact:  Good  Speech:  Clear and Coherent and Normal Rate  Volume:  Normal  Mood:   "Pretty good but I have been more irritable"  Affect:  Appropriate, Congruent, Constricted, and overall still bright but anxious  Thought Content: Logical, Hallucinations: None, and Rumination on sugar cravings/weight number but improving  Suicidal Thoughts:  No  Homicidal Thoughts:  No  Thought Process:  Coherent, Goal Directed, and Linear  Orientation:  Full (Time, Place, and Person)    Memory:  Grossly intact   Judgment:  Fair, difficulty with resisting binge on sugar but improving  Insight:  Good  Concentration:  Concentration: Good  Recall:  not formally assessed   Fund of Knowledge: Good  Language: Good  Psychomotor  Activity:  Normal  Akathisia:  No  AIMS (if indicated): not done  Assets:  Communication Skills Desire for Improvement Financial Resources/Insurance Housing Intimacy Leisure Time Physical Health Resilience Social Support Talents/Skills Transportation Vocational/Educational  ADL's:  Intact  Cognition: WNL  Sleep:  Fair   PE: General: sits comfortably in view of camera; no acute distress  Pulm: no increased work of breathing on room air  MSK: all extremity movements appear intact  Neuro: no focal neurological deficits observed  Gait & Station: unable to assess by video    Metabolic Disorder Labs: No results found for: "HGBA1C", "MPG" No results found for: "PROLACTIN" Lab Results  Component Value Date   CHOL 181 01/07/2024   TRIG 111.0 01/07/2024   HDL 76.80 01/07/2024   CHOLHDL 2 01/07/2024   VLDL 22.2 01/07/2024   LDLCALC 82 01/07/2024   LDLCALC 98 01/10/2015   Lab Results  Component Value Date   TSH 1.75 01/07/2024   TSH 1.24 12/26/2022    Therapeutic Level Labs: No results found for: "LITHIUM" No results found for: "VALPROATE" No results found for: "CBMZ"  Screenings:  GAD-7    Flowsheet Row Office Visit from 01/07/2024 in [facility] [place] Office Visit from 04/16/2023 in [facility]  Total GAD-7 Score 0 19      PHQ2-9    Flowsheet Row Office Visit from 01/07/2024 in [facility] [place] Nutrition from 11/27/2023 in [facility] Visit from 08/28/2023 in [facility] [place] Office Visit from 06/03/2023 in [facility] [place] Office Visit from 04/16/2023 in [facility] [place]  PHQ-2 Total Score 0 0 2 2 6   PHQ-9 Total Score 6 -- 10 6 15       Flowsheet Row ED from 01/09/2024 in [facility]  ED  from 08/18/2023 in [facility]  ED from 07/09/2023 in [facility]   C-SSRS RISK CATEGORY No Risk No Risk No Risk       Collaboration of Care: Collaboration of Care: Medication Management AEB as above, Primary Care Provider AEB as above, and Referral or follow-up with counselor/therapist AEB as above  Patient/Guardian was advised Release of Information must be obtained prior to any record release in order to collaborate their care with an outside provider. Patient/Guardian was advised if they have not already done so to contact the  registration department to sign all necessary forms in order for [place] to release information regarding their care.   Consent: Patient/Guardian gives verbal consent for treatment and assignment of benefits for services provided during this visit. Patient/Guardian expressed understanding and agreed to proceed.   Televisit via video: I connected with patient on 03/15/24 at  3:30 PM EDT by a video enabled telemedicine application and verified that I am speaking with the correct person using two identifiers.  Location: Patient: at home in [facility] Provider: remote office in [place]   I discussed the limitations of evaluation and management by telemedicine and the availability of in person appointments. The patient expressed understanding and agreed to proceed.  I discussed the assessment and treatment plan with the patient. The patient was provided an opportunity to ask questions and all were answered. The patient agreed with the plan and demonstrated an understanding of the instructions.   The patient was advised to call back or seek an in-person evaluation if the symptoms worsen or if the condition fails to improve as anticipated.  I provided 20 minutes of virtual face-to-face time during this encounter.  Elsie Lincoln, MD 03/15/2024, 4:01 PM

## 2024-03-22 ENCOUNTER — Encounter (HOSPITAL_COMMUNITY): Payer: Self-pay

## 2024-03-22 DIAGNOSIS — F330 Major depressive disorder, recurrent, mild: Secondary | ICD-10-CM

## 2024-03-22 DIAGNOSIS — F5081 Binge eating disorder, mild: Secondary | ICD-10-CM

## 2024-03-22 MED ORDER — BUPROPION HCL ER (XL) 150 MG PO TB24: 150.0000 mg | ORAL_TABLET | ORAL | 2 refills | Status: DC

## 2024-03-26 ENCOUNTER — Ambulatory Visit: Admitting: Clinical

## 2024-03-26 DIAGNOSIS — F422 Mixed obsessional thoughts and acts: Secondary | ICD-10-CM

## 2024-03-26 DIAGNOSIS — F411 Generalized anxiety disorder: Secondary | ICD-10-CM

## 2024-03-26 DIAGNOSIS — F330 Major depressive disorder, recurrent, mild: Secondary | ICD-10-CM

## 2024-03-26 NOTE — Progress Notes (Signed)
   Doree Barthel, LCSW

## 2024-03-26 NOTE — Progress Notes (Signed)
 [facility] Counselor/Therapist Progress Note  Patient ID: SURIYA KOVARIK, MRN: [id number],    Date: 03/26/2024  Time Spent: 8:36am - 9:23am : 47 minutes   Treatment Type: Individual Therapy  Reported Symptoms: ruminating thoughts   Mental Status Exam: Appearance:  Neat and Well Groomed     Behavior: Appropriate  Motor: Normal  Speech/Language:  Clear and Coherent and Normal Rate  Affect: Appropriate  Mood: normal  Thought process: normal  Thought content:   Rumination  Sensory/Perceptual disturbances:   WNL  Orientation: oriented to person, place, situation, and day of week  Attention: Good  Concentration: Good  Memory: WNL  Fund of knowledge:  Good  Insight:   Good  Judgment:  Good  Impulse Control: Good   Risk Assessment: Danger to Self:  No Patient denied current suicidal ideation  Self-injurious Behavior: No Danger to Others: No Patient denied current homicidal ideation Duty to Warn:no Physical Aggression / Violence:No  Access to Firearms a concern: No  Gang Involvement:No   Subjective: Patient stated, "pretty good" in response to events since last session. Patient reported she had an appointment with patient's psychiatrist recently and psychiatrist increases patient's Prozac to 80 mg and decreased vyvanse to 10mg .  Patient reported she felt agitated when taking vyvanse and reported she messaged her psychiatrist and asked to discontinue vyvanse. Patient reported psychiatrist started patient on wellbutrin XL, 150mg  in the mornings. Patient stated, "I think my sleep is a little better, I think I've been a little bit more laid back than I have been recently". Patient stated, "they might be a little bit better" in reference to obsessive thoughts and compulsions. Patient stated, "I would say upping the prozac has been positive and starting the wellbutrin". Patient stated, "I did have a really low Saturday, he (husband) could tell I was really down". Patient  reported she did not attend a social function due to depressed mood and patient stated, "that was very out of character for me". Patient reported patient/husband had a conversation about the list of home repairs after decline in patient's mood. Patient stated, "seeing my stuff starting to bloom and I really wanted several things to be done so we could enjoy it" as a trigger for recent depressed mood. Patient stated, "its not been done for the past 6 years, it's never going to be done, my kids are going to be grown before they can enjoy their play ground". Patient stated, "I don't guess anything bad would happen, I would probably be very upset and resentful". Patient stated, "I think what's likely to happen is that they (repairs) will get done". Patient stated, "I'm afraid that I would dwell on it" in response to if repairs were not completed. Patient reported over the weekend patient was able to ride their four wheeler and stated, "It was just fun", "It was the first time in a while I have taken time to do stuff like that instead of doing chores". Patient stated, "I think a small part is the list but the bigger part is something I want done", "I don't have any control". Patient stated, "that's a big one for me is the perfection or the all or nothing". Patient stated, "I feel like my mood is really good today" in response to current mood.   Interventions: Cognitive Behavioral Therapy. Clinician conducted session via caregility video from clinician's home office. Patient provided verbal consent to proceed with telehealth session and is aware of limitations of telephone or video visits. Patient participated  in session from patient's vehicle. Reviewed events since last session and assessed for changes. Discussed patient's recent appointment with patient's psychiatrist and recent changes in medications. Assessed changes in symptoms since change in medications occurred. Provided psycho education related to CBT,  catastrophizing, and cognitive restructuring. Explored and identified triggers for depressed mood. Assisted patient in challenging/decatastrophizing negative thoughts/cognitive distortions associated with list of home repairs. Clinician requested for homework patient practice challenging thoughts associated with feelings of loss of control and all or nothing thoughts.   Collaboration of Care: Other not required at this time   Diagnosis:  Mixed obsessional thoughts and acts   Generalized anxiety disorder   Mild episode of recurrent major depressive disorder (HCC)     Plan: Patient is to utilize [employer] [facility], thought re-framing, relaxation techniques, mindfulness and coping strategies to decrease symptoms associated with their diagnosis. Frequency: bi-weekly  Modality: individual      Long-term goal:   Reduce overall level, frequency, and intensity of the feelings of depression and anxiety as evidenced by decrease obsessive thoughts, compulsions, worry, depressed mood from 6 to 7 days/week to 0 to 1 days/week per patient report for at least 3 consecutive months. Target Date: 07/30/24  Progress: progressing    Short-term goal:  Verbalize an understanding of the role that distorted thinking plays in creating fears, excessive worry, and ruminations.  Target Date: 01/31/24  Progress: Patient reported she feels this goal has been met.     Reduce time spent completing compulsions from 1 hour per day during the week and 4 hours per day on the weekends.  Target Date: 01/31/24  Progress: Patient reported she feels this goal has been met.    Reduce impulse to immediately complete compulsions, such as, cleaning kitchen after meals, taking clothes out of dryer once dryer is complete, and picking up children's toys  Target Date: 07/30/24  Progress: progressing    Develop and implement mindfulness exercises to increase patient's focus on being in the present  Target Date: 01/31/24   Progress: Patient reported she feels this goal has been met.    Identify, challenge, and replace negative thought patterns that contribute to feelings of depression, anxiety, and obsessive thoughts with positive thoughts and beliefs per patient's report  Target Date: 07/30/24  Progress: progressing     Doree Barthel, LCSW

## 2024-04-08 ENCOUNTER — Ambulatory Visit: Admitting: Clinical

## 2024-04-23 ENCOUNTER — Ambulatory Visit (INDEPENDENT_AMBULATORY_CARE_PROVIDER_SITE_OTHER): Admitting: Clinical

## 2024-04-23 DIAGNOSIS — F411 Generalized anxiety disorder: Secondary | ICD-10-CM

## 2024-04-23 DIAGNOSIS — F422 Mixed obsessional thoughts and acts: Secondary | ICD-10-CM | POA: Diagnosis not present

## 2024-04-23 DIAGNOSIS — F330 Major depressive disorder, recurrent, mild: Secondary | ICD-10-CM

## 2024-04-23 NOTE — Progress Notes (Signed)
   Stacey Barthel, LCSW

## 2024-04-23 NOTE — Progress Notes (Signed)
 [facility] Counselor/Therapist Progress Note  Patient ID: Stacey Houston, MRN: [id number],    Date: 04/23/2024  Time Spent: 3:34pm - 4:21pm : 47 minutes   Treatment Type: Individual Therapy  Reported Symptoms: Patient reported feeling "blah"  Mental Status Exam: Appearance:  Neat and Well Groomed     Behavior: Appropriate  Motor: Normal  Speech/Language:  Clear and Coherent and Normal Rate  Affect: Appropriate  Mood: Patient reported feeling "blah"  Thought process: normal  Thought content:   WNL  Sensory/Perceptual disturbances:   WNL  Orientation: oriented to person, place, and situation  Attention: Good  Concentration: Good  Memory: WNL  Fund of knowledge:  Good  Insight:   Good  Judgment:  Good  Impulse Control: Good   Risk Assessment: Danger to Self:  No  Patient denied current suicidal ideation  Self-injurious Behavior: No Danger to Others: No Patient denied current homicidal ideation Duty to Warn:no Physical Aggression / Violence:No  Access to Firearms a concern: No  Gang Involvement:No   Subjective: Patient reported patient's son was sick at the time of patient's last session and patient reported she forget her appointment.  Patient stated, "it's been pretty good' in reference to events since last session. Patient reported she started taking Wellbutrin  and stated, "it does seem to help". Patient reported she noticed tremors in patient's hands and decreased patient's dosage of Prozac  to 40 mg. Patient reported she has an appointment with patient's psychiatrist on Monday and plans to discuss the side effects with psychiatrist. Patient stated, "I don't want to go off the Prozac  because it does help with my OCD".  Patient stated, "the list is slowly going, it still bugs me", "its a little better than it was". Patient stated, "I think it makes my mood lower" in reference to the list of home repairs. Patient stated, "I feel like I'm going to have like this  weight lifted" in reference to completion of the list. Patient reported feelings of resentment regarding the list. Patient stated, "I dislike that it took so many years for the list to be important to him (husband)". Patient stated, "it is very un beneficial to me to think about the list". Patient identified multiple costs related to feelings of resentment. Patient stated, "I don't think so" in response to benefits of resentment. Patient stated, "I have joy but it's not like it use to be".  Patient reported "blah" mood. Patient reported she plans to write her gratitude journal in on her calendar.   Interventions: Cognitive Behavioral Therapy. Clinician conducted session via caregility video from clinician's home office. Patient provided verbal consent to proceed with telehealth session and is aware of limitations of telephone or video visits. Patient participated in session from patient's vehicle. Discussed recent missed appointment and barriers to attendance. Reviewed events since last session and assessed for changes. Discussed patient's concerns regarding potential side effects of medication. Discussed patient following up with patient's psychiatrist to discuss patient's medication concerns. Provided psycho education related to antidepressant medications. Processed patient's thoughts/feelings associated with list of household repairs and the impact on patient's mood. Provided psycho education related to cognitive distortions, such as, disqualifying the positives. Challenged cognitive distortions to assist patient in reframing situation regarding the list of repairs. Reviewed challenging cognitive distortions/negative thoughts. Provided psycho education related to cost/benefit analysis and assisted patient in completing cost/benefit analysis.  Provided psycho education related to Major Depressive Disorder and depressive symptoms. Clinician requested for homework patient practice challenging thoughts associated  with feelings  of loss of control and cognitive distortions, identify one positive for every negative thought and start a gratitude journal.   Collaboration of Care: Other not required at this time   Diagnosis:  Mixed obsessional thoughts and acts   Generalized anxiety disorder   Mild episode of recurrent major depressive disorder (HCC)     Plan: Patient is to utilize [employer] [facility], thought re-framing, relaxation techniques, mindfulness and coping strategies to decrease symptoms associated with their diagnosis. Frequency: bi-weekly  Modality: individual      Long-term goal:   Reduce overall level, frequency, and intensity of the feelings of depression and anxiety as evidenced by decrease obsessive thoughts, compulsions, worry, depressed mood from 6 to 7 days/week to 0 to 1 days/week per patient report for at least 3 consecutive months. Target Date: 07/30/24  Progress: progressing    Short-term goal:  Verbalize an understanding of the role that distorted thinking plays in creating fears, excessive worry, and ruminations.  Target Date: 01/31/24  Progress: Patient reported she feels this goal has been met.     Reduce time spent completing compulsions from 1 hour per day during the week and 4 hours per day on the weekends.  Target Date: 01/31/24  Progress: Patient reported she feels this goal has been met.    Reduce impulse to immediately complete compulsions, such as, cleaning kitchen after meals, taking clothes out of dryer once dryer is complete, and picking up children's toys  Target Date: 07/30/24  Progress: progressing    Develop and implement mindfulness exercises to increase patient's focus on being in the present  Target Date: 01/31/24  Progress: Patient reported she feels this goal has been met.    Identify, challenge, and replace negative thought patterns that contribute to feelings of depression, anxiety, and obsessive thoughts with positive thoughts and beliefs  per patient's report  Target Date: 07/30/24  Progress: progressing     Burlene Carpen, LCSW

## 2024-04-26 ENCOUNTER — Encounter (HOSPITAL_COMMUNITY): Payer: Self-pay | Admitting: Psychiatry

## 2024-04-26 ENCOUNTER — Telehealth (INDEPENDENT_AMBULATORY_CARE_PROVIDER_SITE_OTHER): Admitting: Psychiatry

## 2024-04-26 DIAGNOSIS — F411 Generalized anxiety disorder: Secondary | ICD-10-CM

## 2024-04-26 DIAGNOSIS — F5081 Binge eating disorder, mild: Secondary | ICD-10-CM | POA: Diagnosis not present

## 2024-04-26 DIAGNOSIS — F330 Major depressive disorder, recurrent, mild: Secondary | ICD-10-CM | POA: Diagnosis not present

## 2024-04-26 DIAGNOSIS — F422 Mixed obsessional thoughts and acts: Secondary | ICD-10-CM | POA: Diagnosis not present

## 2024-04-26 MED ORDER — BUPROPION HCL ER (XL) 150 MG PO TB24
150.0000 mg | ORAL_TABLET | ORAL | 1 refills | Status: DC
Start: 2024-04-26 — End: 2024-08-30

## 2024-04-26 MED ORDER — FLUOXETINE HCL 40 MG PO CAPS: 40.0000 mg | ORAL_CAPSULE | Freq: Every day | ORAL | 1 refills | Status: DC

## 2024-04-26 NOTE — Progress Notes (Signed)
 [facility] MD Outpatient Progress Note  04/26/2024 4:33 PM Stacey Houston  MRN:  [id number]  Assessment:  Stacey Houston presents for follow-up evaluation. Today, 04/26/24, patient reports little to no libido with titration of fluoxetine  and will decrease the dose now that she is on Wellbutrin .  Reported for the first time slight tremor of the hands which is described as shaky sensation so difficult to determine if this is from Prozac  or Vyvanse /Wellbutrin .  Previously 20 mg of Vyvanse  was effective at controlling binge eating but led to significant irritability, early awakening with repeated awakening, and more obsessive thought.  Now unfortunately titrated fluoxetine  having side effects as above.  Headaches are still overall well-controlled.  Given that she is failed naltrexone  and Topamax  due to side effects there are fairly limited options left to address outside of behavioral changes which have been bolstered by seeing the nutritionist and encouraged more frequent therapy appointments and than every 3 weeks given significant cognitions around weight and body habitus.  No follow-up planned due to provider transition.   For safety, her acute risk factors for suicide are: Current diagnosis of OCD.  Her chronic risk factors for suicide are: Chronic mental health concerns, access to firearms.  Her protective factors are: Minor children living in the home, supportive family and friends, employment beloved pets, actively seeking and engaging with mental health care, no suicidal ideation, guns are secured in a gun safe.  While future events cannot be fully predicted she does not currently meet IVC criteria and can be continued as an outpatient.  Identifying Information: Stacey Houston is a 38 y.o. female with a history of OCD, generalized anxiety disorder, postpartum depression with worsening of OCD, recurrent major depressive disorder, chronic headaches who is an established patient with [facility] participating in follow-up via video conferencing. Initial evaluation of OCD on 06/03/2023; please see that note for full case formulation.  As discussed in my chart messages the episodes with weight gain does coincide with stopping Topamax  so most likely stopping the Topamax  uncovered a binge eating disorder rather than a medication side effect from Prozac  which should be helpful with impulsive eating.     Plan:   # OCD  generalized anxiety disorder Past medication trials:  Status of problem: Chronic and stable Interventions: -- Decrease Prozac  to 40 mg once daily (s6/18/24, i7/16/24, i8/19/24, i10/4/24, i4/8/25, d5/12/25) --Continue Wellbutrin  XL 150 mg once daily  -- Continue psychotherapy   # Mild recurrent major depressive disorder Past medication trials:  Status of problem: Chronic and stable Interventions: -- Prozac , Wellbutrin , psychotherapy as above  # Binge eating disorder, mild Past medication trials:  Status of problem: Chronic and stable Interventions: -- Wellbutrin , fluoxetine  as above -- Continue with nutrition/psychotherapy   # Chronic headaches  TMJ Past medication trials: Topamax , amitriptyline  Status of problem: Well-controlled Interventions: --Coordinate with PCP for neurology referral -- Continue Flexeril  per PCP  Patient was given contact information for behavioral health clinic and was instructed to call 911 for emergencies.   Subjective:  Chief Complaint:  Chief Complaint  Patient presents with   Eating Disorder   Anxiety   OCD   Stress   Follow-up    Interval History: Things have been pretty good since last appointment. Was started on Wellbutrin  in between appointments and thinks she is doing well on that. Asked about decreasing the dose of prozac  now that she is on wellbutrin  due to noticing some shakiness of hands. Similiarly, libido has been reduced  to zero. Thinks the shakiness preceded wellbutrin  start. Not noticing the  irritability or worsening headaches with being on the wellbutrin  that was on vyvanse .  OCD symptoms are still well controlled and denies acting on obsessive thoughts that can still occur. Does see a therapist every few weeks, Stacey Houston, and is doing challenge steps to the thoughts. Does still have cravings for sugar but went to a wellness doctor who checked her hormone levels. When she experienced a more depressed episode did have some binge episodes but none in the last few weeks. Does still have cognitions around body weight/habitus with disappointment in looks. Mealtime plate is still about half filled at this point, no weight loss; will still have a tall Yeti of coffee until lunch time as only food with no breakfast.  Still struggling with restful sleep.   Visit Diagnosis:    ICD-10-CM   1. Binge eating disorder, mild  F50.810 buPROPion  (WELLBUTRIN  XL) 150 MG 24 hr tablet    FLUoxetine  (PROZAC ) 40 MG capsule    2. Mild episode of recurrent major depressive disorder (HCC)  F33.0 buPROPion  (WELLBUTRIN  XL) 150 MG 24 hr tablet    FLUoxetine  (PROZAC ) 40 MG capsule    3. Generalized anxiety disorder  F41.1 buPROPion  (WELLBUTRIN  XL) 150 MG 24 hr tablet    FLUoxetine  (PROZAC ) 40 MG capsule    4. Mixed obsessional thoughts and acts  F42.2 FLUoxetine  (PROZAC ) 40 MG capsule         Past Psychiatric History:  Diagnoses: OCD, generalized anxiety disorder, postpartum depression with worsening of OCD, recurrent major depressive disorder, binge eating disorder Medication trials: topamax  (effective for headache), amitriptyline  (ineffective for headache), fluoxetine  (effective but possible weight gain), vyvanse  (irritability, insomnia) Previous psychiatrist/therapist: yes to therapy Hospitalizations: none Suicide attempts: none SIB: none Hx of violence towards others: none Current access to guns: yes secured in gun safe Hx of trauma/abuse: none Substance use: none  Past Medical History:  Past  Medical History:  Diagnosis Date   Adjustment reaction with anxiety and depression 12/26/2022   Anemia    Anxiety    Postpartum   Chronic pelvic pain in female    Constipation 01/10/2015   Depression    Postpartum   H/O chronic cystitis    History of recurrent UTIs    History of urethral stricture    Nephrolithiasis    bilateral non-obstructive per urologist note 06-16-2017  -- dr Dulcy Gibney   Normal labor 11/10/2018   On stimulant medication 10/21/2023   Pregnancy 12/11/2021   Retained placenta 11/11/2018   Rib pain 12/26/2022    Past Surgical History:  Procedure Laterality Date   AUGMENTATION MAMMAPLASTY Bilateral 2009   CHROMOPERTUBATION N/A 08/12/2017   Procedure: CHROMOPERTUBATION;  Surgeon: Dyanna Glasgow, DO;  Location: [facility];  Service: Gynecology;  Laterality: N/A;   CYSTO/  URETHRAL DILATION  11/09/2004   DILATION AND EVACUATION N/A 11/11/2018   Procedure: DILATATION AND EVACUATION;  Surgeon: Ashby Lawman, MD;  Location: [facility];  Service: Gynecology;  Laterality: N/A;   LAPAROSCOPY N/A 08/12/2017   Procedure: LAPAROSCOPY DIAGNOSTIC [place] OF ENDOMETRIOSI;  Surgeon: Dyanna Glasgow, DO;  Location: [facility];  Service: Gynecology;  Laterality: N/A;   WISDOM TOOTH EXTRACTION  03/2016    Family Psychiatric History: father history of alcoholism, uncle died from alcoholism, mother alcoholism   Family History:  Family History  Problem Relation Age of Onset   Diabetes Maternal Grandmother    Diabetes Maternal Grandfather  Cancer Paternal Grandmother    Diabetes Paternal Grandmother    Cancer Paternal Grandfather    Diabetes Paternal Grandfather     Social History:  Academic/Vocational: Employed  Social History   Socioeconomic History   Marital status: Married    Spouse name: Not on file   Number of children: Not on file   Years of education: Not on file   Highest education level: Not on file  Occupational  History   Not on file  Tobacco Use   Smoking status: Never   Smokeless tobacco: Never  Vaping Use   Vaping status: Never Used  Substance and Sexual Activity   Alcohol use: Not Currently   Drug use: Never   Sexual activity: Yes    Birth control/protection: OCP  Other Topics Concern   Not on file  Social History Narrative   Not on file   Social Drivers of Health   Financial Resource Strain: Not on file  Food Insecurity: Not on file  Transportation Needs: Not on file  Physical Activity: Not on file  Stress: Not on file  Social Connections: Not on file    Allergies:  Allergies  Allergen Reactions   Amoxicillin Hives    Has patient had a PCN reaction causing immediate rash, facial/tongue/throat swelling, SOB or lightheadedness with hypotension: Yes Has patient had a PCN reaction causing severe rash involving mucus membranes or skin necrosis: Yes Has patient had a PCN reaction that required hospitalization: No Has patient had a PCN reaction occurring within the last 10 years: No If all of the above answers are "NO", then may proceed with Cephalosporin use.   Penicillins Hives    Has patient had a PCN reaction causing immediate rash, facial/tongue/throat swelling, SOB or lightheadedness with hypotension: Yes Has patient had a PCN reaction causing severe rash involving mucus membranes or skin necrosis: Yes Has patient had a PCN reaction that required hospitalization: No Has patient had a PCN reaction occurring within the last 10 years: No If all of the above answers are "NO", then may proceed with Cephalosporin use.    Sulfa Antibiotics Hives    Current Medications: Current Outpatient Medications  Medication Sig Dispense Refill   buPROPion  (WELLBUTRIN  XL) 150 MG 24 hr tablet Take 1 tablet (150 mg total) by mouth every morning. 90 tablet 1   cyclobenzaprine  (FLEXERIL ) 5 MG tablet Take 5-10 mg by mouth at bedtime.     FLUoxetine  (PROZAC ) 40 MG capsule Take 1 capsule (40 mg  total) by mouth daily. 90 capsule 1   NIKKI 3-0.02 MG tablet Take 1 tablet by mouth daily.     Prenatal Vit-Fe Fumarate-FA (PRENATAL MULTIVITAMIN) TABS tablet Take 1 tablet by mouth daily at 12 noon. 60 tablet 1   No current facility-administered medications for this visit.    ROS: Review of Systems  Constitutional:  Positive for appetite change. Negative for unexpected weight change.  Endocrine: Positive for heat intolerance and polyphagia. Negative for cold intolerance.  Genitourinary:        Low libido  Neurological:  Positive for headaches.  Psychiatric/Behavioral:  Positive for sleep disturbance. Negative for decreased concentration, dysphoric mood, hallucinations, self-injury and suicidal ideas. The patient is nervous/anxious.        Ongoing obsessions and still no longer compulsions    Objective:  Psychiatric Specialty Exam: There were no vitals taken for this visit.There is no height or weight on file to calculate BMI.  General Appearance: Neat, Well Groomed, and appears stated age  [place]  Contact:  Good  Speech:  Clear and Coherent and Normal Rate  Volume:  Normal  Mood:  "Pretty good"  Affect:  Appropriate, Congruent, Constricted, and overall still bright but anxious  Thought Content: Logical, Hallucinations: None, and Rumination on sugar cravings/weight number  Suicidal Thoughts:  No  Homicidal Thoughts:  No  Thought Process:  Coherent, Goal Directed, and Linear  Orientation:  Full (Time, Place, and Person)    Memory:  Grossly intact   Judgment:  Fair, difficulty with resisting binge on sugar  Insight:  Good  Concentration:  Concentration: Good  Recall:  not formally assessed   Fund of Knowledge: Good  Language: Good  Psychomotor Activity:  Normal  Akathisia:  No  AIMS (if indicated): not done  Assets:  Communication Skills Desire for Improvement Financial Resources/Insurance Housing Intimacy Leisure Time Physical Health Resilience Social  Support Talents/Skills Transportation Vocational/Educational  ADL's:  Intact  Cognition: WNL  Sleep:  Fair   PE: General: sits comfortably in view of camera; no acute distress  Pulm: no increased work of breathing on room air  MSK: all extremity movements appear intact  Neuro: no focal neurological deficits observed  Gait & Station: unable to assess by video    Metabolic Disorder Labs: No results found for: "HGBA1C", "MPG" No results found for: "PROLACTIN" Lab Results  Component Value Date   CHOL 181 01/07/2024   TRIG 111.0 01/07/2024   HDL 76.80 01/07/2024   CHOLHDL 2 01/07/2024   VLDL 22.2 01/07/2024   LDLCALC 82 01/07/2024   LDLCALC 98 01/10/2015   Lab Results  Component Value Date   TSH 1.75 01/07/2024   TSH 1.24 12/26/2022    Therapeutic Level Labs: No results found for: "LITHIUM" No results found for: "VALPROATE" No results found for: "CBMZ"  Screenings:  GAD-7    Flowsheet Row Office Visit from 01/07/2024 in [facility] [place] Office Visit from 04/16/2023 in [facility]  Total GAD-7 Score 0 19      PHQ2-9    Flowsheet Row Office Visit from 01/07/2024 in [facility] [place] Nutrition from 11[facility]27/2023 in [facility] Visit from 08/28/2023 in [facility] [place] [facility] [place] Office Visit from 06/03/2023 in [facility] [place] Office Visit from 04/16/2023 in [facility] [place]  PHQ-2 Total Score 0 0 2 2 6   PHQ-9 Total Score 6 -- 10 6 15       Flowsheet Row UC from 01/09/2024 in [facility]  UC from 08/18/2023 in [facility]  UC from 07/09/2023 in [facility]   C-SSRS RISK CATEGORY No Risk No Risk No Risk       Collaboration of Care: Collaboration of Care:  Medication Management AEB as above, Primary Care Provider AEB as above, and Referral or follow-up with counselor/therapist AEB as above  Patient/Guardian was advised Release of Information must be obtained prior to any record release in order to collaborate their care with an outside provider. Patient/Guardian was advised if they have not already done so to contact the registration department to sign all necessary forms in order for us  to release information regarding their care.   Consent: Patient/Guardian gives verbal consent for treatment and assignment of benefits for services provided during this visit. Patient/Guardian expressed understanding and agreed to proceed.  Televisit via video: I connected with patient on 04/26/24 at  4:00 PM EDT by a video enabled telemedicine application and verified that I am speaking with the correct person using two identifiers.  Location: Patient: at home in [facility] Provider: remote office in [place]   I discussed the limitations of evaluation and management by telemedicine and the availability of in person appointments. The patient expressed understanding and agreed to proceed.  I discussed the assessment and treatment plan with the patient. The patient was provided an opportunity to ask questions and all were answered. The patient agreed with the plan and demonstrated an understanding of the instructions.   The patient was advised to call back or seek an in-person evaluation if the symptoms worsen or if the condition fails to improve as anticipated.  I provided 30 minutes of virtual face-to-face time during this encounter.  Madie Schilling, MD 04/26/2024, 4:33 PM

## 2024-04-26 NOTE — Patient Instructions (Signed)
 We decreased fluoxetine  (Prozac ) to 40 mg once daily today.  To try and discuss with your therapist about being seen more frequently to review the cognitions that we were discussing today around weight and good/bad foods.

## 2024-05-17 ENCOUNTER — Ambulatory Visit (INDEPENDENT_AMBULATORY_CARE_PROVIDER_SITE_OTHER): Admitting: Clinical

## 2024-05-17 DIAGNOSIS — F330 Major depressive disorder, recurrent, mild: Secondary | ICD-10-CM

## 2024-05-17 DIAGNOSIS — F411 Generalized anxiety disorder: Secondary | ICD-10-CM

## 2024-05-17 DIAGNOSIS — F422 Mixed obsessional thoughts and acts: Secondary | ICD-10-CM

## 2024-05-17 NOTE — Progress Notes (Signed)
   Doree Barthel, LCSW

## 2024-05-17 NOTE — Progress Notes (Signed)
 [facility] Counselor/Therapist Progress Note  Patient ID: Stacey Houston, MRN: [id number],    Date: 05/17/2024  Time Spent: 12:32pm - 1:22pm : 50 minutes   Treatment Type: Individual Therapy  Reported Symptoms: Patient reported "blah" mood  Mental Status Exam: Appearance:  Neat and Well Groomed     Behavior: Appropriate  Motor: Normal  Speech/Language:  Clear and Coherent and Normal Rate  Affect: Appropriate  Mood: Patient reported "blah" mood  Thought process: normal  Thought content:   WNL  Sensory/Perceptual disturbances:   WNL  Orientation: oriented to person, place, time/date, and situation  Attention: Good  Concentration: Good  Memory: WNL  Fund of knowledge:  Good  Insight:   Good  Judgment:  Good  Impulse Control: Good   Risk Assessment: Danger to Self:  No Patient denied current suicidal ideation  Self-injurious Behavior: No Danger to Others: No Patient denied current homicidal ideation Duty to Warn:no Physical Aggression / Violence:No  Access to Firearms a concern: No  Gang Involvement:No   Subjective: Patient stated, "good" in response to events since last session. Patient reported during a recent appointment patient was notified of low testosterone levels and reported she has been receiving testosterone injections for the past 3 weeks. Patient stated, "everything else is going pretty good, I did get a little bit flustered about my list". Patient reported patient's list of household repairs have not been completed. Patient reported she has discontinued taking Wellbutrin  and reported she felt the Wellbutrin  increased symptoms of OCD and she did not see benefits from the Wellbutrin . Patient reported patient's psychiatrist decreased patient's Prozac  dosage to 40mg  daily during a recent appointment. Patient stated, "my OCD thoughts have gotten better" in reference to symptoms since change in medications. Patient stated, "being outside always puts me in  a better mood" and reported yesterday patient's mood was normal. Patient reported feeling "blah" for the majority of the days over the last 3 to 4 weeks. Patient stated, "making all these changes and waiting for them to take place" and reported loss of patient's grandfather, grandmother adjusting to the loss of patient's grandfather have contributed to feeling "blah". Patient reported she feels the list of home repairs is the primary trigger for depressive symptoms. Patient stated, "I've gotten pretty good at that I think" in response to identifying a positive for every negative thought. Patient stated, "its going well" in reference to patient's gratitude journal and reported she is writing in the gratitude journal nightly. Patient stated, "that is helpful to see everything" in reference to gratitude journal. Patient stated, "I don't do well with that one" in reference to challenging negative thoughts. Patient reported being at home and seeing the repairs to be completed are barriers to challenging negative thoughts. Patient stated, "I can't just enjoy being at home because I'm constantly seeing them (home repairs)". Patient stated, "nothing bad will happen if its not done" in reference to list of home repairs. Patient identified all or nothing thoughts associated with list of home repairs.   Interventions: Cognitive Behavioral Therapy.Clinician conducted session via caregility video from clinician's home office. Patient provided verbal consent to proceed with telehealth session and is aware of limitations of telephone or video visits. Patient participated in session from patient's vehicle. Reviewed events since last session and assessed for changes. Discussed recent medical appointments and the outcome. Explored and identified triggers for recent decline in patient's mood. Reviewed patient's homework.  Discussed barriers to patient challenging negative thoughts. Explored problem solving strategies to address  the  list of repairs. Reviewed challenging negative thoughts/all of nothing thinking. Assisted patient in practicing challenging negative thoughts. Clinician requested for homework patient practice challenging thoughts associated with cognitive distortions, identify one positive for every negative thought and continue gratitude journal.   Collaboration of Care: Other not required at this time   Diagnosis:  Mixed obsessional thoughts and acts   Generalized anxiety disorder   Mild episode of recurrent major depressive disorder (HCC)     Plan: Patient is to utilize [employer] Therapy, thought re-framing, relaxation techniques, mindfulness and coping strategies to decrease symptoms associated with their diagnosis. Frequency: bi-weekly  Modality: individual      Long-term goal:   Reduce overall level, frequency, and intensity of the feelings of depression and anxiety as evidenced by decrease obsessive thoughts, compulsions, worry, depressed mood from 6 to 7 days/week to 0 to 1 days/week per patient report for at least 3 consecutive months. Target Date: 07/30/24  Progress: progressing    Short-term goal:  Verbalize an understanding of the role that distorted thinking plays in creating fears, excessive worry, and ruminations.  Target Date: 01/31/24  Progress: Patient reported she feels this goal has been met.     Reduce time spent completing compulsions from 1 hour per day during the week and 4 hours per day on the weekends.  Target Date: 01/31/24  Progress: Patient reported she feels this goal has been met.    Reduce impulse to immediately complete compulsions, such as, cleaning kitchen after meals, taking clothes out of dryer once dryer is complete, and picking up children's toys  Target Date: 07/30/24  Progress: progressing    Develop and implement mindfulness exercises to increase patient's focus on being in the present  Target Date: 01/31/24  Progress: Patient reported she feels  this goal has been met.    Identify, challenge, and replace negative thought patterns that contribute to feelings of depression, anxiety, and obsessive thoughts with positive thoughts and beliefs per patient's report  Target Date: 07/30/24  Progress: progressing     Burlene Carpen, LCSW

## 2024-05-31 ENCOUNTER — Ambulatory Visit (INDEPENDENT_AMBULATORY_CARE_PROVIDER_SITE_OTHER): Admitting: Clinical

## 2024-05-31 DIAGNOSIS — F330 Major depressive disorder, recurrent, mild: Secondary | ICD-10-CM

## 2024-05-31 DIAGNOSIS — F422 Mixed obsessional thoughts and acts: Secondary | ICD-10-CM

## 2024-05-31 DIAGNOSIS — F411 Generalized anxiety disorder: Secondary | ICD-10-CM | POA: Diagnosis not present

## 2024-05-31 NOTE — Progress Notes (Signed)
   Stacey Barthel, LCSW

## 2024-05-31 NOTE — Progress Notes (Signed)
 [facility] Counselor/Therapist Progress Note  Patient ID: Stacey Houston, MRN: [id number],    Date: 05/31/2024  Time Spent: 12:32pm - 1:23pm : 51 minutes   Treatment Type: Individual Therapy  Reported Symptoms: Patient reported recent increase in energy and improvement in mood  Mental Status Exam: Appearance:  Neat and Well Groomed     Behavior: Appropriate  Motor: Normal  Speech/Language:  Clear and Coherent and Normal Rate  Affect: Appropriate  Mood: normal  Thought process: normal  Thought content:   WNL  Sensory/Perceptual disturbances:   WNL  Orientation: oriented to person, place, time/date, and situation  Attention: Good  Concentration: Good  Memory: WNL  Fund of knowledge:  Good  Insight:   Good  Judgment:  Good  Impulse Control: Good   Risk Assessment: Danger to Self:  No Patient denied current suicidal ideation  Self-injurious Behavior: No Danger to Others: No Patient denied current homicidal ideation Duty to Warn:no Physical Aggression / Violence:No  Access to Firearms a concern: No  Gang Involvement:No   Subjective: Patient stated, good in response to events since last session. Patient stated, I am in a much better mood than I was, I don't know if it's because of the testosterone is kicking in and making me have energy, also my husband is almost finished with one of the things on my list.  Patient reported patient has observed an increase in energy and improvement in mood recently. Patient reported patient was able to go with the flow during the recent weekend and patient reported patient did not experience worry related to cleaning patient's house and maintaining a schedule. Patient reported patient recently took two days off from work for patient's birthday and reported feeling having two days off from work contributed to recent improvement in mood. Patient stated, I don't really have that much self care now. Patient reported  patient's self care routine is watching television/eating popcorn in the evenings with patient's husband. Patient stated, I think mine is probably a lot of burn out in reference to limited time for self care. Patient stated, I think that would be a great idea, I think it would probably help me in reference to strategies to promote self care. Patient stated, I'm doing a pretty good job of that I think in reference to patient's homework to identify one positive for each negative thought.   Interventions: Cognitive Behavioral Therapy. Clinician conducted session via caregility video from clinician's home office. Patient provided verbal consent to proceed with telehealth session and is aware of limitations of telephone or video visits. Patient participated in session from patient's vehicle. Reviewed events since last session and assessed for changes. Discussed recent changes in patient's mood and energy. Explored and identified contributing factors to recent improvement in symptoms. Explored patient's current self care routine. Provided psycho education related to self care. Explored and identified strategies to promote self care, such as,  scheduling time for self care/coordinating child care, increasing dinner with friends, spending 30 minutes one evening each week for self care. Reviewed patient's homework. Clinician requested for homework patient practice challenging thoughts associated with cognitive distortions, identify one positive for every negative thought and practice self care.    Collaboration of Care: Other not required at this time   Diagnosis:  Mixed obsessional thoughts and acts   Generalized anxiety disorder   Mild episode of recurrent major depressive disorder (HCC)     Plan: Patient is to utilize [employer] Therapy, thought re-framing, relaxation techniques, mindfulness and  coping strategies to decrease symptoms associated with their diagnosis. Frequency: bi-weekly   Modality: individual      Long-term goal:   Reduce overall level, frequency, and intensity of the feelings of depression and anxiety as evidenced by decrease obsessive thoughts, compulsions, worry, depressed mood from 6 to 7 days/week to 0 to 1 days/week per patient report for at least 3 consecutive months. Target Date: 07/30/24  Progress: progressing    Short-term goal:  Verbalize an understanding of the role that distorted thinking plays in creating fears, excessive worry, and ruminations.  Target Date: 01/31/24  Progress: Patient reported she feels this goal has been met.     Reduce time spent completing compulsions from 1 hour per day during the week and 4 hours per day on the weekends.  Target Date: 01/31/24  Progress: Patient reported she feels this goal has been met.    Reduce impulse to immediately complete compulsions, such as, cleaning kitchen after meals, taking clothes out of dryer once dryer is complete, and picking up children's toys  Target Date: 07/30/24  Progress: progressing    Develop and implement mindfulness exercises to increase patient's focus on being in the present  Target Date: 01/31/24  Progress: Patient reported she feels this goal has been met.    Identify, challenge, and replace negative thought patterns that contribute to feelings of depression, anxiety, and obsessive thoughts with positive thoughts and beliefs per patient's report  Target Date: 07/30/24  Progress: progressing       Burlene Carpen, LCSW

## 2024-06-23 ENCOUNTER — Encounter: Admitting: Clinical

## 2024-06-23 NOTE — Progress Notes (Signed)
 This encounter was created in error - please disregard.

## 2024-06-23 NOTE — Progress Notes (Signed)
   Doree Barthel, LCSW

## 2024-07-05 ENCOUNTER — Telehealth: Payer: Self-pay

## 2024-07-05 NOTE — Telephone Encounter (Signed)
 Patient called requesting increase her tretinoin  to Tretinoin  .05% cream,   I called Left message,  over 1 year since she has been in the office she will need a follow up visit

## 2024-07-12 ENCOUNTER — Encounter: Payer: Self-pay | Admitting: Dermatology

## 2024-07-12 ENCOUNTER — Ambulatory Visit: Admitting: Dermatology

## 2024-07-12 DIAGNOSIS — Z79899 Other long term (current) drug therapy: Secondary | ICD-10-CM

## 2024-07-12 DIAGNOSIS — Z7189 Other specified counseling: Secondary | ICD-10-CM

## 2024-07-12 DIAGNOSIS — L578 Other skin changes due to chronic exposure to nonionizing radiation: Secondary | ICD-10-CM

## 2024-07-12 DIAGNOSIS — L988 Other specified disorders of the skin and subcutaneous tissue: Secondary | ICD-10-CM

## 2024-07-12 DIAGNOSIS — L821 Other seborrheic keratosis: Secondary | ICD-10-CM | POA: Diagnosis not present

## 2024-07-12 DIAGNOSIS — W908XXA Exposure to other nonionizing radiation, initial encounter: Secondary | ICD-10-CM | POA: Diagnosis not present

## 2024-07-12 MED ORDER — TRETINOIN 0.05 % EX CREA: TOPICAL_CREAM | CUTANEOUS | 11 refills | Status: AC

## 2024-07-12 NOTE — Patient Instructions (Addendum)
 Start Tretinoin  0.05% cream pea-sized amount to face at bedtime, wash off in morning.   Topical retinoid medications like tretinoin /Retin-A , adapalene/Differin, tazarotene/Fabior, and Epiduo/Epiduo Forte can cause dryness and irritation when first started. Only apply a pea-sized amount to the entire affected area. Avoid applying it around the eyes, edges of mouth and creases at the nose. If you experience irritation, use a good moisturizer first and/or apply the medicine less often. If you are doing well with the medicine, you can increase how often you use it until you are applying every night. Be careful with sun protection while using this medication as it can make you sensitive to the sun. This medicine should not be used by pregnant women.    Recommend daily broad spectrum sunscreen SPF 30+ to sun-exposed areas, reapply every 2 hours as needed. Call for new or changing lesions.  Staying in the shade or wearing long sleeves, sun glasses (UVA+UVB protection) and wide brim hats (4-inch brim around the entire circumference of the hat) are also recommended for sun protection.       Due to recent changes in healthcare laws, you may see results of your pathology and/or laboratory studies on MyChart before the doctors have had a chance to review them. We understand that in some cases there may be results that are confusing or concerning to you. Please understand that not all results are received at the same time and often the doctors may need to interpret multiple results in order to provide you with the best plan of care or course of treatment. Therefore, we ask that you please give us  2 business days to thoroughly review all your results before contacting the office for clarification. Should we see a critical lab result, you will be contacted sooner.   If You Need Anything After Your Visit  If you have any questions or concerns for your doctor, please call our main line at [phone] and press  option 4 to reach your doctor's medical assistant. If no one answers, please leave a voicemail as directed and we will return your call as soon as possible. Messages left after 4 pm will be answered the following business day.   You may also send us  a message via MyChart. We typically respond to MyChart messages within 1-2 business days.  For prescription refills, please ask your pharmacy to contact our office. Our fax number is [phone].  If you have an urgent issue when the clinic is closed that cannot wait until the next business day, you can page your doctor at the number below.    Please note that while we do our best to be available for urgent issues outside of office hours, we are not available 24/7.   If you have an urgent issue and are unable to reach us , you may choose to seek medical care at your doctor's office, retail clinic, urgent care center, or emergency room.  If you have a medical emergency, please immediately call 911 or go to the emergency department.  Pager Numbers  - Dr. Hester: [phone]  - Dr. Jackquline: [phone]  - Dr. Claudene: [phone]   In the event of inclement weather, please call our main line at [phone] for an update on the status of any delays or closures.  Dermatology Medication Tips: Please keep the boxes that topical medications come in in order to help keep track of the instructions about where and how to use these. Pharmacies typically print the medication instructions only on the boxes and  not directly on the medication tubes.   If your medication is too expensive, please contact our office at [phone] option 4 or send us  a message through MyChart.   We are unable to tell what your co-pay for medications will be in advance as this is different depending on your insurance coverage. However, we may be able to find a substitute medication at lower cost or fill out paperwork to get insurance to cover a needed medication.   If a  prior authorization is required to get your medication covered by your insurance company, please allow us  1-2 business days to complete this process.  Drug prices often vary depending on where the prescription is filled and some pharmacies may offer cheaper prices.  The website www.goodrx.com contains coupons for medications through different pharmacies. The prices here do not account for what the cost may be with help from insurance (it may be cheaper with your insurance), but the website can give you the price if you did not use any insurance.  - You can print the associated coupon and take it with your prescription to the pharmacy.  - You may also stop by our office during regular business hours and pick up a GoodRx coupon card.  - If you need your prescription sent electronically to a different pharmacy, notify our office through [facility] or by phone at [phone] option 4.     Si Usted Necesita Algo Despus de Su Visita  Tambin puede enviarnos un mensaje a travs de Clinical cytogeneticist. Por lo general respondemos a los mensajes de MyChart en el transcurso de 1 a 2 das hbiles.  Para renovar recetas, por favor pida a su farmacia que se ponga en contacto con nuestra oficina. Randi lakes de fax es [place] [phone].  Si tiene un asunto urgente cuando la clnica est cerrada y que no puede esperar hasta el siguiente da hbil, puede llamar/localizar a su doctor(a) al nmero que aparece a continuacin.   Por favor, tenga en cuenta que aunque hacemos todo lo posible para estar disponibles para asuntos urgentes fuera del horario de [place], no estamos disponibles las 24 horas del da, los 7 [address]  [address] de la [place].   Si tiene un problema urgente y no puede comunicarse con nosotros, puede optar por buscar atencin mdica  en el consultorio de su doctor(a), en una clnica privada, en un centro de atencin urgente o en una sala de emergencias.  Si tiene Engineer, drilling, por favor llame  inmediatamente al 911 o vaya a la sala de emergencias.  Nmeros de bper  - Dr. Hester: [phone]  - Dra. Jackquline: [phone]  - Dr. Claudene: [phone]   En caso de inclemencias del tiempo, por favor llame a landry capes principal al [phone] para una actualizacin sobre el [place] de cualquier retraso o cierre.  Consejos para la medicacin en dermatologa: Por favor, guarde las cajas en las que vienen los medicamentos de uso tpico para ayudarle a seguir las instrucciones sobre dnde y cmo usarlos. Las farmacias generalmente imprimen las instrucciones del medicamento slo en las cajas y no directamente en los tubos del [place].   Si su medicamento es muy caro, por favor, pngase en contacto con landry rieger llamando al [phone] y presione la opcin 4 o envenos un mensaje a travs de Clinical cytogeneticist.   No podemos decirle cul ser su copago por los medicamentos por adelantado ya que esto es diferente dependiendo de la cobertura de su seguro. Sin embargo, es posible que podamos Clinical research associate  un medicamento sustituto a Audiological scientist un formulario para que el seguro cubra el medicamento que se considera necesario.   Si se requiere una autorizacin previa para que su compaa de seguros malta su medicamento, por favor permtanos de 1 a 2 das hbiles para completar este proceso.  Los precios de los medicamentos varan con frecuencia dependiendo del Environmental consultant de dnde se surte la receta y alguna farmacias pueden ofrecer precios ms baratos.  El sitio web www.goodrx.com tiene cupones para medicamentos de Health and safety inspector. Los precios aqu no tienen en cuenta lo que podra costar con la ayuda del seguro (puede ser ms barato con su seguro), pero el sitio web puede darle el precio si no utiliz Tourist information centre manager.  - Puede imprimir el cupn correspondiente y llevarlo con su receta a la farmacia.  - Tambin puede pasar por nuestra oficina durante el horario de atencin regular y  Education officer, museum una tarjeta de cupones de GoodRx.  - Si necesita que su receta se enve electrnicamente a una farmacia diferente, informe a nuestra oficina a travs de MyChart de [place] o por telfono llamando al [phone] y presione la opcin 4.

## 2024-07-12 NOTE — Progress Notes (Unsigned)
   Follow-Up Visit   Subjective  Stacey Houston is a 38 y.o. female who presents for the following: Elastosis of skin of face. Would like to increase tretinoin  from 0.025% cream.   Check spot on left cheek bone area. Questions if it is an age spot. Dur: 1 month. Asymptomatic. No drastic changes since noticed. Hx of melasma.   The following portions of the chart were reviewed this encounter and updated as appropriate: medications, allergies, medical history  Review of Systems:  No other skin or systemic complaints except as noted in HPI or Assessment and Plan.  Objective  Well appearing patient in no apparent distress; mood and affect are within normal limits.  A focused examination was performed of the following areas: Face   Relevant exam findings are noted in the Assessment and Plan.         Assessment & Plan   FACIAL ELASTOSIS Exam: Rhytides and volume loss.  Treatment Plan: Start Tretinoin  0.05% cream apply a pea-sized amount to face at bedtime, wash off in morning.   Topical retinoid medications like tretinoin /Retin-A , adapalene/Differin, tazarotene/Fabior, and Epiduo/Epiduo Forte can cause dryness and irritation when first started. Only apply a pea-sized amount to the entire affected area. Avoid applying it around the eyes, edges of mouth and creases at the nose. If you experience irritation, use a good moisturizer first and/or apply the medicine less often. If you are doing well with the medicine, you can increase how often you use it until you are applying every night. Be careful with sun protection while using this medication as it can make you sensitive to the sun. This medicine should not be used by pregnant women.    Recommend daily broad spectrum sunscreen SPF 30+ to sun-exposed areas, reapply every 2 hours as needed. Call for new or changing lesions.  Staying in the shade or wearing long sleeves, sun glasses (UVA+UVB protection) and wide brim hats (4-inch brim  around the entire circumference of the hat) are also recommended for sun protection.    SEBORRHEIC KERATOSIS - Stuck-on, waxy, tan-brown papule 0.2 x 0.3 cm at left zygoma - Benign-appearing - Discussed benign etiology and prognosis. - Observe - Call for any changes     Return if symptoms worsen or fail to improve.  I, Jill Parcell, CMA, am acting as scribe for Alm Rhyme, MD.   Documentation: I have reviewed the above documentation for accuracy and completeness, and I agree with the above.  Alm Rhyme, MD

## 2024-07-13 ENCOUNTER — Encounter: Payer: Self-pay | Admitting: Dermatology

## 2024-08-30 ENCOUNTER — Ambulatory Visit: Payer: Self-pay

## 2024-08-30 ENCOUNTER — Ambulatory Visit (INDEPENDENT_AMBULATORY_CARE_PROVIDER_SITE_OTHER): Admitting: General Practice

## 2024-08-30 ENCOUNTER — Ambulatory Visit: Payer: Self-pay | Admitting: General Practice

## 2024-08-30 ENCOUNTER — Encounter: Payer: Self-pay | Admitting: General Practice

## 2024-08-30 VITALS — BP 120/82 | HR 97 | Temp 98.3°F | Ht 64.0 in | Wt 132.4 lb

## 2024-08-30 DIAGNOSIS — R110 Nausea: Secondary | ICD-10-CM | POA: Diagnosis not present

## 2024-08-30 DIAGNOSIS — R1011 Right upper quadrant pain: Secondary | ICD-10-CM

## 2024-08-30 LAB — POCT URINE PREGNANCY: Preg Test, Ur: NEGATIVE

## 2024-08-30 LAB — COMPREHENSIVE METABOLIC PANEL WITH GFR
ALT: 13 U/L (ref 0–35)
AST: 14 U/L (ref 0–37)
Albumin: 4 g/dL (ref 3.5–5.2)
Alkaline Phosphatase: 58 U/L (ref 39–117)
BUN: 8 mg/dL (ref 6–23)
CO2: 31 meq/L (ref 19–32)
Calcium: 9.8 mg/dL (ref 8.4–10.5)
Chloride: 102 meq/L (ref 96–112)
Creatinine, Ser: 0.62 mg/dL (ref 0.40–1.20)
GFR: 114.7 mL/min (ref >60.00)
Glucose, Bld: 87 mg/dL (ref 70–99)
Potassium: 4 meq/L (ref 3.5–5.1)
Sodium: 141 meq/L (ref 135–145)
Total Bilirubin: 0.4 mg/dL (ref 0.2–1.2)
Total Protein: 6.6 g/dL (ref 6.0–8.3)

## 2024-08-30 LAB — LIPASE: Lipase: 25 U/L (ref 11.0–59.0)

## 2024-08-30 LAB — AMYLASE: Amylase: 67 U/L (ref 27–131)

## 2024-08-30 NOTE — Patient Instructions (Addendum)
 Stop by the lab prior to leaving today. I will notify you of your results once received.   Start Famotidine  20 mg once daily at least 20 minutes before breakfast.   Orders placed for ultrasound and HIDA scan. Someone will reach out to schedule those.   F/u in two weeks with PCP.  May cancel if better.   It was a pleasure meeting you!

## 2024-08-30 NOTE — Progress Notes (Signed)
 Established Patient Office Visit  Subjective   Patient ID: Stacey Houston, female    DOB: Apr 16, 1986  Age: 38 y.o. MRN: [id number]  Chief Complaint  Patient presents with   Abdominal Pain    Right abd pain since 2 am this morning. Patient states its a little better now but was sharp stabbing pain earlier. Patient was nauseous before bed but was fine after that.     Abdominal Pain Associated symptoms include nausea. Pertinent negatives include no constipation, diarrhea, dysuria, fever, frequency, headaches or vomiting.    Stacey Houston is a 38 year old female, patient of Comer Gaskins DNP, with past medical history of GAD, OCD, frequent headaches presents today for an acute visit to discuss abdominal pain.  Discussed the use of AI scribe software for clinical note transcription with the patient, who gave verbal consent to proceed.  History of Present Illness She began experiencing severe right upper quadrant pain around 2 AM, which radiated to her chest and back. The pain was sharp, especially with deep breaths, and was described as 'really really bad'. She attempted self-treatment with Alka-Seltzer, Tums, and aspirin, and applied a heating pad while lying on her left side, which provided some relief.  She has experienced similar episodes of pain over the past two to three weeks, describing it as epigastric and sometimes resembling chest pain. The pain has been intermittent, with no specific dietary triggers identified, although she has been consuming more sugar recently. Her diet yesterday included steak, [place] fries, biscuits, gravy, bacon, and popcorn, but no spicy foods.  No nausea or vomiting in the past two weeks, except for a brief episode of nausea before bed last night, which resolved. No constipation or diarrhea. She has not had a regular menstrual period as she takes her birth control pill in three-month cycles and is currently trying to start her cycle by not taking the  pill since Friday. She also uses condoms for contraception.    Patient Active Problem List   Diagnosis Date Noted   Acute otitis media with effusion of left ear 01/15/2024   Preventative health care 01/07/2024   Binge eating disorder, mild 09/19/2023   History of strep pharyngitis 08/28/2023   Chronic TMJ pain 08/28/2023   OCD (obsessive compulsive disorder) 06/03/2023   Generalized anxiety disorder 06/03/2023   Mild episode of recurrent major depressive disorder (HCC) 06/03/2023   Frequent headaches 12/26/2022   Abnormal thyroid  blood test 12/26/2022   Past Medical History:  Diagnosis Date   Adjustment reaction with anxiety and depression 12/26/2022   Anemia    Anxiety    Postpartum   Chronic pelvic pain in female    Constipation 01/10/2015   Depression    Postpartum   H/O chronic cystitis    History of recurrent UTIs    History of urethral stricture    Nephrolithiasis    bilateral non-obstructive per urologist note 06-16-2017  -- dr cam   Normal labor 11/10/2018   On stimulant medication 10/21/2023   Pregnancy 12/11/2021   Retained placenta 11/11/2018   Rib pain 12/26/2022   Past Surgical History:  Procedure Laterality Date   AUGMENTATION MAMMAPLASTY Bilateral 2009   CHROMOPERTUBATION N/A 08/12/2017   Procedure: CHROMOPERTUBATION;  Surgeon: Dannielle Bouchard, DO;  Location: [facility];  Service: Gynecology;  Laterality: N/A;   CYSTO/  URETHRAL DILATION  11/09/2004   DILATION AND EVACUATION N/A 11/11/2018   Procedure: DILATATION AND EVACUATION;  Surgeon: Latisha Medford, MD;  Location: [facility];  Service: Gynecology;  Laterality: N/A;   LAPAROSCOPY N/A 08/12/2017   Procedure: LAPAROSCOPY DIAGNOSTIC [place] OF ENDOMETRIOSI;  Surgeon: Dannielle Bouchard, DO;  Location:  S[facility]ERY [facility];  Service: Gynecology;  Laterality: N/A;   WISDOM TOOTH EXTRACTION  03/2016   Allergies  Allergen Reactions   Amoxicillin Hives    Has patient had a  PCN reaction causing immediate rash, facial/tongue/throat swelling, SOB or lightheadedness with hypotension: Yes Has patient had a PCN reaction causing severe rash involving mucus membranes or skin necrosis: Yes Has patient had a PCN reaction that required hospitalization: No Has patient had a PCN reaction occurring within the last 10 years: No If all of the above answers are NO, then may proceed with Cephalosporin use.   Penicillins Hives    Has patient had a PCN reaction causing immediate rash, facial/tongue/throat swelling, SOB or lightheadedness with hypotension: Yes Has patient had a PCN reaction causing severe rash involving mucus membranes or skin necrosis: Yes Has patient had a PCN reaction that required hospitalization: No Has patient had a PCN reaction occurring within the last 10 years: No If all of the above answers are NO, then may proceed with Cephalosporin use.    Sulfa Antibiotics Hives         08/30/2024    9:44 AM 01/07/2024   11:10 AM 11/27/2023    8:31 AM  Depression screen PHQ 2/9  Decreased Interest 0 0 0  Down, Depressed, Hopeless 0 0 0  PHQ - 2 Score 0 0 0  Altered sleeping 0 3   Tired, decreased energy 0 3   Change in appetite 0 0   Feeling bad or failure about yourself  0 0   Trouble concentrating 0 0   Moving slowly or fidgety/restless 0 0   Suicidal thoughts 0 0   PHQ-9 Score 0 6   Difficult doing work/chores Not difficult at all Not difficult at all        08/30/2024    9:45 AM 01/07/2024   11:10 AM 04/16/2023    3:19 PM  GAD 7 : Generalized Anxiety Score  Nervous, Anxious, on Edge 0 0 3  Control/stop worrying 0 0 3  Worry too much - different things 0 0 3  Trouble relaxing 0 0 3  Restless 0 0 1  Easily annoyed or irritable 0 0 3  Afraid - awful might happen 0 0 3  Total GAD 7 Score 0 0 19  Anxiety Difficulty Not difficult at all Not difficult at all Somewhat difficult      Review of Systems  Constitutional:  Negative for chills and  fever.  Respiratory:  Negative for shortness of breath.   Cardiovascular:  Negative for chest pain.  Gastrointestinal:  Positive for abdominal pain and nausea. Negative for constipation, diarrhea, heartburn and vomiting.  Genitourinary:  Negative for dysuria, frequency and urgency.  Neurological:  Negative for dizziness and headaches.  Endo/Heme/Allergies:  Negative for polydipsia.  Psychiatric/Behavioral:  Negative for depression and suicidal ideas. The patient is not nervous/anxious.       Objective:     BP 120/82   Pulse 97   Temp 98.3 F (36.8 C) (Oral)   Ht 5' 4 (1.626 m)   Wt 132 lb 6.4 oz (60.1 kg)   SpO2 99%   BMI 22.73 kg/m  BP Readings from Last 3 Encounters:  08/30/24 120/82  01/15/24 110/72  01/09/24 113/63   Wt Readings from Last 3 Encounters:  08/30/24 132 lb 6.4  oz (60.1 kg)  01/15/24 152 lb (68.9 kg)  01/07/24 151 lb (68.5 kg)      Physical Exam Vitals and nursing note reviewed.  Constitutional:      Appearance: Normal appearance.  Cardiovascular:     Rate and Rhythm: Normal rate and regular rhythm.     Pulses: Normal pulses.     Heart sounds: Normal heart sounds.  Pulmonary:     Effort: Pulmonary effort is normal.     Breath sounds: Normal breath sounds.  Abdominal:     General: Abdomen is flat. Bowel sounds are normal. There is no distension.     Palpations: Abdomen is soft.     Tenderness: There is no abdominal tenderness. There is no guarding.  Neurological:     Mental Status: She is alert and oriented to person, place, and time.  Psychiatric:        Mood and Affect: Mood normal.        Behavior: Behavior normal.        Thought Content: Thought content normal.        Judgment: Judgment normal.      Results for orders placed or performed in visit on 08/30/24  POCT urine pregnancy  Result Value Ref Range   Preg Test, Ur Negative Negative       The ASCVD Risk score (Arnett DK, et al., 2019) failed to calculate for the following  reasons:   The 2019 ASCVD risk score is only valid for ages 18 to 64    Assessment & Plan:  Abdominal pain, RUQ -     CBC With Differential/Platelet -     Lipase -     Amylase -     Comprehensive metabolic panel with GFR -     US  ABDOMEN LIMITED RUQ (LIVER/GB); Future -     NM Hepato W/EF; Future  Nausea -     POCT urine pregnancy    Assessment and Plan Assessment & Plan Right upper quadrant and epigastric pain, possible biliary or acid-related etiology Intermittent pain in the right upper quadrant and epigastric area, radiating to the back, with nausea. Differential includes gallbladder dysfunction and acid reflux. Pain improved with antacids and aspirin.  - Poc urine test negative.  - Start Famotidine  20 mg once daily.  - RUQ US  and HIDA scan ordered. - Perform urine pregnancy test before imaging. - CBC with diff, CMP, lipase and amylase pending. - Follow up in two weeks, may cancel if better.   Return in about 2 weeks (around 09/13/2024) for abdominal pain (may cancel if better).    Carrol Aurora, NP

## 2024-08-30 NOTE — Telephone Encounter (Signed)
 FYI Only or Action Required?: FYI only for provider.  Patient was last seen in primary care on 01/15/2024 by Gretta Comer POUR, NP.  Called Nurse Triage reporting Abdominal Pain.  Symptoms began today.  Interventions attempted: Nothing.  Symptoms are: unchanged.  Triage Disposition: See Physician Within 24 Hours  Patient/caregiver understands and will follow disposition?: Yes     Copied from CRM #[id number]. Topic: Clinical - Red Word Triage >> Aug 30, 2024  8:33 AM Viola F wrote: Patient having upper right abdomen pain Reason for Disposition  [1] MODERATE pain (e.g., interferes with normal activities) AND [2] pain comes and goes (cramps) AND [3] present > 24 hours  (Exception: Pain with Vomiting or Diarrhea - see that Guideline.)  Answer Assessment - Initial Assessment Questions 1. LOCATION: Where does it hurt?      Right upper side 2. RADIATION: Does the pain shoot anywhere else? (e.g., chest, back)     Towards chest and back 3. ONSET: When did the pain begin? (e.g., minutes, hours or days ago)      Today at 2 am 4. SUDDEN: Gradual or sudden onset?     Gradually but was sleeping could have been suddenly 5. PATTERN Does the pain come and go, or is it constant?     Comes and goes 6. SEVERITY: How bad is the pain?  (e.g., Scale 1-10; mild, moderate, or severe)     Mild to moderate 7. RECURRENT SYMPTOM: Have you ever had this type of stomach pain before? If Yes, ask: When was the last time? and What happened that time?      denies 8. CAUSE: What do you think is causing the stomach pain? (e.g., gallstones, recent abdominal surgery)     Gallbladder issues  9. RELIEVING/AGGRAVATING FACTORS: What makes it better or worse? (e.g., antacids, bending or twisting motion, bowel movement)     denies 10. OTHER SYMPTOMS: Do you have any other symptoms? (e.g., back pain, diarrhea, fever, urination pain, vomiting)       Back pain,  11. PREGNANCY: Is there any  chance you are pregnant? When was your last menstrual period?       na  Protocols used: Abdominal Pain - Female-A-AH

## 2024-08-31 ENCOUNTER — Telehealth: Payer: Self-pay

## 2024-08-31 LAB — CBC WITH DIFFERENTIAL/PLATELET
Absolute Lymphocytes: 2803 {cells}/uL (ref 850–3900)
Absolute Monocytes: 542 {cells}/uL (ref 200–950)
Basophils Absolute: 76 {cells}/uL (ref 0–200)
Basophils Relative: 0.8 %
Eosinophils Absolute: 238 {cells}/uL (ref 15–500)
Eosinophils Relative: 2.5 %
HCT: 42.5 % (ref 35.0–45.0)
Hemoglobin: 13.5 g/dL (ref 11.7–15.5)
MCH: 29.9 pg (ref 27.0–33.0)
MCHC: 31.8 g/dL — ABNORMAL LOW (ref 32.0–36.0)
MCV: 94 fL (ref 80.0–100.0)
MPV: 9.2 fL (ref 7.5–12.5)
Monocytes Relative: 5.7 %
Neutro Abs: 5843 {cells}/uL (ref 1500–7800)
Neutrophils Relative %: 61.5 %
Platelets: 425 Thousand/uL — ABNORMAL HIGH (ref 140–400)
RBC: 4.52 Million/uL (ref 3.80–5.10)
RDW: 12.2 % (ref 11.0–15.0)
Total Lymphocyte: 29.5 %
WBC: 9.5 Thousand/uL (ref 3.8–10.8)

## 2024-08-31 LAB — TIQ-MISC

## 2024-08-31 NOTE — Telephone Encounter (Signed)
 Copied from CRM [id number]. Topic: Clinical - Lab/Test Results >> Aug 31, 2024 10:50 AM [place] A wrote: Reason for CRM: [facility] labs CBC diff plalate reflux requires serum sample- Please call customer service at  [phone] reference number [id number] E

## 2024-08-31 NOTE — Telephone Encounter (Signed)
 Called [facility] let them know we do not need the serum portion to disregard that and we can see the cbc results.

## 2024-09-06 ENCOUNTER — Telehealth: Payer: Self-pay

## 2024-09-06 NOTE — Telephone Encounter (Unsigned)
 Copied from CRM [id number]. Topic: General - Other >> Sep 06, 2024 12:01 PM Aisha D wrote: Reason for CRM: Pt stated that she has an appt tomorrow for an ultrasound and they stated that the cpt code used may be incorrect and needs the correct code. Pt would like for the code to be corrected and would like a callback with an update.

## 2024-09-07 ENCOUNTER — Ambulatory Visit
Admission: RE | Admit: 2024-09-07 | Discharge: 2024-09-07 | Disposition: A | Discharge status: Home / Self Care | Source: Ambulatory Visit | Attending: General Practice | Admitting: General Practice

## 2024-09-07 DIAGNOSIS — R1011 Right upper quadrant pain: Secondary | ICD-10-CM | POA: Insufficient documentation

## 2024-09-07 NOTE — Telephone Encounter (Signed)
 Referral specialist spoke with patients insurance and they stated her plan covers the US  at 80% once her deductible is met. And since it has not been met that is the reason for the $600 charge. Her deductible is $4,000 and she's only met roughly $600 of it. They said the same applies to her HIDA scan coming up as well.  Also advised patient that the CPT code would be coming from radiology not us  as we are not the ones billing her for scan radiology is. Patient is going to call radiology again to see if code can be changed.

## 2024-09-13 ENCOUNTER — Encounter (HOSPITAL_BASED_OUTPATIENT_CLINIC_OR_DEPARTMENT_OTHER): Payer: Self-pay

## 2024-09-13 ENCOUNTER — Ambulatory Visit

## 2024-09-13 ENCOUNTER — Emergency Department (HOSPITAL_BASED_OUTPATIENT_CLINIC_OR_DEPARTMENT_OTHER)

## 2024-09-13 ENCOUNTER — Other Ambulatory Visit: Payer: Self-pay

## 2024-09-13 ENCOUNTER — Emergency Department (HOSPITAL_BASED_OUTPATIENT_CLINIC_OR_DEPARTMENT_OTHER)
Admission: EM | Admit: 2024-09-13 | Discharge: 2024-09-13 | Disposition: A | Discharge status: Home / Self Care | Attending: Emergency Medicine | Admitting: Emergency Medicine

## 2024-09-13 DIAGNOSIS — K8050 Calculus of bile duct without cholangitis or cholecystitis without obstruction: Secondary | ICD-10-CM | POA: Insufficient documentation

## 2024-09-13 DIAGNOSIS — R109 Unspecified abdominal pain: Secondary | ICD-10-CM | POA: Diagnosis present

## 2024-09-13 LAB — URINALYSIS, ROUTINE W REFLEX MICROSCOPIC
Bacteria, UA: NONE SEEN
Bilirubin Urine: NEGATIVE
Glucose, UA: NEGATIVE mg/dL
Hgb urine dipstick: NEGATIVE
Ketones, ur: NEGATIVE mg/dL
Nitrite: NEGATIVE
Protein, ur: NEGATIVE mg/dL
Specific Gravity, Urine: 1.012 (ref 1.005–1.030)
pH: 6.5 (ref 5.0–8.0)

## 2024-09-13 LAB — COMPREHENSIVE METABOLIC PANEL WITH GFR
ALT: 14 U/L (ref 0–44)
AST: 20 U/L (ref 15–41)
Albumin: 4.1 g/dL (ref 3.5–5.0)
Alkaline Phosphatase: 85 U/L (ref 38–126)
Anion gap: 14 (ref 5–15)
BUN: 11 mg/dL (ref 6–20)
CO2: 24 mmol/L (ref 22–32)
Calcium: 9.2 mg/dL (ref 8.9–10.3)
Chloride: 102 mmol/L (ref 98–111)
Creatinine, Ser: 0.71 mg/dL (ref 0.44–1.00)
GFR, Estimated: >60 mL/min (ref >60)
Glucose, Bld: 108 mg/dL — ABNORMAL HIGH (ref 70–99)
Potassium: 3.4 mmol/L — ABNORMAL LOW (ref 3.5–5.1)
Sodium: 140 mmol/L (ref 135–145)
Total Bilirubin: 0.4 mg/dL (ref 0.0–1.2)
Total Protein: 7 g/dL (ref 6.5–8.1)

## 2024-09-13 LAB — CBC
HCT: 38.9 % (ref 36.0–46.0)
Hemoglobin: 13 g/dL (ref 12.0–15.0)
MCH: 30 pg (ref 26.0–34.0)
MCHC: 33.4 g/dL (ref 30.0–36.0)
MCV: 89.8 fL (ref 80.0–100.0)
Platelets: 345 K/uL (ref 150–400)
RBC: 4.33 MIL/uL (ref 3.87–5.11)
RDW: 12.4 % (ref 11.5–15.5)
WBC: 13 K/uL — ABNORMAL HIGH (ref 4.0–10.5)
nRBC: 0 % (ref 0.0–0.2)

## 2024-09-13 LAB — TROPONIN T, HIGH SENSITIVITY
Troponin T High Sensitivity: <15 ng/L (ref 0–19)
Troponin T High Sensitivity: <15 ng/L (ref 0–19)

## 2024-09-13 LAB — PREGNANCY, URINE: Preg Test, Ur: NEGATIVE

## 2024-09-13 LAB — LIPASE, BLOOD: Lipase: 45 U/L (ref 11–51)

## 2024-09-13 MED ORDER — OXYCODONE-ACETAMINOPHEN 5-325 MG PO TABS: 1.0000 | ORAL_TABLET | Freq: Four times a day (QID) | ORAL | 0 refills | Status: DC | PRN

## 2024-09-13 MED ORDER — SODIUM CHLORIDE 0.9 % IV BOLUS
1000.0000 mL | Freq: Once | INTRAVENOUS | Status: AC
  Administered 2024-09-13: 1000 mL via INTRAVENOUS

## 2024-09-13 MED ORDER — ONDANSETRON HCL 4 MG/2ML IJ SOLN
4.0000 mg | Freq: Once | INTRAMUSCULAR | Status: AC
  Administered 2024-09-13: 4 mg via INTRAVENOUS
  Filled 2024-09-13: qty 2

## 2024-09-13 MED ORDER — ONDANSETRON 4 MG PO TBDP: 4.0000 mg | ORAL_TABLET | Freq: Three times a day (TID) | ORAL | 0 refills | Status: DC | PRN

## 2024-09-13 MED ORDER — ONDANSETRON 4 MG PO TBDP
4.0000 mg | ORAL_TABLET | Freq: Once | ORAL | Status: AC | PRN
  Administered 2024-09-13: 4 mg via ORAL
  Filled 2024-09-13: qty 1

## 2024-09-13 MED ORDER — MORPHINE SULFATE (PF) 4 MG/ML IV SOLN
4.0000 mg | Freq: Once | INTRAVENOUS | Status: AC
  Administered 2024-09-13: 4 mg via INTRAVENOUS
  Filled 2024-09-13: qty 1

## 2024-09-13 NOTE — ED Notes (Signed)
 Patient finished a cup of water without N/V/ discomfort.

## 2024-09-13 NOTE — ED Triage Notes (Signed)
 Pt c/o really really bad pain starting upper stomach into central chest. Associated nausea. Onset approx midnight, constant pain, kind of stabbing.  Hx gallstones, no plan for surgical intervention.

## 2024-09-13 NOTE — ED Provider Notes (Signed)
 [place] AT [facility] Provider Note   CSN: [id number] Arrival date & time: 09/13/24  [id number]     Patient presents with: No chief complaint on file.   Mylasia P Scotti is a 38 y.o. female.   HPI     This is a 38 year old female who presents with abdominal pain.  Onset of upper abdominal pain that radiated into the chest and back earlier tonight.  She had a similar episode several weeks ago and found out she had gallstones.  She had an outpatient ultrasound and states that she is supposed to have a HIDA scan in late October.  No fevers.  Reports nausea and 1 episode of nonbilious, nonbloody emesis.  Prior to Admission medications   Medication Sig Start Date End Date Taking? Authorizing Provider  ondansetron  (ZOFRAN -ODT) 4 MG disintegrating tablet Take 1 tablet (4 mg total) by mouth every 8 (eight) hours as needed. 09/13/24  Yes Shalane Florendo, Charmaine FALCON, MD  oxyCODONE -acetaminophen  (PERCOCET/ROXICET) 5-325 MG tablet Take 1 tablet by mouth every 6 (six) hours as needed for severe pain (pain score 7-10). 09/13/24  Yes Crescentia Boutwell, Charmaine FALCON, MD  cyclobenzaprine  (FLEXERIL ) 5 MG tablet Take 5-10 mg by mouth at bedtime.    [provider]  FLUoxetine  (PROZAC ) 40 MG capsule Take 1 capsule (40 mg total) by mouth daily. 04/26/24   Barbra Jayson LABOR, MD  NIKKI 3-0.02 MG tablet Take 1 tablet by mouth daily. 11/18/22   [provider]  Prenatal Vit-Fe Fumarate-FA (PRENATAL MULTIVITAMIN) TABS tablet Take 1 tablet by mouth daily at 12 noon. 12/12/21   Tomblin, James, MD  tretinoin  (RETIN-A ) 0.05 % cream Apply a pea-sized amount to face at bedtime, wash off in morning. 07/12/24   Hester Alm BROCKS, MD    Allergies: Amoxicillin, Penicillins, and Sulfa antibiotics    Review of Systems  Constitutional:  Negative for fever.  Respiratory:  Negative for shortness of breath.   Cardiovascular:  Negative for chest pain.  Gastrointestinal:  Positive for abdominal pain,  nausea and vomiting.  All other systems reviewed and are negative.   Updated Vital Signs BP 105/69   Pulse 80   Temp 98.2 F (36.8 C) (Oral)   Resp 18   SpO2 100%   Physical Exam Vitals and nursing note reviewed.  Constitutional:      Appearance: She is well-developed.  HENT:     Head: Normocephalic and atraumatic.  Eyes:     Pupils: Pupils are equal, round, and reactive to light.  Cardiovascular:     Rate and Rhythm: Normal rate and regular rhythm.     Heart sounds: Normal heart sounds.  Pulmonary:     Effort: Pulmonary effort is normal. No respiratory distress.     Breath sounds: No wheezing.  Abdominal:     General: Bowel sounds are normal.     Palpations: Abdomen is soft.     Tenderness: There is abdominal tenderness.     Comments: Epigastric tenderness to palpation, no rebound or guarding  Musculoskeletal:     Cervical back: Neck supple.  Skin:    General: Skin is warm and dry.  Neurological:     Mental Status: She is alert and oriented to person, place, and time.  Psychiatric:        Mood and Affect: Mood normal.     (all labs ordered are listed, but only abnormal results are displayed) Labs Reviewed  COMPREHENSIVE METABOLIC PANEL WITH GFR - Abnormal; Notable for the following components:  Result Value   Potassium 3.4 (*)    Glucose, Bld 108 (*)    All other components within normal limits  CBC - Abnormal; Notable for the following components:   WBC 13.0 (*)    All other components within normal limits  URINALYSIS, ROUTINE W REFLEX MICROSCOPIC - Abnormal; Notable for the following components:   Leukocytes,Ua TRACE (*)    All other components within normal limits  LIPASE, BLOOD  PREGNANCY, URINE  TROPONIN T, HIGH SENSITIVITY  TROPONIN T, HIGH SENSITIVITY    EKG: EKG Interpretation Date/Time:  Monday September 13 2024 01:29:38 EDT Ventricular Rate:  71 PR Interval:  129 QRS Duration:  112 QT Interval:  561 QTC Calculation: 610 R  Axis:   92  Text Interpretation: Sinus rhythm Incomplete right bundle branch block Probable left ventricular hypertrophy Prolonged QT interval Confirmed by Bari Pfeiffer ([id number]) on 09/13/2024 2:07:55 AM  Radiology: US  Abdomen Limited RUQ (LIVER/GB) Result Date: 09/13/2024 CLINICAL DATA:  Right upper quadrant pain. EXAM: ULTRASOUND ABDOMEN LIMITED RIGHT UPPER QUADRANT COMPARISON:  09/07/2024 FINDINGS: Gallbladder: Multiple gallstones evident measuring up to 6 mm. No gallbladder wall thickening or pericholecystic fluid. The sonographer reports no sonographic Murphy sign. Common bile duct: Diameter: 6 mm, upper normal. Liver: No focal lesion identified. Within normal limits in parenchymal echogenicity. Portal vein is patent on color Doppler imaging with normal direction of blood flow towards the liver. Other: None. IMPRESSION: Cholelithiasis without gallbladder wall thickening or pericholecystic fluid. No biliary dilatation. Electronically Signed   By: Camellia Candle M.D.   On: 09/13/2024 05:53     Procedures   Medications Ordered in the ED  ondansetron  (ZOFRAN -ODT) disintegrating tablet 4 mg (4 mg Oral Given 09/13/24 0137)  morphine (PF) 4 MG/ML injection 4 mg (4 mg Intravenous Given 09/13/24 0232)  ondansetron  (ZOFRAN ) injection 4 mg (4 mg Intravenous Given 09/13/24 0232)  sodium chloride  0.9 % bolus 1,000 mL (0 mLs Intravenous Stopped 09/13/24 0414)                                    Medical Decision Making Amount and/or Complexity of Data Reviewed Labs: ordered. Radiology: ordered.  Risk Prescription drug management.   This patient presents to the ED for concern of abdominal pain, this involves an extensive number of treatment options, and is a complaint that carries with it a high risk of complications and morbidity.  I considered the following differential and admission for this acute, potentially life threatening condition.  The differential diagnosis includes gastritis, pancreatitis,  cholecystitis, less likely appendicitis or obstructive pathology  MDM:    This is a 38 year old female who presents with abdominal pain.  Recent history of recurrent abdominal pain.  Found to have gallstones on ultrasound.  Is awaiting a HIDA scan.  She is nontoxic and vital signs are reassuring.  Epigastric and right upper quadrant tenderness to palpation.  History is certainly suggestive of likely gallbladder pathology.  Labs obtained.  Normal lipase.  Normal LFTs.  Slight leukocytosis to 13.  Ultrasound is stable with gallstones.  No evidence of acute inflammation, gallbladder wall thickening, or pericholecystic fluid.  On recheck, patient states she feels better and is able to tolerate fluids.  Discussed with her that I felt like she likely did not need a HIDA scan but just general surgery consultation for elective cholecystectomy.  We discussed supportive measures in the meantime and pain control.  If symptoms were  to recur or persist, she would need more emergent evaluation for cholecystectomy.  Patient understands and was given strict return precautions.  (Labs, imaging, consults)  Labs: I Ordered, and personally interpreted labs.  The pertinent results include: CBC, CMP, lipase, urinalysis  Imaging Studies ordered: I ordered imaging studies including ultrasound I independently visualized and interpreted imaging. I agree with the radiologist interpretation  Additional history obtained from chart review.  External records from outside source obtained and reviewed including prior evaluations  Cardiac Monitoring: The patient was maintained on a cardiac monitor.  If on the cardiac monitor, I personally viewed and interpreted the cardiac monitored which showed an underlying rhythm of: Sinus  Reevaluation: After the interventions noted above, I reevaluated the patient and found that they have :improved  Social Determinants of Health:  lives independently  Disposition: Discharge with  general surgery follow-up  Co morbidities that complicate the patient evaluation  Past Medical History:  Diagnosis Date   Adjustment reaction with anxiety and depression 12/26/2022   Anemia    Anxiety    Postpartum   Chronic pelvic pain in female    Constipation 01/10/2015   Depression    Postpartum   H/O chronic cystitis    History of recurrent UTIs    History of urethral stricture    Nephrolithiasis    bilateral non-obstructive per urologist note 06-16-2017  -- dr cam   Normal labor 11/10/2018   On stimulant medication 10/21/2023   Pregnancy 12/11/2021   Retained placenta 11/11/2018   Rib pain 12/26/2022     Medicines Meds ordered this encounter  Medications   ondansetron  (ZOFRAN -ODT) disintegrating tablet 4 mg   morphine (PF) 4 MG/ML injection 4 mg   ondansetron  (ZOFRAN ) injection 4 mg   sodium chloride  0.9 % bolus 1,000 mL   oxyCODONE -acetaminophen  (PERCOCET/ROXICET) 5-325 MG tablet    Sig: Take 1 tablet by mouth every 6 (six) hours as needed for severe pain (pain score 7-10).    Dispense:  10 tablet    Refill:  0   ondansetron  (ZOFRAN -ODT) 4 MG disintegrating tablet    Sig: Take 1 tablet (4 mg total) by mouth every 8 (eight) hours as needed.    Dispense:  20 tablet    Refill:  0    I have reviewed the patients home medicines and have made adjustments as needed  Problem List / ED Course: Problem List Items Addressed This Visit   None Visit Diagnoses       Biliary colic    -  Primary                Final diagnoses:  Biliary colic    ED Discharge Orders          Ordered    oxyCODONE -acetaminophen  (PERCOCET/ROXICET) 5-325 MG tablet  Every 6 hours PRN        09/13/24 0606    ondansetron  (ZOFRAN -ODT) 4 MG disintegrating tablet  Every 8 hours PRN        09/13/24 0606               Bari Charmaine FALCON, MD 09/13/24 [phone]

## 2024-09-13 NOTE — ED Notes (Signed)
 Pt. vomited times one in triage. Will medicate for nausea.

## 2024-09-13 NOTE — Discharge Instructions (Addendum)
 You were seen today for abdominal pain.  Ultrasound confirms gallstones.  Right now your gallbladder does not appear inflamed but given recent recurrent episodes of pain, you likely will need your gallbladder removed.  Follow-up with general surgery.  Call office for next available appointment.  In the meantime take medications as prescribed.  If you have return or new or worsening symptoms, you should be reevaluated.

## 2024-09-16 DIAGNOSIS — F4323 Adjustment disorder with mixed anxiety and depressed mood: Secondary | ICD-10-CM

## 2024-09-28 ENCOUNTER — Encounter: Payer: Self-pay | Admitting: Radiology

## 2024-09-30 ENCOUNTER — Encounter: Payer: Self-pay | Admitting: Dermatology

## 2024-10-07 ENCOUNTER — Other Ambulatory Visit

## 2024-10-12 ENCOUNTER — Ambulatory Visit (INDEPENDENT_AMBULATORY_CARE_PROVIDER_SITE_OTHER): Admitting: Dermatology

## 2024-10-12 DIAGNOSIS — L219 Seborrheic dermatitis, unspecified: Secondary | ICD-10-CM | POA: Diagnosis not present

## 2024-10-12 DIAGNOSIS — L710 Perioral dermatitis: Secondary | ICD-10-CM

## 2024-10-12 DIAGNOSIS — Z7189 Other specified counseling: Secondary | ICD-10-CM

## 2024-10-12 DIAGNOSIS — Z79899 Other long term (current) drug therapy: Secondary | ICD-10-CM

## 2024-10-12 MED ORDER — SULFACETAMIDE SODIUM-SULFUR 10-5 % EX CREA: TOPICAL_CREAM | CUTANEOUS | 11 refills | Status: DC

## 2024-10-12 MED ORDER — DOXYCYCLINE MONOHYDRATE 50 MG PO CAPS: ORAL_CAPSULE | ORAL | 4 refills | Status: DC

## 2024-10-12 NOTE — Patient Instructions (Addendum)
 For rash around mouth   Start avar cream - apply topically to affected areas daily   Start doxycycline 50  mg capsule take 1 by mouth with evening meal   Doxycycline should be taken with food to prevent nausea. Do not lay down for 30 minutes after taking. Be cautious with sun exposure and use good sun protection while on this medication. Pregnant women should not take this medication.    Due to recent changes in healthcare laws, you may see results of your pathology and/or laboratory studies on MyChart before the doctors have had a chance to review them. We understand that in some cases there may be results that are confusing or concerning to you. Please understand that not all results are received at the same time and often the doctors may need to interpret multiple results in order to provide you with the best plan of care or course of treatment. Therefore, we ask that you please give us  2 business days to thoroughly review all your results before contacting the office for clarification. Should we see a critical lab result, you will be contacted sooner.   If You Need Anything After Your Visit  If you have any questions or concerns for your doctor, please call our main line at [phone] and press option 4 to reach your doctor's medical assistant. If no one answers, please leave a voicemail as directed and we will return your call as soon as possible. Messages left after 4 pm will be answered the following business day.   You may also send us  a message via MyChart. We typically respond to MyChart messages within 1-2 business days.  For prescription refills, please ask your pharmacy to contact our office. Our fax number is [phone].  If you have an urgent issue when the clinic is closed that cannot wait until the next business day, you can page your doctor at the number below.    Please note that while we do our best to be available for urgent issues outside of office hours, we are not  available 24/7.   If you have an urgent issue and are unable to reach us , you may choose to seek medical care at your doctor's office, retail clinic, urgent care center, or emergency room.  If you have a medical emergency, please immediately call 911 or go to the emergency department.  Pager Numbers  - Dr. Hester: [phone]  - Dr. Jackquline: [phone]  - Dr. Claudene: [phone]   - Dr. Raymund: [phone]  In the event of inclement weather, please call our main line at [phone] for an update on the status of any delays or closures.  Dermatology Medication Tips: Please keep the boxes that topical medications come in in order to help keep track of the instructions about where and how to use these. Pharmacies typically print the medication instructions only on the boxes and not directly on the medication tubes.   If your medication is too expensive, please contact our office at [phone] option 4 or send us  a message through MyChart.   We are unable to tell what your co-pay for medications will be in advance as this is different depending on your insurance coverage. However, we may be able to find a substitute medication at lower cost or fill out paperwork to get insurance to cover a needed medication.   If a prior authorization is required to get your medication covered by your insurance company, please allow us  1-2 business days to complete this  process.  Drug prices often vary depending on where the prescription is filled and some pharmacies may offer cheaper prices.  The website www.goodrx.com contains coupons for medications through different pharmacies. The prices here do not account for what the cost may be with help from insurance (it may be cheaper with your insurance), but the website can give you the price if you did not use any insurance.  - You can print the associated coupon and take it with your prescription to the pharmacy.  - You may also stop by our office  during regular business hours and pick up a GoodRx coupon card.  - If you need your prescription sent electronically to a different pharmacy, notify our office through [facility] or by phone at [phone] option 4.     Si Usted Necesita Algo Despus de Su Visita  Tambin puede enviarnos un mensaje a travs de Clinical Cytogeneticist. Por lo general respondemos a los mensajes de MyChart en el transcurso de 1 a 2 das hbiles.  Para renovar recetas, por favor pida a su farmacia que se ponga en contacto con nuestra oficina. Randi lakes de fax es [place] [phone].  Si tiene un asunto urgente cuando la clnica est cerrada y que no puede esperar hasta el siguiente da hbil, puede llamar/localizar a su doctor(a) al nmero que aparece a continuacin.   Por favor, tenga en cuenta que aunque hacemos todo lo posible para estar disponibles para asuntos urgentes fuera del horario de [place], no estamos disponibles las 24 horas del da, los 7 [address]  [address] de la [place].   Si tiene un problema urgente y no puede comunicarse con nosotros, puede optar por buscar atencin mdica  en el consultorio de su doctor(a), en una clnica privada, en un centro de atencin urgente o en una sala de emergencias.  Si tiene engineer, drilling, por favor llame inmediatamente al 911 o vaya a la sala de emergencias.  Nmeros de bper  - Dr. Hester: [phone]  - Dra. Jackquline: [phone]  - Dr. Claudene: [phone]  - Dra. Kitts: [phone]  En caso de inclemencias del [place], por favor llame a nuestra lnea principal al [phone] para una actualizacin sobre el estado de cualquier retraso o cierre.  Consejos para la medicacin en dermatologa: Por favor, guarde las cajas en las que vienen los medicamentos de uso tpico para ayudarle a seguir las instrucciones sobre dnde y cmo usarlos. Las farmacias generalmente imprimen las instrucciones del medicamento slo en las cajas y no directamente en los tubos del  [place].   Si su medicamento es muy caro, por favor, pngase en contacto con landry rieger llamando al [phone] y presione la opcin 4 o envenos un mensaje a travs de Clinical Cytogeneticist.   No podemos decirle cul ser su copago por los medicamentos por adelantado ya que esto es diferente dependiendo de la cobertura de su seguro. Sin embargo, es posible que podamos encontrar un medicamento sustituto a audiological scientist un formulario para que el seguro cubra el medicamento que se considera necesario.   Si se requiere una autorizacin previa para que su compaa de seguros cubra su medicamento, por favor permtanos de 1 a 2 das hbiles para completar este proceso.  Los precios de los medicamentos varan con frecuencia dependiendo del environmental consultant de dnde se surte la receta y alguna farmacias pueden ofrecer precios ms baratos.  El sitio web www.goodrx.com tiene cupones para medicamentos de health and safety inspector. Los precios aqu no tienen en cuenta lo que podra costar con  la ayuda del seguro (puede ser ms barato con su seguro), pero el sitio web puede darle el precio si no visual merchandiser.  - Puede imprimir el cupn correspondiente y llevarlo con su receta a la farmacia.  - Tambin puede pasar por nuestra oficina durante el horario de atencin regular y education officer, museum una tarjeta de cupones de GoodRx.  - Si necesita que su receta se enve electrnicamente a una farmacia diferente, informe a nuestra oficina a travs de MyChart de [place] o por telfono llamando al [phone] y presione la opcin 4.

## 2024-10-12 NOTE — Progress Notes (Unsigned)
   Follow-Up Visit   Subjective  Stacey Houston is a 38 y.o. female who presents for the following: patient is here concering rash at mouth dealing with for about 6 weeks.  The following portions of the chart were reviewed this encounter and updated as appropriate: medications, allergies, medical history  Review of Systems:  No other skin or systemic complaints except as noted in HPI or Assessment and Plan.  Objective  Well appearing patient in no apparent distress; mood and affect are within normal limits.   A focused examination was performed of the following areas: face  Relevant exam findings are noted in the Assessment and Plan. Perioral dermatitis photos  Perioral dermatitis photos  Perioral dermatitis photos     Assessment & Plan   PERIORAL DERMATITIS Exam: clustered small pink scaly papules perioral/perinasal area see photos Chronic and persistent condition with duration or expected duration over one year. Condition is symptomatic/ bothersome to patient. Not currently at goal. Perioral dermatitis is an eruption which is usually located around the mouth and nose.  It can be a rash and/or red bumps.  It occasionally occurs around the eyes.  It may be itchy and may burn.  The exact cause is unknown.  Some types of makeup, moisturizers, dental products, and prescription creams may be partially responsible for the eruption.  Topical steroids such as cortisone creams can temporarily make the rash better but with discontinuation the rash tends to recur and worsen, so they should be avoided. Topical antibiotics, elidel cream, protopic ointment, and oral antibiotics may be prescribed to treat this condition.  Although perioral dermatitis is not an infection, some antibiotics have anti-inflammatory properties that help it greatly.   Treatment Plan: Start Avar Cream - apply topically to affected areas qd  Start Doxycycline Mono 50 mg capsule - take 1 cap po qd with evening  meal  Discussed if planning to get pregnant discontinue medications  Doxycycline should be taken with food to prevent nausea. Do not lay down for 30 minutes after taking. Be cautious with sun exposure and use good sun protection while on this medication. Pregnant women should not take this medication.    Seborrheic dermatitis Face; mostly Right Nasolabial area Seborrheic Dermatitis  Chronic condition with duration or expected duration over one year. Currently well-controlled. -  is a chronic persistent rash characterized by pinkness and scaling most commonly of the mid face but also can occur on the scalp (dandruff), ears; mid chest, mid back and groin.  It tends to be exacerbated by stress and cooler weather.  People who have neurologic disease may experience new onset or exacerbation of existing seborrheic dermatitis.  The condition is not curable but treatable and can be controlled.    Continue Hydrocortisone  cream apply to face Mon/Wed/Fri Continue  Ketoconazole  cream apply to face Tues/thurs/Sat    PERIORAL DERMATITIS   Related Medications Sulfacetamide Sodium-Sulfur (AVAR-E EMOLLIENT) 10-5 % CREA Apply cream topically daily to affected area for perioral dermatitis doxycycline (MONODOX) 50 MG capsule Take 1 po qd with evening meal and drink with perioral dermatitis  Return in about 1 year (around 10/12/2025) for perioral dermatitis followup.  IEleanor Blush, CMA, am acting as scribe for Alm Rhyme, MD.   Documentation: I have reviewed the above documentation for accuracy and completeness, and I agree with the above.  Alm Rhyme, MD

## 2024-10-13 ENCOUNTER — Encounter: Payer: Self-pay | Admitting: Dermatology

## 2024-10-18 ENCOUNTER — Ambulatory Visit: Admitting: Dermatology

## 2024-10-20 MED ORDER — FLUOXETINE HCL 10 MG PO CAPS: ORAL_CAPSULE | ORAL | 0 refills | Status: DC

## 2024-10-25 ENCOUNTER — Other Ambulatory Visit: Payer: Self-pay | Admitting: General Surgery

## 2024-10-27 LAB — SURGICAL PATHOLOGY

## 2024-10-28 ENCOUNTER — Telehealth: Payer: Self-pay

## 2024-10-28 DIAGNOSIS — L710 Perioral dermatitis: Secondary | ICD-10-CM

## 2024-10-28 MED ORDER — SAFETY SEAL MISCELLANEOUS MISC: 1 refills | Status: DC

## 2024-10-28 NOTE — Telephone Encounter (Signed)
 I spoke to patient and she states that face is improving with doxycycline, but she would also like topical sent in. [facility] has sodium sulfacetamide/sulfur %8-%4 suspension so I sent that in and discussed with patient how to get medication.

## 2024-10-28 NOTE — Telephone Encounter (Signed)
 Avar cream not covered by insurance and very expensive. Please advise.

## 2024-10-28 NOTE — Addendum Note (Signed)
 Addended by: WILLMA KNEE A on: 10/28/2024 02:15 PM   Modules accepted: Orders

## 2024-11-11 ENCOUNTER — Other Ambulatory Visit: Payer: Self-pay | Admitting: Primary Care

## 2024-11-11 DIAGNOSIS — F4323 Adjustment disorder with mixed anxiety and depressed mood: Secondary | ICD-10-CM

## 2025-01-03 DIAGNOSIS — F4323 Adjustment disorder with mixed anxiety and depressed mood: Secondary | ICD-10-CM

## 2025-01-03 MED ORDER — FLUOXETINE HCL 20 MG PO CAPS: 20.0000 mg | ORAL_CAPSULE | Freq: Every day | ORAL | 0 refills | Status: DC

## 2025-01-12 ENCOUNTER — Ambulatory Visit: Admitting: Primary Care

## 2025-01-20 ENCOUNTER — Encounter: Payer: Self-pay | Admitting: Primary Care

## 2025-01-20 ENCOUNTER — Ambulatory Visit: Admitting: Primary Care

## 2025-01-20 VITALS — BP 112/70 | HR 103 | Temp 98.3°F | Ht 64.0 in | Wt 142.4 lb

## 2025-01-20 DIAGNOSIS — F4323 Adjustment disorder with mixed anxiety and depressed mood: Secondary | ICD-10-CM

## 2025-01-20 DIAGNOSIS — F422 Mixed obsessional thoughts and acts: Secondary | ICD-10-CM

## 2025-01-20 DIAGNOSIS — F411 Generalized anxiety disorder: Secondary | ICD-10-CM

## 2025-01-20 DIAGNOSIS — F330 Major depressive disorder, recurrent, mild: Secondary | ICD-10-CM

## 2025-01-20 MED ORDER — FLUOXETINE HCL 20 MG PO CAPS: 20.0000 mg | ORAL_CAPSULE | Freq: Every day | ORAL | 3 refills | Status: AC

## 2025-01-20 NOTE — Patient Instructions (Signed)
 Continue fluoxetine  20 mg once daily.  It was a pleasure to see you today!

## 2025-01-20 NOTE — Assessment & Plan Note (Signed)
 Controlled  Continue fluoxetine  20 mg daily Refills provide

## 2025-01-20 NOTE — Assessment & Plan Note (Signed)
 Controlled.  Continue fluoxetine 20 mg daily.

## 2025-01-20 NOTE — Assessment & Plan Note (Signed)
 Controlled.  Continue fluoxetine  20 mg daily. Refill provided today.

## 2025-01-20 NOTE — Progress Notes (Signed)
 "  Subjective:    Patient ID: Stacey Houston, female    DOB: 11-06-1986, 39 y.o.   MRN: [id number]  Stacey Houston is a very pleasant 39 y.o. female with a history of OCD, GAD, MDD, frequent headaches who presents today for follow-up of chronic conditions.  1) MDD/OCD/GAD: Previously followed with psychiatry and is managed on fluoxetine  20 mg daily. She was once managed up to 80 mg but has been able to wean down 20 mg. She felt no emotion on higher doses.   She is no longer following with psychiatry or therapy as she's feeling well. She denies SI/HI.   BP Readings from Last 3 Encounters:  01/20/25 112/70  09/13/24 105/69  08/30/24 120/82      Review of Systems  Respiratory:  Negative for shortness of breath.   Cardiovascular:  Negative for chest pain.  Gastrointestinal:  Negative for constipation and diarrhea.  Psychiatric/Behavioral:  The patient is not nervous/anxious.          Past Medical History:  Diagnosis Date   Abnormal thyroid  blood test 12/26/2022   Adjustment reaction with anxiety and depression 12/26/2022   Anemia    Anxiety    Postpartum   Chronic pelvic pain in female    Constipation 01/10/2015   Depression    Postpartum   H/O chronic cystitis    History of recurrent UTIs    History of strep pharyngitis 08/28/2023   History of urethral stricture    Nephrolithiasis    bilateral non-obstructive per urologist note 06-16-2017  -- dr cam   Normal labor 11/10/2018   On stimulant medication 10/21/2023   Pregnancy 12/11/2021   Retained placenta 11/11/2018   Rib pain 12/26/2022    Social History   Socioeconomic History   Marital status: Married    Spouse name: Not on file   Number of children: Not on file   Years of education: Not on file   Highest education level: Not on file  Occupational History   Not on file  Tobacco Use   Smoking status: Never   Smokeless tobacco: Never  Vaping Use   Vaping status: Never Used  Substance and  Sexual Activity   Alcohol use: Not Currently   Drug use: Never   Sexual activity: Yes    Birth control/protection: OCP  Other Topics Concern   Not on file  Social History Narrative   Not on file   Social Drivers of Health   Tobacco Use: Low Risk (01/20/2025)   Patient History    Smoking Tobacco Use: Never    Smokeless Tobacco Use: Never    Passive Exposure: Not on file  Financial Resource Strain: Not on file  Food Insecurity: Not on file  Transportation Needs: Not on file  Physical Activity: Not on file  Stress: Not on file  Social Connections: Not on file  Intimate Partner Violence: Not on file  Depression (PHQ2-9): Low Risk (01/20/2025)   Depression (PHQ2-9)    PHQ-2 Score: 1  Alcohol Screen: Not on file  Housing: Unknown (12/29/2023)   Received from [facility]   Epic    Unable to Pay for Housing in the Last Year: Not on file    Number of Times Moved in the Last Year: Not on file    At any time in the past 12 months, were you homeless or living in a shelter (including now)?: No  Utilities: Not on file  Health Literacy: Not on file  Past Surgical History:  Procedure Laterality Date   AUGMENTATION MAMMAPLASTY Bilateral 2009   CHROMOPERTUBATION N/A 08/12/2017   Procedure: CHROMOPERTUBATION;  Surgeon: Dannielle Bouchard, DO;  Location[facility];  Service: Gynecology;  Laterality: N/A;   CYSTO/  URETHRAL DILATION  11/09/2004   DILATION AND EVACUATION N/A 11/11/2018   Procedure: DILATATION AND EVACUATION;  Surgeon: Latisha Medford, MD;  Location: [facility];  Service: Gynecology;  Laterality: N/A;   LAPAROSCOPY N/A 08/12/2017   Procedure: LAPAROSCOPY DIAGNOSTIC [place] OF ENDOMETRIOSI;  Surgeon: Dannielle Bouchard, DO;  Location: [facility];  Service: Gynecology;  Laterality: N/A;   WISDOM TOOTH EXTRACTION  03/2016    Family History  Problem Relation Age of Onset   Diabetes Maternal Grandmother    Diabetes Maternal  Grandfather    Cancer Paternal Grandmother    Diabetes Paternal Grandmother    Cancer Paternal Grandfather    Diabetes Paternal Grandfather     Allergies[1]  Medications Ordered Prior to Encounter[2]  BP 112/70   Pulse (!) 103   Temp 98.3 F (36.8 C) (Oral)   Ht 5' 4 (1.626 m)   Wt 142 lb 6 oz (64.6 kg)   LMP  (Within Months)   SpO2 98%   BMI 24.44 kg/m  Objective:   Physical Exam Cardiovascular:     Rate and Rhythm: Normal rate and regular rhythm.  Pulmonary:     Effort: Pulmonary effort is normal.     Breath sounds: Normal breath sounds.  Musculoskeletal:     Cervical back: Neck supple.  Skin:    General: Skin is warm and dry.  Neurological:     Mental Status: She is alert and oriented to person, place, and time.  Psychiatric:        Mood and Affect: Mood normal.     Physical Exam        Assessment & Plan:  Generalized anxiety disorder Assessment & Plan: Controlled.  Continue fluoxetine  20 mg daily. Refill provided today.   Adjustment reaction with anxiety and depression -     FLUoxetine  HCl; Take 1 capsule (20 mg total) by mouth daily. For anxiety and depression  Dispense: 90 capsule; Refill: 3  Mild episode of recurrent major depressive disorder Assessment & Plan: Controlled.  Continue fluoxetine  20 mg daily   Mixed obsessional thoughts and acts Assessment & Plan: Controlled  Continue fluoxetine  20 mg daily Refills provide     Assessment and Plan Assessment & Plan         Comer MARLA Gaskins, NP       [1]  Allergies Allergen Reactions   Percocet [Oxycodone -Acetaminophen ] Itching   Amoxicillin Hives    Has patient had a PCN reaction causing immediate rash, facial/tongue/throat swelling, SOB or lightheadedness with hypotension: Yes Has patient had a PCN reaction causing severe rash involving mucus membranes or skin necrosis: Yes Has patient had a PCN reaction that required hospitalization: No Has patient had a PCN  reaction occurring within the last 10 years: No If all of the above answers are NO, then may proceed with Cephalosporin use.   Penicillins Hives    Has patient had a PCN reaction causing immediate rash, facial/tongue/throat swelling, SOB or lightheadedness with hypotension: Yes Has patient had a PCN reaction causing severe rash involving mucus membranes or skin necrosis: Yes Has patient had a PCN reaction that required hospitalization: No Has patient had a PCN reaction occurring within the last 10 years: No If all of the above answers are  NO, then may proceed with Cephalosporin use.    Sulfa Antibiotics Hives  [2]  Current Outpatient Medications on File Prior to Visit  Medication Sig Dispense Refill   cyclobenzaprine  (FLEXERIL ) 5 MG tablet Take 5-10 mg by mouth at bedtime.     NIKKI 3-0.02 MG tablet Take 1 tablet by mouth daily.     tretinoin  (RETIN-A ) 0.05 % cream Apply a pea-sized amount to face at bedtime, wash off in morning. 20 g 11   No current facility-administered medications on file prior to visit.   "

## 2025-10-11 ENCOUNTER — Ambulatory Visit: Admitting: Dermatology
